# Patient Record
Sex: Female | Born: 1955 | Race: White | Hispanic: No | Marital: Single | Smoking: Current every day smoker
Health system: Southern US, Community
[De-identification: ages and names within clinical notes are randomized; demographics above are authoritative.]

## PROBLEM LIST (undated history)

## (undated) DIAGNOSIS — I10 Essential (primary) hypertension: Secondary | ICD-10-CM

## (undated) DIAGNOSIS — G8929 Other chronic pain: Secondary | ICD-10-CM

## (undated) DIAGNOSIS — M48062 Spinal stenosis, lumbar region with neurogenic claudication: Secondary | ICD-10-CM

## (undated) DIAGNOSIS — F17200 Nicotine dependence, unspecified, uncomplicated: Secondary | ICD-10-CM

## (undated) DIAGNOSIS — R0600 Dyspnea, unspecified: Secondary | ICD-10-CM

## (undated) DIAGNOSIS — J45909 Unspecified asthma, uncomplicated: Secondary | ICD-10-CM

## (undated) DIAGNOSIS — M4180 Other forms of scoliosis, site unspecified: Secondary | ICD-10-CM

## (undated) DIAGNOSIS — M51369 Other intervertebral disc degeneration, lumbar region without mention of lumbar back pain or lower extremity pain: Secondary | ICD-10-CM

## (undated) DIAGNOSIS — G8918 Other acute postprocedural pain: Secondary | ICD-10-CM

## (undated) DIAGNOSIS — M5136 Other intervertebral disc degeneration, lumbar region: Secondary | ICD-10-CM

## (undated) DIAGNOSIS — M1990 Unspecified osteoarthritis, unspecified site: Secondary | ICD-10-CM

## (undated) DIAGNOSIS — R252 Cramp and spasm: Secondary | ICD-10-CM

## (undated) DIAGNOSIS — Z9889 Other specified postprocedural states: Secondary | ICD-10-CM

## (undated) DIAGNOSIS — K219 Gastro-esophageal reflux disease without esophagitis: Secondary | ICD-10-CM

## (undated) DIAGNOSIS — J449 Chronic obstructive pulmonary disease, unspecified: Secondary | ICD-10-CM

## (undated) DIAGNOSIS — F419 Anxiety disorder, unspecified: Secondary | ICD-10-CM

## (undated) DIAGNOSIS — M5416 Radiculopathy, lumbar region: Secondary | ICD-10-CM

## (undated) DIAGNOSIS — M47816 Spondylosis without myelopathy or radiculopathy, lumbar region: Secondary | ICD-10-CM

## (undated) DIAGNOSIS — D649 Anemia, unspecified: Secondary | ICD-10-CM

## (undated) HISTORY — PX: OTHER SURGICAL HISTORY: SHX169

## (undated) HISTORY — PX: COLONOSCOPY: SHX174

## (undated) HISTORY — PX: FOOT SURGERY: SHX648

## (undated) HISTORY — PX: ABDOMINAL HYSTERECTOMY: SHX81

## (undated) HISTORY — PX: ESOPHAGOGASTRODUODENOSCOPY: SHX1529

## (undated) HISTORY — PX: HERNIA REPAIR: SHX51

## (undated) HISTORY — PX: DENTAL SURGERY: SHX609

## (undated) HISTORY — DX: Anxiety disorder, unspecified: F41.9

## (undated) HISTORY — PX: ROTATOR CUFF REPAIR: SHX139

---

## 2008-04-08 ENCOUNTER — Emergency Department: Payer: Self-pay | Admitting: Emergency Medicine

## 2009-05-11 ENCOUNTER — Emergency Department: Payer: Self-pay | Admitting: Unknown Physician Specialty

## 2009-09-01 ENCOUNTER — Inpatient Hospital Stay: Payer: Self-pay | Admitting: Internal Medicine

## 2009-09-19 ENCOUNTER — Ambulatory Visit: Payer: Self-pay | Admitting: Family Medicine

## 2011-10-20 ENCOUNTER — Emergency Department: Payer: Self-pay | Admitting: Emergency Medicine

## 2012-09-22 ENCOUNTER — Emergency Department: Payer: Self-pay | Admitting: Internal Medicine

## 2012-09-22 ENCOUNTER — Emergency Department: Payer: Self-pay | Admitting: Emergency Medicine

## 2012-09-22 LAB — CBC
HCT: 39.5 % (ref 35.0–47.0)
Platelet: 249 10*3/uL (ref 150–440)
RBC: 3.98 10*6/uL (ref 3.80–5.20)
WBC: 7.3 10*3/uL (ref 3.6–11.0)

## 2012-09-22 LAB — BASIC METABOLIC PANEL
Anion Gap: 9 (ref 7–16)
Co2: 24 mmol/L (ref 21–32)
Creatinine: 0.69 mg/dL (ref 0.60–1.30)
EGFR (Non-African Amer.): >60
Glucose: 89 mg/dL (ref 65–99)
Potassium: 3.9 mmol/L (ref 3.5–5.1)
Sodium: 146 mmol/L — ABNORMAL HIGH (ref 136–145)

## 2012-09-22 LAB — CK TOTAL AND CKMB (NOT AT ARMC): CK-MB: 1.3 ng/mL (ref 0.5–3.6)

## 2012-09-22 LAB — TROPONIN I: Troponin-I: <0.02 ng/mL

## 2012-10-12 ENCOUNTER — Inpatient Hospital Stay: Payer: Self-pay | Admitting: Internal Medicine

## 2012-10-12 LAB — CBC WITH DIFFERENTIAL/PLATELET
Basophil #: 0.1 10*3/uL (ref 0.0–0.1)
Eosinophil #: 0 10*3/uL (ref 0.0–0.7)
Eosinophil %: 0.3 %
HCT: 39.3 % (ref 35.0–47.0)
HGB: 13.2 g/dL (ref 12.0–16.0)
Lymphocyte #: 1.6 10*3/uL (ref 1.0–3.6)
Lymphocyte %: 11.1 %
MCHC: 33.7 g/dL (ref 32.0–36.0)
MCV: 100 fL (ref 80–100)
Monocyte %: 6.2 %
Neutrophil #: 11.9 10*3/uL — ABNORMAL HIGH (ref 1.4–6.5)
Neutrophil %: 81.7 %
Platelet: 257 10*3/uL (ref 150–440)
RBC: 3.95 10*6/uL (ref 3.80–5.20)
RDW: 12.7 % (ref 11.5–14.5)
WBC: 14.6 10*3/uL — ABNORMAL HIGH (ref 3.6–11.0)

## 2012-10-12 LAB — BASIC METABOLIC PANEL
Anion Gap: 7 (ref 7–16)
BUN: 12 mg/dL (ref 7–18)
Creatinine: 0.76 mg/dL (ref 0.60–1.30)
EGFR (Non-African Amer.): >60
Glucose: 93 mg/dL (ref 65–99)
Osmolality: 279 (ref 275–301)
Potassium: 3.6 mmol/L (ref 3.5–5.1)
Sodium: 140 mmol/L (ref 136–145)

## 2012-10-12 LAB — URINALYSIS, COMPLETE
Bacteria: NONE SEEN
Bilirubin,UR: NEGATIVE
Ph: 5 (ref 4.5–8.0)
WBC UR: 1 /HPF (ref 0–5)

## 2012-10-13 LAB — CBC WITH DIFFERENTIAL/PLATELET
Basophil #: 0 10*3/uL (ref 0.0–0.1)
Eosinophil #: 0 10*3/uL (ref 0.0–0.7)
Eosinophil %: 0 %
HGB: 12.2 g/dL (ref 12.0–16.0)
Lymphocyte #: 0.6 10*3/uL — ABNORMAL LOW (ref 1.0–3.6)
MCHC: 32.5 g/dL (ref 32.0–36.0)
Monocyte #: 0.7 x10 3/mm (ref 0.2–0.9)
Neutrophil #: 9.7 10*3/uL — ABNORMAL HIGH (ref 1.4–6.5)
RBC: 3.69 10*6/uL — ABNORMAL LOW (ref 3.80–5.20)
WBC: 11.1 10*3/uL — ABNORMAL HIGH (ref 3.6–11.0)

## 2012-10-13 LAB — BASIC METABOLIC PANEL
Calcium, Total: 9.4 mg/dL (ref 8.5–10.1)
Chloride: 110 mmol/L — ABNORMAL HIGH (ref 98–107)
Co2: 23 mmol/L (ref 21–32)
Creatinine: 0.66 mg/dL (ref 0.60–1.30)
EGFR (African American): >60
Potassium: 4 mmol/L (ref 3.5–5.1)
Sodium: 142 mmol/L (ref 136–145)

## 2013-02-07 ENCOUNTER — Ambulatory Visit: Payer: Self-pay

## 2013-02-15 ENCOUNTER — Ambulatory Visit: Payer: Self-pay

## 2014-03-09 ENCOUNTER — Ambulatory Visit: Payer: Self-pay | Admitting: Family Medicine

## 2014-09-12 ENCOUNTER — Emergency Department: Payer: Self-pay | Admitting: Emergency Medicine

## 2014-09-12 LAB — BASIC METABOLIC PANEL
Anion Gap: 8 (ref 7–16)
BUN: 11 mg/dL (ref 7–18)
CHLORIDE: 108 mmol/L — AB (ref 98–107)
CO2: 27 mmol/L (ref 21–32)
Calcium, Total: 8.7 mg/dL (ref 8.5–10.1)
Creatinine: 0.69 mg/dL (ref 0.60–1.30)
EGFR (African American): >60
GLUCOSE: 87 mg/dL (ref 65–99)
Osmolality: 284 (ref 275–301)
Potassium: 3.9 mmol/L (ref 3.5–5.1)
Sodium: 143 mmol/L (ref 136–145)

## 2014-09-12 LAB — CBC
HCT: 39.6 % (ref 35.0–47.0)
HGB: 12.9 g/dL (ref 12.0–16.0)
MCH: 31.7 pg (ref 26.0–34.0)
MCHC: 32.5 g/dL (ref 32.0–36.0)
MCV: 98 fL (ref 80–100)
Platelet: 237 10*3/uL (ref 150–440)
RBC: 4.05 10*6/uL (ref 3.80–5.20)
RDW: 13.5 % (ref 11.5–14.5)
WBC: 6.6 10*3/uL (ref 3.6–11.0)

## 2014-09-12 LAB — TROPONIN I
Troponin-I: <0.02 ng/mL
Troponin-I: <0.02 ng/mL

## 2015-03-19 NOTE — Discharge Summary (Signed)
PATIENT NAME:  Sandra Strong, Sandra Strong MR#:  [id number] DATE OF BIRTH:  Apr 04, 1956  DATE OF ADMISSION:  10/12/2012 DATE OF DISCHARGE:  10/15/2012  PRIMARY CARE PHYSICIAN: [facility], Dr. Greggory StallionGeorge   FINAL DIAGNOSES:  1. Chronic obstructive pulmonary disease exacerbation.  2. Pneumonia.  3. Tobacco abuse.  4. Gastroesophageal reflux disease.  5. Allergies.  6. Anxiety.   MEDICATIONS ON DISCHARGE:  1. Acetaminophen 500 mg 2 tablets every six hours as needed for pain. 2. Combivent 2 puffs 4 times a day. 3. Flonase 50 mcg/inhalation two sprays once a day. 4. Mobic 7.5 mg daily.  5. Omeprazole 40 mg at bedtime.  6. Zoloft 100 mg at bedtime.  7. Caffeine 200 mg twice a day as needed to stay awake.  8. Tramadol 50 mg 1 to 2 tablets every 6 to 8 hours as needed for pain.  9. Cranberry 1 capsule twice a day. 10. Albuterol nebulizer 3 mL every six hours as needed for shortness of breath.  11. Loratadine 10 mg daily.  12. Methocarbamol 500 mg every eight hours as needed for spasms.  13. Prednisone taper 5 mg 4 tablets day one, 3 tablets day two and three, 2 tablets day four and five, 1 tablet day six and seven. 14. Levaquin 750 mg every 24 hours for five more days.  15. Flovent 110 mcg 1 inhalation twice a day.  16. Nicotine patch 14 mg transdermal patch daily.   DIET: Low sodium diet, regular consistency.   ACTIVITY: Activity as tolerated.   FOLLOW-UP: [facility] to [facility], Dr. Greggory StallionGeorge.   REASON FOR ADMISSION: The patient was admitted 10/12/2012 and discharged 10/15/2012, came in with shortness of breath and cough.   HISTORY OF PRESENT ILLNESS: The patient is a [age] year old female who presented with shortness of breath and cough for the past few weeks. She is on prednisone taper and doxycycline. She was found to have an upper lobe pneumonia. The patient was placed on IV steroids and IV Levaquin.   LABORATORY AND RADIOLOGICAL DATA DURING THE HOSPITAL COURSE:  Blood cultures negative for 48 hours. Urine culture mixed bacterial organisms. Urinalysis trace leukocyte esterase. White blood cell count 14.6, hemoglobin and hematocrit 13.2 and 39.3, platelet count 257, glucose 93, BUN 12, creatinine 0.76, sodium 140, potassium 3.6, chloride 108, CO2 25, calcium 9.2.   Chest x-ray showed left upper lobe pneumonia.   EKG showed sinus tachycardia, left atrial enlargement.   HOSPITAL COURSE PER PROBLEM LIST:  1. For the patient's COPD exacerbation and pneumonia, the patient was feeling much better and wanted to go home. On the day of discharge I still heard some wheezing in the lungs. She does have her usual Combivent at home and needed to add Flovent to complete a course of antibiotic with Levaquin and a prednisone taper. The care manager secondary to cost reasons filled the script for the Levaquin. The [facility]. At this point I think the Spiriva would be too expensive and she was kept on her Combivent. I did put the patient on Flovent and labeled the inhaler prior to going home.  2. Tobacco abuse. I think this is the major cause of her continued issue. She must stop smoking. Nicotine patch applied. Smoking cessation counseling done three minutes by me.  3. Gastroesophageal reflux disease. She is on omeprazole.  4. Allergies. She is on loratadine and Flonase.  5. Anxiety. She is on Zoloft.  TIME SPENT ON DISCHARGE: 35 minutes.   ____________________________ Herschell Dimes. Renae Gloss, MD rjw:drc D: 10/15/2012 14:49:30 ET T: 10/17/2012 10:50:12 ET JOB#: [id number]  cc: Herschell Dimes. Renae Gloss, MD, <Dictator> Angus Palms, MD Salley Scarlet MD ELECTRONICALLY SIGNED 10/31/2012 17:21

## 2015-03-19 NOTE — H&P (Signed)
PATIENT NAME:  Sandra Strong, [place] MR#:  [id number] DATE OF BIRTH:  1956/02/12  DATE OF ADMISSION:  10/12/2012  PRIMARY CARE PHYSICIAN: Dr. Gregg[facility] StallionGeorge [facility]  CHIEF COMPLAINT: Shortness of breath and cough.   HISTORY OF PRESENT ILLNESS: This is a 59 year old female who presents to the hospital secondary to chronic obstructive pulmonary disease exacerbation and cough, shortness of breath ongoing for the past few weeks. Patient apparently has been having some exertional shortness of breath and a cough with productive sputum now ongoing for the past few weeks. She was started as an outpatient on a prednisone taper along with amoxicillin and doxycycline. Despite being on antibiotics and steroids her clinical symptoms have not improved and she continues to have worsening shortness of breath. She therefore came to the ER for further evaluation. She was noted to be significantly hypoxic and also was noted to be tachycardic even on minimal exertion. Chest x-ray revealed a left upper lobe pneumonia and she was noted to be in chronic obstructive pulmonary disease exacerbation. Hospitalist service contacted for further treatment and evaluation.   REVIEW OF SYSTEMS: CONSTITUTIONAL: No documented fever. No weight gain. No weight loss. EYES: No blurred or double vision. ENT: No tinnitus. Positive postnasal drip. No redness of the oropharynx. RESPIRATORY: Positive cough. Positive wheeze. No hemoptysis. Positive dyspnea on exertion. Positive chronic obstructive pulmonary disease. CARDIOVASCULAR: No chest pain, no orthopnea, no palpitations, no syncope. GASTROINTESTINAL: Positive nausea. No vomiting, no diarrhea, no abdominal pain, no melena, no hematochezia. GENITOURINARY: No dysuria, no hematuria. ENDOCRINE: No polyuria or nocturia. No heat or cold intolerance. HEME: No anemia, no bruising, no bleeding. INTEGUMENTARY: No rashes. No lesions. MUSCULOSKELETAL: No arthritis, no swelling, no gout. NEUROLOGIC: No  numbness, no tingling, no ataxia, no seizure-type activity. PSYCH: No anxiety, no insomnia, no ADD.   PAST MEDICAL HISTORY:  1. Gastroesophageal reflux disease.  2. Arthritis. 3. Chronic obstructive pulmonary disease with ongoing tobacco abuse.  4. Allergic rhinitis.   ALLERGIES: Codeine, erythromycin and sulfa.   SOCIAL HISTORY: Still smokes about a pack per day. Does have a 30 to 40 pack-year smoking history. No alcohol abuse. No illicit drug abuse. Lives at home by herself.   FAMILY HISTORY: Significant for malignancy. Her mother died from a malignancy that she is unheard of. Father had pneumonia and also died from colon cancer.   CURRENT MEDICATIONS:  1. Tylenol 500 mg 2 tabs q.6 hours as needed.  2. Albuterol nebulizer q.6 hours as needed  3. Combivent 2 puffs 4 times daily.  4. Caffeine 200 mg 1 tab b.i.d. as needed.  5. Cranberry oral capsule 1 tab b.i.d.  6. Flonase two sprays to each nostril daily. 7. Loratadine 10 mg daily.  8. Methocarbamol 500 mg q.8 hours as needed.  9. Mobic 7.5 mg daily.  10. Omeprazole 40 mg daily.  11. Prednisone 20 mg b.i.d. to be stopped on 10/14/2012. 12. Albuterol inhaler 2 puffs q.4-6h. as needed.  13. Zoloft 100 mg daily.  14. Tramadol 50 mg q.6-8 hours as needed.   PHYSICAL EXAMINATION:   VITAL SIGNS: Patient's vital signs are noted to be: Temperature 98.3, pulse 118, respirations 24, blood pressure 156/87, sats 95% on 2 liters nasal cannula.   GENERAL: She is a pleasant appearing female in mild respiratory distress.   HEENT: She is atraumatic, normocephalic. Her extraocular muscles are intact. Pupils equal, reactive to light. Sclerae anicteric. No conjunctival injection. No pharyngeal erythema.   NECK: Supple. There is no jugular venous distention, no  bruits, no lymphadenopathy, no thyromegaly.   HEART: Regular rate and rhythm. Tachycardic. No murmurs, no rubs, no clicks.   LUNGS: She has diffuse inspiratory and expiratory wheezes.  Positive use of accessory muscles. No dullness to percussion.   ABDOMEN: Soft, flat, nontender, nondistended. Has good bowel sounds. No hepatosplenomegaly appreciated.   EXTREMITIES: No evidence of any cyanosis, clubbing, or peripheral edema. Has +2 pedal and radial pulses bilaterally.   NEUROLOGICAL: Patient is alert, awake, oriented x3 with no focal motor or sensory deficits appreciated bilaterally.   SKIN: Moist, warm with no rash appreciated.   LYMPHATIC: There is no cervical or axillary lymphadenopathy.   LABORATORY, DIAGNOSTIC, AND RADIOLOGICAL DATA: Serum glucose 93, BUN 12, creatinine 0.7, sodium 140, potassium 3.6, chloride 108, bicarbonate 25, white cell count 14.6, hemoglobin 13.2, hematocrit 39.3, platelet count 257. Urinalysis is within normal limits.   Patient did have a chest x-ray done which showed left upper lobe infiltrate consistent with pneumonia with some mild underlying edema.   ASSESSMENT AND PLAN: This is a 59 year old female with a history of chronic obstructive pulmonary disease with ongoing tobacco abuse, gastroesophageal reflux disease, arthritis, history of allergic rhinitis presents to the hospital due to shortness of breath, cough and noted to be in chronic obstructive pulmonary disease exacerbation with underlying pneumonia having failed outpatient therapy.  1. Chronic obstructive pulmonary disease exacerbation. This is likely secondary to the left upper lobe pneumonia and has not improved with outpatient therapy with p.o. doxycycline, amoxicillin and prednisone taper. I will start her on IV steroids, put her on DuoNebs around-the-clock, add some Spiriva, continue IV Levaquin for pneumonia and follow her clinically. Will also assess her for home oxygen prior to discharge.  2. Pneumonia. This is likely community-acquired pneumonia. Has failed outpatient therapy with p.o. amoxicillin and doxycycline. Will start her on IV Levaquin, follow sputum and blood cultures.   3. Gastroesophageal reflux disease. Continue omeprazole. 4. Arthritis. Continue Mobic and tramadol.  5. Leukocytosis, likely secondary to the pneumonia, also complicated with underlying use of steroids. Will follow her white cell count with treatment with IV antibiotics.  6. Tobacco abuse. Place her on a nicotine patch.  7. CODE STATUS: Patient is a FULL CODE.   TIME SPENT WITH ADMISSION: 50 minutes.  ____________________________ Rolly Pancake. Cherlynn Kaiser, MD vjs:cms D: 10/12/2012 10:30:33 ET T: 10/12/2012 10:49:18 ET JOB#: [id number]  cc: Rolly Pancake. Cherlynn Kaiser, MD, <Dictator> Angus Palms, MD [place] Siren MD ELECTRONICALLY SIGNED 10/13/2012 8:12

## 2016-02-24 ENCOUNTER — Other Ambulatory Visit: Payer: Self-pay | Admitting: Family Medicine

## 2016-02-24 DIAGNOSIS — Z1231 Encounter for screening mammogram for malignant neoplasm of breast: Secondary | ICD-10-CM

## 2016-03-19 ENCOUNTER — Ambulatory Visit
Admission: RE | Admit: 2016-03-19 | Discharge: 2016-03-19 | Disposition: A | Discharge status: Home / Self Care | Payer: Managed Care, Other (non HMO) | Source: Ambulatory Visit | Attending: Family Medicine | Admitting: Family Medicine

## 2016-03-19 DIAGNOSIS — Z1231 Encounter for screening mammogram for malignant neoplasm of breast: Secondary | ICD-10-CM | POA: Diagnosis present

## 2016-03-26 ENCOUNTER — Other Ambulatory Visit: Payer: Self-pay | Admitting: Family Medicine

## 2016-03-26 DIAGNOSIS — R928 Other abnormal and inconclusive findings on diagnostic imaging of breast: Secondary | ICD-10-CM

## 2016-04-07 ENCOUNTER — Ambulatory Visit
Admission: RE | Admit: 2016-04-07 | Discharge: 2016-04-07 | Disposition: A | Discharge status: Home / Self Care | Payer: Managed Care, Other (non HMO) | Source: Ambulatory Visit | Attending: Family Medicine | Admitting: Family Medicine

## 2016-04-07 DIAGNOSIS — R928 Other abnormal and inconclusive findings on diagnostic imaging of breast: Secondary | ICD-10-CM | POA: Diagnosis not present

## 2017-03-20 ENCOUNTER — Emergency Department
Admission: EM | Admit: 2017-03-20 | Discharge: 2017-03-20 | Disposition: A | Discharge status: Home / Self Care | Payer: 59 | Attending: Emergency Medicine | Admitting: Emergency Medicine

## 2017-03-20 ENCOUNTER — Encounter: Payer: Self-pay | Admitting: Emergency Medicine

## 2017-03-20 ENCOUNTER — Emergency Department: Payer: 59

## 2017-03-20 DIAGNOSIS — J441 Chronic obstructive pulmonary disease with (acute) exacerbation: Secondary | ICD-10-CM

## 2017-03-20 DIAGNOSIS — J45909 Unspecified asthma, uncomplicated: Secondary | ICD-10-CM | POA: Diagnosis not present

## 2017-03-20 DIAGNOSIS — F17200 Nicotine dependence, unspecified, uncomplicated: Secondary | ICD-10-CM | POA: Diagnosis not present

## 2017-03-20 DIAGNOSIS — J181 Lobar pneumonia, unspecified organism: Secondary | ICD-10-CM | POA: Diagnosis not present

## 2017-03-20 DIAGNOSIS — I10 Essential (primary) hypertension: Secondary | ICD-10-CM | POA: Insufficient documentation

## 2017-03-20 DIAGNOSIS — R0602 Shortness of breath: Secondary | ICD-10-CM | POA: Diagnosis present

## 2017-03-20 DIAGNOSIS — J189 Pneumonia, unspecified organism: Secondary | ICD-10-CM

## 2017-03-20 HISTORY — DX: Chronic obstructive pulmonary disease, unspecified: J44.9

## 2017-03-20 HISTORY — DX: Gastro-esophageal reflux disease without esophagitis: K21.9

## 2017-03-20 HISTORY — DX: Essential (primary) hypertension: I10

## 2017-03-20 HISTORY — DX: Unspecified osteoarthritis, unspecified site: M19.90

## 2017-03-20 HISTORY — DX: Unspecified asthma, uncomplicated: J45.909

## 2017-03-20 LAB — BASIC METABOLIC PANEL
ANION GAP: 10 (ref 5–15)
BUN: 14 mg/dL (ref 6–20)
CALCIUM: 9.6 mg/dL (ref 8.9–10.3)
CO2: 25 mmol/L (ref 22–32)
Chloride: 105 mmol/L (ref 101–111)
Creatinine, Ser: 0.66 mg/dL (ref 0.44–1.00)
Glucose, Bld: 98 mg/dL (ref 65–99)
Potassium: 3.2 mmol/L — ABNORMAL LOW (ref 3.5–5.1)
SODIUM: 140 mmol/L (ref 135–145)

## 2017-03-20 LAB — CBC
HEMATOCRIT: 39.9 % (ref 35.0–47.0)
HEMOGLOBIN: 13.4 g/dL (ref 12.0–16.0)
MCH: 31.3 pg (ref 26.0–34.0)
MCHC: 33.6 g/dL (ref 32.0–36.0)
MCV: 93 fL (ref 80.0–100.0)
Platelets: 330 10*3/uL (ref 150–440)
RBC: 4.29 MIL/uL (ref 3.80–5.20)
RDW: 13.6 % (ref 11.5–14.5)
WBC: 18 10*3/uL — AB (ref 3.6–11.0)

## 2017-03-20 LAB — TROPONIN I

## 2017-03-20 MED ORDER — LEVOFLOXACIN 500 MG PO TABS: 500.0000 mg | ORAL_TABLET | Freq: Every day | ORAL | 0 refills | Status: AC

## 2017-03-20 MED ORDER — ALBUTEROL SULFATE (2.5 MG/3ML) 0.083% IN NEBU
5.0000 mg | INHALATION_SOLUTION | Freq: Once | RESPIRATORY_TRACT | Status: AC
  Administered 2017-03-20: 5 mg via RESPIRATORY_TRACT
  Filled 2017-03-20: qty 6

## 2017-03-20 MED ORDER — IPRATROPIUM-ALBUTEROL 0.5-2.5 (3) MG/3ML IN SOLN
3.0000 mL | Freq: Once | RESPIRATORY_TRACT | Status: AC
Start: 2017-03-20 — End: 2017-03-20
  Administered 2017-03-20: 3 mL via RESPIRATORY_TRACT
  Filled 2017-03-20: qty 3

## 2017-03-20 MED ORDER — LEVOFLOXACIN 500 MG PO TABS
500.0000 mg | ORAL_TABLET | Freq: Once | ORAL | Status: AC
  Administered 2017-03-20: 500 mg via ORAL
  Filled 2017-03-20: qty 1

## 2017-03-20 NOTE — ED Provider Notes (Signed)
[facility] Emergency Department Provider Note   ____________________________________________    I have reviewed the triage vital signs and the nursing notes.   HISTORY  Chief Complaint Shortness of Breath     HPI Sandra Strong is a 61 y.o. female who presents with complaints of shortness of breath. Patient reports 2 weeks of soreness of breath and cough. No recent travel. No chest pain. Recently completed course of prednisone started by her PCP. No improvement. No fevers reported no chest pain. Patient does smoke cigarettes   Past Medical History:  Diagnosis Date  . Arthritis   . Asthma   . COPD (chronic obstructive pulmonary disease) (HCC)   . GERD (gastroesophageal reflux disease)   . Hypertension     There are no active problems to display for this patient.   History reviewed. No pertinent surgical history.  Prior to Admission medications   Medication Sig Start Date End Date Taking? Authorizing Provider  levofloxacin (LEVAQUIN) 500 MG tablet Take 1 tablet (500 mg total) by mouth daily. 03/20/17 03/30/17  Jene Every, MD     Allergies Erythromycin and Sulfa antibiotics  Family History  Problem Relation Age of Onset  . Breast cancer Mother 86  . Breast cancer Paternal Aunt     Social History Social History  Substance Use Topics  . Smoking status: Current Every Day Smoker  . Smokeless tobacco: Never Used  . Alcohol use No    Review of Systems  Constitutional: No fever/chills  Cardiovascular: Denies chest pain. Respiratory: As above Gastrointestinal: No abdominal pain.   Musculoskeletal: Negative for back pain. Marland Kitchen   10-point ROS otherwise negative.  ____________________________________________   PHYSICAL EXAM:  VITAL SIGNS: ED Triage Vitals [03/20/17 0617]  Enc Vitals Group     BP (!) 145/77     Pulse Rate 99     Resp (!) 24     Temp 98.2 F (36.8 C)     Temp Source Oral     SpO2 94 %     Weight 195 lb  (88.5 kg)     Height  (1.575 m)     Head Circumference      Peak Flow      Pain Score 2     Pain Loc      Pain Edu?      Excl. in GC?     Constitutional: Alert and oriented. No acute distress. Pleasant and interactive Eyes: Conjunctivae are normal.   Nose: No congestion/rhinnorhea. Mouth/Throat: Mucous membranes are moist.    Cardiovascular: Normal rate, regular rhythm. Grossly normal heart sounds.  Good peripheral circulation. Respiratory: Normal respiratory effort.  No retractions. Mild scattered wheezes  Genitourinary: deferred Musculoskeletal: No lower extremity tenderness nor edema.  Warm and well perfused Neurologic:  Normal speech and language. No gross focal neurologic deficits are appreciated.  Skin:  Skin is warm, dry and intact. No rash noted. Psychiatric: Mood and affect are normal. Speech and behavior are normal.  ____________________________________________   LABS (all labs ordered are listed, but only abnormal results are displayed)  Labs Reviewed  BASIC METABOLIC PANEL - Abnormal; Notable for the following:       Result Value   Potassium 3.2 (*)    All other components within normal limits  CBC - Abnormal; Notable for the following:    WBC 18.0 (*)    All other components within normal limits  TROPONIN I   ____________________________________________  EKG  ED ECG REPORT I,  Jene Every, the attending physician, personally viewed and interpreted this ECG.  Date: 03/20/2017 EKG Time: 6:14 AM Rate: 91 Rhythm: normal sinus rhythm QRS Axis: normal Intervals: normal ST/T Wave abnormalities: normal Conduction Disturbances: none Narrative Interpretation: unremarkable  ____________________________________________  RADIOLOGY  Chest x-ray demonstrates likely lingular infiltrate ____________________________________________   PROCEDURES  Procedure(s) performed: No    Critical Care  performed:No ____________________________________________   INITIAL IMPRESSION / ASSESSMENT AND PLAN / ED COURSE  Pertinent labs & imaging results that were available during my care of the patient were reviewed by me and considered in my medical decision making (see chart for details).  Patient presents with shortness of breath and cough. History of COPD, recently finished prednisone but no improvement. Given DuoNeb in the emergency department with significant relief. Elevated white blood cell count likely related to prednisone course.  ----------------------------------------- 8:00 AM on 03/20/2017 -----------------------------------------  Chest x-ray demonstrates possible infiltrate in the lingula. However patient feels significantly better and is very well-appearing. She is quite ready to go home. We will treat her with Levaquin by mouth, she is to return if any worsening symptoms    ____________________________________________   FINAL CLINICAL IMPRESSION(S) / ED DIAGNOSES  Final diagnoses:  COPD exacerbation (HCC)  Community acquired pneumonia of left lower lobe of lung (HCC)      NEW MEDICATIONS STARTED DURING THIS VISIT:  New Prescriptions   LEVOFLOXACIN (LEVAQUIN) 500 MG TABLET    Take 1 tablet (500 mg total) by mouth daily.     Note:  This document was prepared using Dragon voice recognition software and may include unintentional dictation errors.    Jene Every, MD 03/20/17 [phone]

## 2017-03-20 NOTE — ED Triage Notes (Signed)
Pt to triage via WC, reports SOB x 2 weeks, hx copd/asthma, has been using inhaler and nebulizer at home w/o relief.  Pt reports she just finished course of prednisone w/o improvement.

## 2017-04-14 ENCOUNTER — Ambulatory Visit: Payer: 59

## 2017-05-08 IMAGING — US US BREAST*R* LIMITED INC AXILLA
1 series · 2 of 2 positions shown · non-contrast
Comparison: Previous exam(s).

CLINICAL DATA: Patient recalled from screening for left breast
asymmetry and right axillary lymph node.

EXAM:
DIGITAL DIAGNOSTIC BILATERAL MAMMOGRAM WITH CAD
ULTRASOUND RIGHT BREAST

[Series 1: us breast*right* limited inc axilla · 0.07mm/px · 2 of 2 slices shown]
[im 1/2]
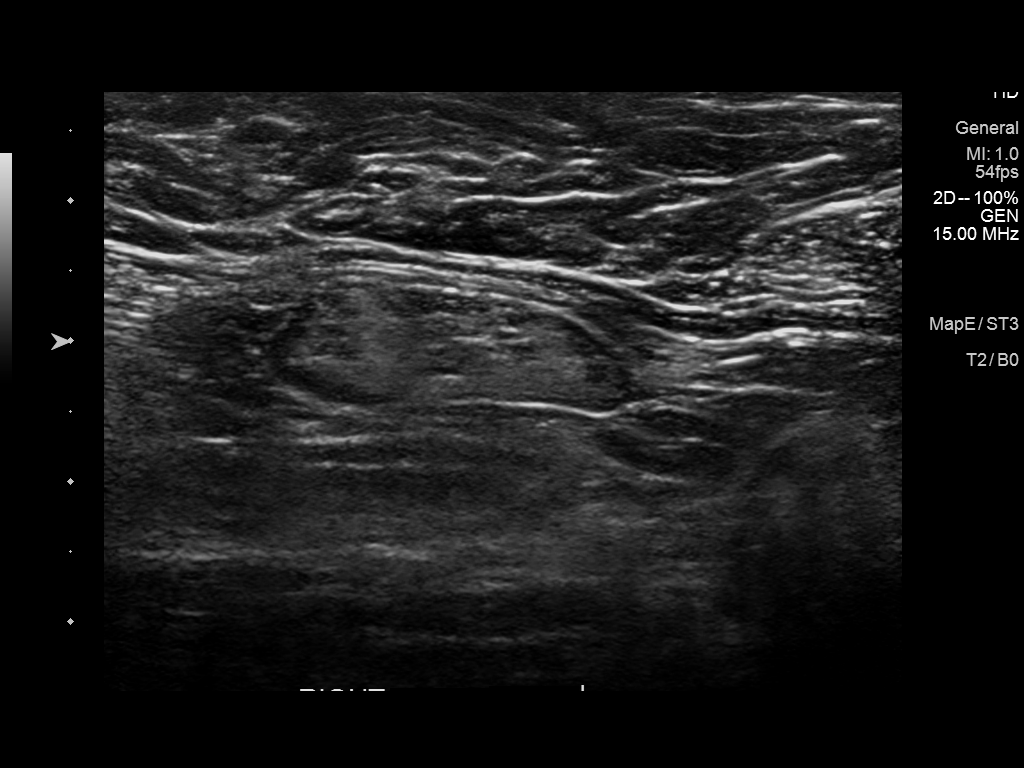
[im 2/2]
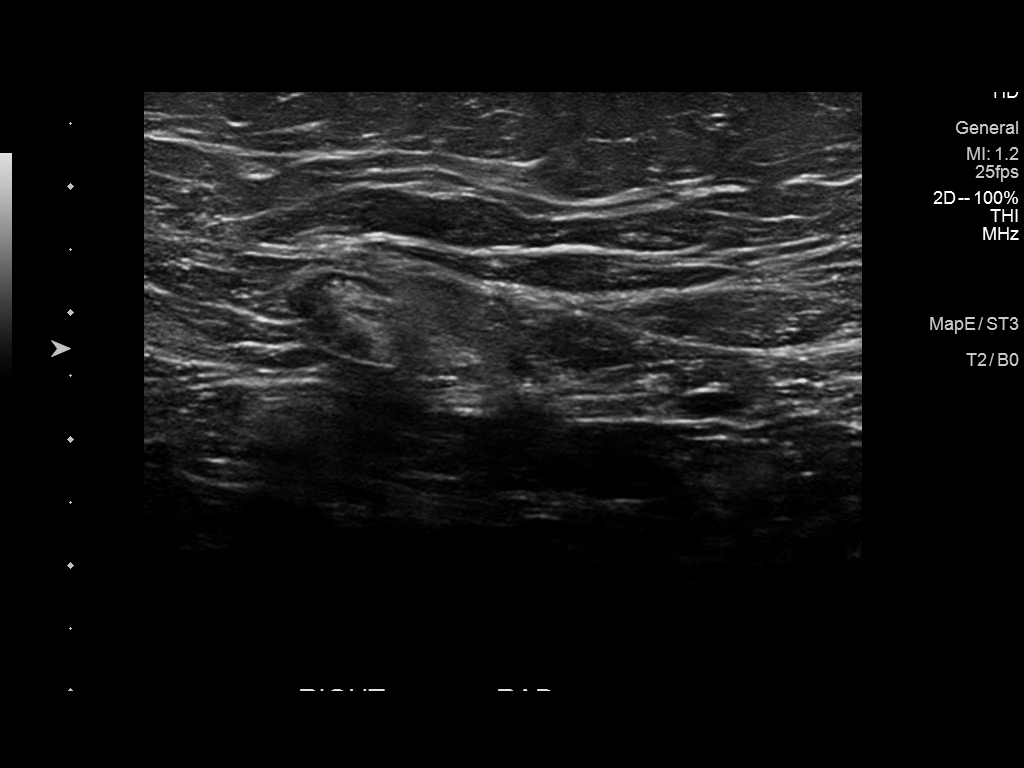

[2 of 2 positions shown; findings below may reference images not displayed]

ACR Breast Density Category b: There are scattered areas of
fibroglandular density.
FINDINGS: CC and MLO tomosynthesis images of the right and left breast were
obtained. The questioned asymmetry within the lateral left breast
resolved compatible with overlapping fibroglandular breast tissue.
Normal appearing right axillary lymph nodes with fatty hila were
demonstrated. No suspicious masses identified in either breast.

Mammographic images were processed with CAD.

On physical exam, I palpate no discrete mass within the right
axilla.

Targeted ultrasound is performed, showing normal right axillary
lymph nodes. No abnormal cortical thickening.
IMPRESSION: No mammographic evidence for malignancy within the right or left
breast. No right axillary adenopathy.

RECOMMENDATION:
Screening mammogram in one year.(Code:JW-[id number])

I have discussed the findings and recommendations with the patient.
Results were also provided in writing at the conclusion of the
visit. If applicable, a reminder letter will be sent to the patient
regarding the next appointment.

BI-RADS CATEGORY  1: Negative.

## 2017-05-12 ENCOUNTER — Encounter: Payer: Self-pay | Admitting: *Deleted

## 2017-05-12 ENCOUNTER — Ambulatory Visit
Admission: RE | Admit: 2017-05-12 | Discharge: 2017-05-12 | Disposition: A | Discharge status: Home / Self Care | Payer: 59 | Source: Ambulatory Visit | Attending: Oncology | Admitting: Oncology

## 2017-05-12 ENCOUNTER — Ambulatory Visit: Payer: 59 | Attending: Oncology | Admitting: *Deleted

## 2017-05-12 VITALS — BP 129/84 | HR 76 | Temp 97.1°F | Ht 62.0 in | Wt 197.0 lb

## 2017-05-12 DIAGNOSIS — Z0000 Encounter for general adult medical examination without abnormal findings: Secondary | ICD-10-CM

## 2017-05-12 NOTE — Patient Instructions (Signed)
Gave patient hand-out, Women Staying Healthy, Active and Well from [facility], with education on breast health, pap smears, heart and colon health. 

## 2017-05-12 NOTE — Progress Notes (Signed)
Subjective:     Patient ID: Sandra Strong, female   DOB: 05/22/1956, 61 y.o.   MRN: [id number]  HPI   Review of Systems     Objective:   Physical Exam  Pulmonary/Chest: Right breast exhibits no inverted nipple, no mass, no nipple discharge, no skin change and no tenderness. Left breast exhibits no inverted nipple, no mass, no nipple discharge, no skin change and no tenderness. Breasts are symmetrical.       Assessment:     61 year old White [facility].  Clinical breast exam unremarkable.  Taught self breast awareness.  Patient has a history of hysterectomy for post delivery problems.  States she has one ovary.  Patient refused pelvic exam today.  States she will follow-up with a gyn if she has "any bleeding, pain or other things".  Patient has difficulty ambulating.  States she has arthritis. Patient states she does have insurance, but it does not pay for a mammogram.  She meets financial eligibility for [facility].  Will include her insurance for initial payment and [facility] as secondary.    Plan:     Screening mammogram ordered.  Will follow-up per [facility] protocol.

## 2017-05-13 ENCOUNTER — Encounter: Payer: Self-pay | Admitting: *Deleted

## 2017-05-13 NOTE — Progress Notes (Signed)
Letter mailed from the [facility] to inform patient of her normal mammogram results.  Patient is to follow-up with annual screening in one year.  HSIS to Christy. 

## 2017-10-28 ENCOUNTER — Telehealth: Payer: Self-pay | Admitting: *Deleted

## 2017-10-28 DIAGNOSIS — Z87891 Personal history of nicotine dependence: Secondary | ICD-10-CM

## 2017-10-28 DIAGNOSIS — Z122 Encounter for screening for malignant neoplasm of respiratory organs: Secondary | ICD-10-CM

## 2017-10-28 NOTE — Telephone Encounter (Signed)
Received referral for low dose lung cancer screening CT scan. Message left at phone number listed in EMR for patient to call me back to facilitate scheduling scan.  

## 2017-10-28 NOTE — Telephone Encounter (Signed)
Received referral for initial lung cancer screening scan. Contacted patient and obtained smoking history,(current, 129 pack year) as well as answering questions related to screening process. Patient denies signs of lung cancer such as weight loss or hemoptysis. Patient denies comorbidity that would prevent curative treatment if lung cancer were found. Patient is scheduled for shared decision making visit and CT scan on 11/10/17.

## 2017-11-10 ENCOUNTER — Ambulatory Visit: Payer: 59

## 2017-11-10 ENCOUNTER — Encounter: Payer: Self-pay | Admitting: Oncology

## 2017-11-10 ENCOUNTER — Inpatient Hospital Stay: Payer: 59 | Admitting: Oncology

## 2017-12-03 ENCOUNTER — Telehealth: Payer: Self-pay

## 2017-12-03 ENCOUNTER — Telehealth: Payer: Self-pay | Admitting: *Deleted

## 2017-12-03 DIAGNOSIS — Z87891 Personal history of nicotine dependence: Secondary | ICD-10-CM

## 2017-12-03 DIAGNOSIS — Z122 Encounter for screening for malignant neoplasm of respiratory organs: Secondary | ICD-10-CM

## 2017-12-03 NOTE — Telephone Encounter (Signed)
Contacted patient regarding rescheduling lung screening patient is agreeable to reschedule on 12/28/17. See prior notes for eligibility information.

## 2017-12-03 NOTE — Telephone Encounter (Signed)
Copied from CRM [id number]. Topic: Inquiry >> Dec 03, 2017 11:06 AM Everardo Pacific[place], Kelly, NT wrote: Reason for CRM: Patient calling because [facility] that she needed to be seen in this office, but she has never been seen here before and patient doesn't want to move her care to our office. If someone could give her a call back at [phone] before 12. Has an appt for a CT Scan 12-28-2017

## 2017-12-24 ENCOUNTER — Other Ambulatory Visit: Payer: Self-pay | Admitting: Family Medicine

## 2017-12-24 DIAGNOSIS — Z780 Asymptomatic menopausal state: Secondary | ICD-10-CM

## 2017-12-28 ENCOUNTER — Inpatient Hospital Stay: Payer: 59 | Attending: Nurse Practitioner | Admitting: Nurse Practitioner

## 2017-12-28 ENCOUNTER — Ambulatory Visit
Admission: RE | Admit: 2017-12-28 | Discharge: 2017-12-28 | Disposition: A | Discharge status: Home / Self Care | Payer: 59 | Source: Ambulatory Visit | Attending: Nurse Practitioner | Admitting: Nurse Practitioner

## 2017-12-28 DIAGNOSIS — Z122 Encounter for screening for malignant neoplasm of respiratory organs: Secondary | ICD-10-CM | POA: Diagnosis not present

## 2017-12-28 DIAGNOSIS — J439 Emphysema, unspecified: Secondary | ICD-10-CM | POA: Diagnosis not present

## 2017-12-28 DIAGNOSIS — Z87891 Personal history of nicotine dependence: Secondary | ICD-10-CM

## 2017-12-28 NOTE — Progress Notes (Signed)
In accordance with CMS guidelines, patient has met eligibility criteria including age, absence of signs or symptoms of lung cancer.  Social History   Tobacco Use  . Smoking status: Current Every Day Smoker    Packs/day: 3.00    Years: 43.00    Pack years: 129.00    Start date: 05/12/1969  . Smokeless tobacco: Never Used  . Tobacco comment: Information on quiting smoking given to pt, 1.5ppd currrently  Substance Use Topics  . Alcohol use: No  . Drug use: Not on file     A shared decision-making session was conducted prior to the performance of CT scan. This includes one or more decision aids, includes benefits and harms of screening, follow-up diagnostic testing, over-diagnosis, false positive rate, and total radiation exposure.  Counseling on the importance of adherence to annual lung cancer LDCT screening, impact of co-morbidities, and ability or willingness to undergo diagnosis and treatment is imperative for compliance of the program.  Counseling on the importance of continued smoking cessation for former smokers; the importance of smoking cessation for current smokers, and information about tobacco cessation interventions have been given to patient including [place] and 1800 quit [place] programs.  Written order for lung cancer screening with LDCT has been given to the patient and any and all questions have been answered to the best of my abilities.   Yearly follow up will be coordinated by Burgess Estelle, Thoracic Navigator.  Beckey Rutter, DNP, AGNP-C [place] at [facility] [phone] (office) 12/28/17 3:01 PM

## 2017-12-31 ENCOUNTER — Telehealth: Payer: Self-pay | Admitting: *Deleted

## 2017-12-31 NOTE — Telephone Encounter (Signed)
Notified patient of LDCT lung cancer screening program results with recommendation for 12 month follow up imaging. Also notified of incidental findings noted below and is encouraged to discuss further with PCP who will receive a copy of this note and/or the CT report. Patient verbalizes understanding.   IMPRESSION: Lung-RADS 2, benign appearance or behavior. Continue annual screening with low-dose chest CT without contrast in 12 months 

## 2018-01-06 ENCOUNTER — Other Ambulatory Visit: Payer: Self-pay

## 2018-01-06 ENCOUNTER — Ambulatory Visit
Admission: RE | Admit: 2018-01-06 | Discharge: 2018-01-06 | Disposition: A | Discharge status: Home / Self Care | Payer: 59 | Source: Ambulatory Visit | Attending: Nurse Practitioner | Admitting: Nurse Practitioner

## 2018-01-06 ENCOUNTER — Encounter: Payer: Self-pay | Admitting: Nurse Practitioner

## 2018-01-06 ENCOUNTER — Ambulatory Visit: Payer: 59 | Admitting: Nurse Practitioner

## 2018-01-06 ENCOUNTER — Ambulatory Visit: Payer: 59 | Attending: Nurse Practitioner | Admitting: Nurse Practitioner

## 2018-01-06 DIAGNOSIS — J449 Chronic obstructive pulmonary disease, unspecified: Secondary | ICD-10-CM | POA: Insufficient documentation

## 2018-01-06 DIAGNOSIS — K219 Gastro-esophageal reflux disease without esophagitis: Secondary | ICD-10-CM | POA: Insufficient documentation

## 2018-01-06 DIAGNOSIS — M545 Low back pain, unspecified: Secondary | ICD-10-CM

## 2018-01-06 DIAGNOSIS — M1990 Unspecified osteoarthritis, unspecified site: Secondary | ICD-10-CM | POA: Insufficient documentation

## 2018-01-06 DIAGNOSIS — F1190 Opioid use, unspecified, uncomplicated: Secondary | ICD-10-CM | POA: Diagnosis not present

## 2018-01-06 DIAGNOSIS — Z79899 Other long term (current) drug therapy: Secondary | ICD-10-CM | POA: Insufficient documentation

## 2018-01-06 DIAGNOSIS — M419 Scoliosis, unspecified: Secondary | ICD-10-CM | POA: Diagnosis not present

## 2018-01-06 DIAGNOSIS — M25571 Pain in right ankle and joints of right foot: Secondary | ICD-10-CM | POA: Insufficient documentation

## 2018-01-06 DIAGNOSIS — G8929 Other chronic pain: Secondary | ICD-10-CM

## 2018-01-06 DIAGNOSIS — M5137 Other intervertebral disc degeneration, lumbosacral region: Secondary | ICD-10-CM | POA: Insufficient documentation

## 2018-01-06 DIAGNOSIS — M25572 Pain in left ankle and joints of left foot: Secondary | ICD-10-CM | POA: Insufficient documentation

## 2018-01-06 DIAGNOSIS — M19071 Primary osteoarthritis, right ankle and foot: Secondary | ICD-10-CM | POA: Diagnosis not present

## 2018-01-06 DIAGNOSIS — M7732 Calcaneal spur, left foot: Secondary | ICD-10-CM | POA: Diagnosis not present

## 2018-01-06 DIAGNOSIS — M25561 Pain in right knee: Secondary | ICD-10-CM

## 2018-01-06 DIAGNOSIS — M5136 Other intervertebral disc degeneration, lumbar region: Secondary | ICD-10-CM | POA: Diagnosis not present

## 2018-01-06 DIAGNOSIS — G894 Chronic pain syndrome: Secondary | ICD-10-CM | POA: Diagnosis not present

## 2018-01-06 DIAGNOSIS — Z789 Other specified health status: Secondary | ICD-10-CM | POA: Diagnosis not present

## 2018-01-06 DIAGNOSIS — I10 Essential (primary) hypertension: Secondary | ICD-10-CM | POA: Diagnosis not present

## 2018-01-06 DIAGNOSIS — M25562 Pain in left knee: Secondary | ICD-10-CM | POA: Insufficient documentation

## 2018-01-06 DIAGNOSIS — M899 Disorder of bone, unspecified: Secondary | ICD-10-CM | POA: Diagnosis not present

## 2018-01-06 NOTE — Progress Notes (Signed)
Patient's Name: Sandra Strong  MRN: [id number]  Referring Provider: Renata Caprice  DOB: Feb 16, 1956  PCP: [facility] [place]  DOS: 01/06/2018  Note by: Dionisio David NP  Service setting: Ambulatory outpatient  Specialty: Interventional Pain Management  Location: [facility] (AMB) Pain Management Facility    Patient type: New Patient    Primary Reason(s) for Visit: Initial Patient Evaluation CC: Back Pain (lower)  HPI  Sandra Strong is a 62 y.o. year old, female patient, who comes today for an initial evaluation. She has Chronic bilateral low back pain without sciatica (Primary Area of Pain)(B) (R>L); Chronic ankle pain, bilateral (Secondary Area of Pain) (R>L); Chronic pain of both knees (Tertiary Area of Pain) (L>R); Chronic pain syndrome; Opiate use; Pharmacologic therapy; Disorder of skeletal system; and Problems influencing health status on their problem list.. Her primarily concern today is the Back Pain (lower)  Pain Assessment: Location: Right, Left, Lower Back Radiating: groin, coccyx Onset: More than a month ago Duration: Chronic pain Quality: Aching(popping) Severity: 2 /10 (self-reported pain score)  Note: Reported level is compatible with observation.                          Effect on ADL:  Does well at home and working is the worst with the pain. Timing: Intermittent Modifying factors: sitting or lying down  Onset and Duration: Gradual Cause of pain: scoliosis Severity: Getting worse, NAS-11 at its worse: 10/10, NAS-11 at its best: 4/10, NAS-11 now: 10/10 and NAS-11 on the average: 5/10 Timing: During activity or exercise Aggravating Factors: Lifiting, Prolonged standing, Walking and Working Alleviating Factors: Lying down and Resting Associated Problems: Fatigue, Sweating, Swelling, Weakness and Pain that does not allow patient to sleep Quality of Pain: Aching, Pressure-like, Sharp and Throbbing Previous Examinations or Tests: X-rays and Orthopedic  evaluation Previous Treatments: The patient denies treatments  The patient comes into the clinics today for the first time for a chronic pain management evaluation. According to the patient her primary area of pain is in her lower back. She admits that the right side  greater than the left. She denies any previous injury.  She has been having pain for greater than 6 years. She denies any surgery. She has had bilateral SI joint injections Dr. [facility] January 2019. She admits that this was not effective. She has started physical therapy in the past , this was not effective. She has had recent images of SI joints.   She admits that her second area of pain is in her ankles. She denies any injury. She admits that she has OA in several joints.  She does feel like it is getting worse. She has swelling and some weakness. She denies any surgeries, interventional therapy , physical therapy or recent images.  Her third area of pain I in her knees She admits that the left knee is worse than the right.  She denies any previous surgery, interventional therapy or physical therapy. She admits that she has had recent images at [facility].  Today I took the time to provide the patient with information regarding this pain practice. The patient was informed that the practice is divided into two sections: an interventional pain management section, as well as a completely separate and distinct medication management section. I explained that there are procedure days for interventional therapies, and evaluation days for follow-ups and medication management. Because of the amount of documentation required during both, they  are kept separated. This means that there is the possibility that she may be scheduled for a procedure on one day, and medication management the next. I have also informed her that because of staffing and facility limitations, this practice will no longer take patients for medication  management only. To illustrate the reasons for this, I gave the patient the example of surgeons, and how inappropriate it would be to refer a patient to his/her care, just to write for the post-surgical antibiotics on a surgery done by a different surgeon.   Because interventional pain management is part of the board-certified specialty for the doctors, the patient was informed that joining this practice means that they are open to any and all interventional therapies. I made it clear that this does not mean that they will be forced to have any procedures done. What this means is that I believe interventional therapies to be essential part of the diagnosis and proper management of chronic pain conditions. Therefore, patients not interested in these interventional alternatives will be better served under the care of a different practitioner.  The patient was also made aware of my Comprehensive Pain Management Safety Guidelines where by joining this practice, they limit all of their nerve blocks and joint injections to those done by our practice, for as long as we are retained to manage their care. Historic Controlled Substance Pharmacotherapy Review  PMP and historical list of controlled substances: Zolpidem 10 mg, zolpidem 5 mg, tramadol 50 mg, acetaminophen codeine No. 3, lorazepam 1 mg, Tussionex suspension, hydrocodone/acetaminophen 5/325 mg, oxycodone/acetaminophen 5/325 mg, triazolam 0.25 mg Highest opioid analgesic regimen found: Oxycodone/acetaminophen 5/325 mg one tablet every 4 hours (fill date 10/21/2015) oxycodone 30 mg per day Most recent opioid analgesic: Acetaminophen codeine No. 3 one tablet every 4 hours (fill date 02/03/2017) Current opioid analgesics: Acetaminophen codeine No. 3 one tablet every 12 hours (fill date 02/03/2017) Highest recorded MME/day: 45 mg/day MME/day: 10 mg/day Medications: The patient did not bring the medication(s) to the appointment, as requested in our "New  Patient Package" Pharmacodynamics: Desired effects: Analgesia: The patient reports >50% benefit. Reported improvement in function: The patient reports medication allows her to accomplish basic ADLs. Clinically meaningful improvement in function (CMIF): Sustained CMIF goals met Perceived effectiveness: Described as relatively effective, allowing for increase in activities of daily living (ADL) Undesirable effects: Side-effects or Adverse reactions: None reported Historical Monitoring: The patient  has no drug history on file. List of all UDS Test(s): No results found for: MDMA, COCAINSCRNUR, PCPSCRNUR, PCPQUANT, CANNABQUANT, THCU, [place] List of all Serum Drug Screening Test(s):  No results found for: AMPHSCRSER, BARBSCRSER, BENZOSCRSER, COCAINSCRSER, PCPSCRSER, PCPQUANT, THCSCRSER, CANNABQUANT, OPIATESCRSER, OXYSCRSER, PROPOXSCRSER Historical Background Evaluation: [place] PDMP: Six (6) year initial data search conducted.             [place] Department of public safety, offender search: Editor, commissioning Information) Non-contributory Risk Assessment Profile: Aberrant behavior: None observed or detected today Risk factors for fatal opioid overdose: None identified today Fatal overdose hazard ratio (HR): Calculation deferred Non-fatal overdose hazard ratio (HR): Calculation deferred Risk of opioid abuse or dependence: 0.7-3.0% with doses ? 36 MME/day and 6.1-26% with doses ? 120 MME/day. Substance use disorder (SUD) risk level: Pending results of Medical Psychology Evaluation for SUD Opioid risk tool (ORT) (Total Score): 3  ORT Scoring interpretation table:  Score <3 = Low Risk for SUD  Score between 4-7 = Moderate Risk for SUD  Score >8 = High Risk for Opioid Abuse   PHQ-2 Depression  Scale:  Total score: 0  PHQ-2 Scoring interpretation table: (Score and probability of major depressive disorder)  Score 0 = No depression  Score 1 = 15.4% Probability  Score 2 = 21.1% Probability  Score 3 = 38.4%  Probability  Score 4 = 45.5% Probability  Score 5 = 56.4% Probability  Score 6 = 78.6% Probability   PHQ-9 Depression Scale:  Total score: 0  PHQ-9 Scoring interpretation table:  Score 0-4 = No depression  Score 5-9 = Mild depression  Score 10-14 = Moderate depression  Score 15-19 = Moderately severe depression  Score 20-27 = Severe depression (2.4 times higher risk of SUD and 2.89 times higher risk of overuse)   Pharmacologic Plan: Pending ordered tests and/or consults  Meds  The patient has a current medication list which includes the following prescription(s): acetaminophen, albuterol, albuterol, cetirizine, vitamin d3, etodolac, fluticasone, fluticasone, montelukast, omeprazole, probiotic product, sertraline, simvastatin, tiotropium, vitamin c, and zolpidem.  Current Outpatient Medications on File Prior to Visit  Medication Sig  . acetaminophen (TYLENOL) 500 MG tablet Take 500 mg by mouth every 6 (six) hours as needed. 2 tabs  . albuterol (PROVENTIL HFA;VENTOLIN HFA) 108 (90 Base) MCG/ACT inhaler Inhale 2 puffs into the lungs every 4 (four) hours as needed for wheezing or shortness of breath.  Marland Kitchen albuterol (PROVENTIL) (2.5 MG/3ML) 0.083% nebulizer solution Take 2.5 mg by nebulization every 6 (six) hours as needed for wheezing or shortness of breath.  . cetirizine (ZYRTEC) 10 MG tablet Take 10 mg by mouth daily.  . Cholecalciferol (VITAMIN D3) 2000 units TABS Take 2,000 Units by mouth daily.  Marland Kitchen etodolac (LODINE) 400 MG tablet Take 400 mg by mouth 2 (two) times daily.  . fluticasone (FLONASE) 50 MCG/ACT nasal spray Place 2 sprays into both nostrils daily.  . fluticasone (FLOVENT HFA) 110 MCG/ACT inhaler Inhale 2 puffs into the lungs 2 (two) times daily.  . montelukast (SINGULAIR) 10 MG tablet Take 10 mg by mouth at bedtime.  Marland Kitchen omeprazole (PRILOSEC) 40 MG capsule Take 40 mg by mouth daily.  . Probiotic Product (PROBIOTIC-10 PO) Take by mouth daily.  . sertraline (ZOLOFT) 100 MG tablet  Take 100 mg by mouth daily.  . simvastatin (ZOCOR) 20 MG tablet Take 20 mg by mouth daily.  Marland Kitchen tiotropium (SPIRIVA) 18 MCG inhalation capsule Place 18 mcg into inhaler and inhale daily.  . vitamin C (ASCORBIC ACID) 500 MG tablet Take 500 mg by mouth daily.  Marland Kitchen zolpidem (AMBIEN) 10 MG tablet Take 10 mg by mouth at bedtime as needed for sleep.   No current facility-administered medications on file prior to visit.    Imaging Review  Note: No new results found.        ROS  Cardiovascular History: Needs antibiotics prior to dental procedures Pulmonary or Respiratory History: Lung problems, Smoking and Coughing up mucus (Bronchitis) Neurological History: Curved spine Review of Past Neurological Studies: No results found for this or any previous visit. Psychological-Psychiatric History: Anxiousness and Difficulty sleeping and or falling asleep Gastrointestinal History: Alternating episodes iof diarrhea and constipation (IBS-Irritable bowe syndrome) Genitourinary History: No reported renal or genitourinary signs or symptoms such as difficulty voiding or producing urine, peeing blood, non-functioning kidney, kidney stones, difficulty emptying the bladder, difficulty controlling the flow of urine, or chronic kidney disease Hematological History: No reported hematological signs or symptoms such as prolonged bleeding, low or poor functioning platelets, bruising or bleeding easily, hereditary bleeding problems, low energy levels due to low hemoglobin or being anemic Endocrine  History: No reported endocrine signs or symptoms such as high or low blood sugar, rapid heart rate due to high thyroid levels, obesity or weight gain due to slow thyroid or thyroid disease Rheumatologic History: No reported rheumatological signs and symptoms such as fatigue, joint pain, tenderness, swelling, redness, heat, stiffness, decreased range of motion, with or without associated rash Musculoskeletal History: Negative for  myasthenia gravis, muscular dystrophy, multiple sclerosis or malignant hyperthermia Work History: Working full time  Allergies  Ms. Guadron is allergic to erythromycin and sulfa antibiotics.  Laboratory Chemistry  Inflammation Markers No results found for: CRP, ESRSEDRATE (CRP: Acute Phase) (ESR: Chronic Phase) Renal Function Markers Lab Results  Component Value Date   BUN 14 03/20/2017   CREATININE 0.66 03/20/2017   GFRAA >60 03/20/2017   GFRNONAA >60 03/20/2017   Hepatic Function Markers No results found for: AST, ALT, ALBUMIN, ALKPHOS, HCVAB Electrolytes Lab Results  Component Value Date   NA 140 03/20/2017   K 3.2 (L) 03/20/2017   CL 105 03/20/2017   CALCIUM 9.6 03/20/2017   Neuropathy Markers No results found for: [id number] Bone Pathology Markers Lab Results  Component Value Date   CALCIUM 9.6 03/20/2017   Coagulation Parameters Lab Results  Component Value Date   PLT 330 03/20/2017   Cardiovascular Markers Lab Results  Component Value Date   HGB 13.4 03/20/2017   HCT 39.9 03/20/2017   Note: Lab results reviewed.  PFSH  Drug: Ms. Mabey  has no drug history on file. Alcohol:  reports that she does not drink alcohol. Tobacco:  reports that she has been smoking.  She started smoking about 48 years ago. She has a 129.00 pack-year smoking history. she has never used smokeless tobacco. Medical:  has a past medical history of Arthritis, Asthma, COPD (chronic obstructive pulmonary disease) ([place]), GERD (gastroesophageal reflux disease), and Hypertension. Family: family history includes Breast cancer in her paternal aunt; Breast cancer (age of onset: 66) in her mother.  Past Surgical History:  Procedure Laterality Date  . ABDOMINAL HYSTERECTOMY    . carpel tunnel Bilateral   . FOOT SURGERY Left   . HERNIA REPAIR    . ROTATOR CUFF REPAIR Right    Active Ambulatory Problems    Diagnosis Date Noted  . Chronic bilateral low back pain without sciatica (Primary Area  of Pain)(B) (R>L) 01/06/2018  . Chronic ankle pain, bilateral (Secondary Area of Pain) (R>L) 01/06/2018  . Chronic pain of both knees [facility] Area of Pain) (L>R) 01/06/2018  . Chronic pain syndrome 01/06/2018  . Opiate use 01/06/2018  . Pharmacologic therapy 01/06/2018  . Disorder of skeletal system 01/06/2018  . Problems influencing health status 01/06/2018   Resolved Ambulatory Problems    Diagnosis Date Noted  . No Resolved Ambulatory Problems   Past Medical History:  Diagnosis Date  . Arthritis   . Asthma   . COPD (chronic obstructive pulmonary disease) ([place])   . GERD (gastroesophageal reflux disease)   . Hypertension    Constitutional Exam  General appearance: Well nourished, well developed, and well hydrated. In no apparent acute distress There were no vitals filed for this visit. BMI Assessment: Estimated body mass index is 37.13 kg/m as calculated from the following:   Height as of 12/28/17: _0  (1.575 m).   Weight as of 12/28/17: 203 lb (92.1 kg).  BMI interpretation table: BMI level Category Range association with higher incidence of chronic pain  <18 kg/m2 Underweight   18.5-24.9 kg/m2 Ideal body weight   25-29.9  kg/m2 Overweight Increased incidence by 20%  30-34.9 kg/m2 Obese (Class I) Increased incidence by 68%  35-39.9 kg/m2 Severe obesity (Class II) Increased incidence by 136%  >40 kg/m2 Extreme obesity (Class III) Increased incidence by 254%   BMI Readings from Last 4 Encounters:  12/28/17 37.13 kg/m  05/12/17 36.03 kg/m  03/20/17 35.67 kg/m   Wt Readings from Last 4 Encounters:  12/28/17 203 lb (92.1 kg)  05/12/17 197 lb (89.4 kg)  03/20/17 195 lb (88.5 kg)  Psych/Mental status: Alert, oriented x 3 (person, place, & time)       Eyes: PERLA Respiratory: No evidence of acute respiratory distress  Cervical Spine Exam  Inspection: No masses, redness, or swelling Alignment: Symmetrical Functional ROM: Unrestricted ROM      Stability: No  instability detected Muscle strength & Tone: Functionally intact Sensory: Unimpaired Palpation: No palpable anomalies              Upper Extremity (UE) Exam    Side: Right upper extremity  Side: Left upper extremity  Inspection: No masses, redness, swelling, or asymmetry. No contractures  Inspection: No masses, redness, swelling, or asymmetry. No contractures  Functional ROM: Unrestricted ROM          Functional ROM: Unrestricted ROM          Muscle strength & Tone: Functionally intact  Muscle strength & Tone: Functionally intact  Sensory: Unimpaired  Sensory: Unimpaired  Palpation: No palpable anomalies              Palpation: No palpable anomalies              Specialized Test(s): Deferred         Specialized Test(s): Deferred          Thoracic Spine Exam  Inspection: No masses, redness, or swelling Alignment: Symmetrical Functional ROM: Unrestricted ROM Stability: No instability detected Sensory: Unimpaired Muscle strength & Tone: No palpable anomalies  Lumbar Spine Exam  Inspection: No masses, redness, or swelling Alignment: Symmetrical Functional ROM: Adequate ROM      Stability: No instability detected Muscle strength & Tone: Functionally intact Sensory: Unimpaired Palpation: Complains of area being tender to palpation       Provocative Tests: Lumbar Hyperextension and rotation test: Negative       Patrick's Maneuver: Negative                    Gait & Posture Assessment  Ambulation: Patient ambulates using a walker Gait: Relatively normal for age and body habitus Posture: WNL   Lower Extremity Exam    Side: Right lower extremity  Side: Left lower extremity  Inspection: No masses, redness, swelling, or asymmetry. No contractures  Inspection: Edema  Functional ROM: Unrestricted ROM          Functional ROM: Guarding          Muscle strength & Tone: Functionally intact  Muscle strength & Tone: Functionally intact  Sensory: Unimpaired  Sensory: Unimpaired  Palpation: No  palpable anomalies  Palpation: No palpable anomalies   Assessment  Primary Diagnosis & Pertinent Problem List: The primary encounter diagnosis was Chronic bilateral low back pain without sciatica. Diagnoses of Chronic ankle pain, bilateral, Chronic pain of both knees, Chronic pain syndrome, Opiate use, Pharmacologic therapy, Disorder of skeletal system, and Problems influencing health status were also pertinent to this visit.  Visit Diagnosis: 1. Chronic bilateral low back pain without sciatica   2. Chronic ankle pain, bilateral   3. Chronic pain  of both knees   4. Chronic pain syndrome   5. Opiate use   6. Pharmacologic therapy   7. Disorder of skeletal system   8. Problems influencing health status    Plan of Care  Initial treatment plan:  Please be advised that as per protocol, today's visit has been an evaluation only. We have not taken over the patient's controlled substance management.  Problem-specific plan: No problem-specific Assessment & Plan notes found for this encounter.  Ordered Lab-work, Procedure(s), Referral(s), & Consult(s): Orders Placed This Encounter  Procedures  . DG Lumbar Spine Complete W/Bend  . DG Ankle Complete Right  . DG Ankle Complete Left  . Compliance Drug Analysis, Ur  . Comp. Metabolic Panel (12)  . Magnesium  . Vitamin B12  . Sedimentation rate  . 25-Hydroxyvitamin D Lcms D2+D3  . C-reactive protein  . Ambulatory referral to Psychology   Pharmacotherapy: Medications ordered:  No orders of the defined types were placed in this encounter.  Medications administered during this visit: Minahil L. Mainor had no medications administered during this visit.   Pharmacotherapy under consideration:  Opioid Analgesics: The patient was informed that there is no guarantee that she would be a candidate for opioid analgesics. The decision will be made following CDC guidelines. This decision will be based on the results of diagnostic studies, as well as  Ms. Bierly's risk profile.  Membrane stabilizer: To be determined at a later time Muscle relaxant: To be determined at a later time NSAID: To be determined at a later time Other analgesic(s): To be determined at a later time   Interventional therapies under consideration: Ms. Delawder was informed that there is no guarantee that she would be a candidate for interventional therapies. The decision will be based on the results of diagnostic studies, as well as Ms. Wray's risk profile.  Possible procedure(s): Diagnostic bilateral lumbar facet nerve block possible bilateral lumbar facet RFA Diagnostic bilateral intra-articular knee injection Diagnostic bilateral Hyalgan series Diagnostic bilateral genicular Nerve block Possible bilateral genicular RFA Diagnostic bilateral ankle injections   Provider-requested follow-up: Return for 2nd Visit, w/ Dr. Dossie Arbour, after [facility] eval.  Future Appointments  Date Time Provider [place]  01/25/2018 11:00 AM [facility] DEXA 1 [facility]    Primary Care Physician: [facility] [place] Location: [facility] Outpatient Pain Management Facility Note by:  Date: 01/06/2018; Time: 12:15 PM  Pain Score Disclaimer: We use the NRS-11 scale. This is a self-reported, subjective measurement of pain severity with only modest accuracy. It is used primarily to identify changes within a particular patient. It must be understood that outpatient pain scales are significantly less accurate that those used for research, where they can be applied under ideal controlled circumstances with minimal exposure to variables. In reality, the score is likely to be a combination of pain intensity and pain affect, where pain affect describes the degree of emotional arousal or changes in action readiness caused by the sensory experience of pain. Factors such as social and work situation, setting, emotional state, anxiety levels, expectation, and prior pain experience may  influence pain perception and show large inter-individual differences that may also be affected by time variables.  Patient instructions provided during this appointment: Patient Instructions    ____________________________________________________________________________________________  Appointment Policy Summary  It is our goal and responsibility to provide the medical community with assistance in the evaluation and management of patients with chronic pain. Unfortunately our resources are limited. Because we do not have an unlimited amount of  time, or available appointments, we are required to closely monitor and manage their use. The following rules exist to maximize their use:  Patient's responsibilities: 1. Punctuality:  At what time should I arrive? You should be physically present in our office 30 minutes before your scheduled appointment. Your scheduled appointment is with your assigned healthcare provider. However, it takes 5-10 minutes to be "checked-in", and another 15 minutes for the nurses to do the admission. If you arrive to our office at the time you were given for your appointment, you will end up being at least 20-25 minutes late to your appointment with the provider. 2. Tardiness:  What happens if I arrive only a few minutes after my scheduled appointment time? You will need to reschedule your appointment. The cutoff is your appointment time. This is why it is so important that you arrive at least 30 minutes before that appointment. If you have an appointment scheduled for 10:00 AM and you arrive at 10:01, you will be required to reschedule your appointment.  3. Plan ahead:  Always assume that you will encounter traffic on your way in. Plan for it. If you are dependent on a driver, make sure they understand these rules and the need to arrive early. 4. Other appointments and responsibilities:  Avoid scheduling any other appointments before or after your pain clinic appointments.   5. Be prepared:  Write down everything that you need to discuss with your healthcare provider and give this information to the admitting nurse. Write down the medications that you will need refilled. Bring your pills and bottles (even the empty ones), to all of your appointments, except for those where a procedure is scheduled. 6. No children or pets:  Find someone to take care of them. It is not appropriate to bring them in. 7. Scheduling changes:  We request "advanced notification" of any changes or cancellations. 8. Advanced notification:  Defined as a time period of more than 24 hours prior to the originally scheduled appointment. This allows for the appointment to be offered to other patients. 9. Rescheduling:  When a visit is rescheduled, it will require the cancellation of the original appointment. For this reason they both fall within the category of "Cancellations".  10. Cancellations:  They require advanced notification. Any cancellation less than 24 hours before the  appointment will be recorded as a "No Show". 11. No Show:  Defined as an unkept appointment where the patient failed to notify or declare to the practice their intention or inability to keep the appointment.  Corrective process for repeat offenders:  1. Tardiness: Three (3) episodes of rescheduling due to late arrivals will be recorded as one (1) "No Show". 2. Cancellation or reschedule: Three (3) cancellations or rescheduling will be recorded as one (1) "No Show". 3. "No Shows": Three (3) "No Shows" within a 12 month period will result in discharge from the practice.  ____________________________________________________________________________________________   ____________________________________________________________________________________________  Pain Scale  Introduction: The pain score used by this practice is the Verbal Numerical Rating Scale (VNRS-11). This is an 11-point scale. It is for adults and  children 10 years or older. There are significant differences in how the pain score is reported, used, and applied. Forget everything you learned in the past and learn this scoring system.  General Information: The scale should reflect your current level of pain. Unless you are specifically asked for the level of your worst pain, or your average pain. If you are asked for one of these two,  then it should be understood that it is over the past 24 hours.  Basic Activities of Daily Living (ADL): Personal hygiene, dressing, eating, transferring, and using restroom.  Instructions: Most patients tend to report their level of pain as a combination of two factors, their physical pain and their psychosocial pain. This last one is also known as "suffering" and it is reflection of how physical pain affects you socially and psychologically. From now on, report them separately. From this point on, when asked to report your pain level, report only your physical pain. Use the following table for reference.  Pain Clinic Pain Levels (0-5/10)  Pain Level Score  Description  No Pain 0   Mild pain 1 Nagging, annoying, but does not interfere with basic activities of daily living (ADL). Patients are able to eat, bathe, get dressed, toileting (being able to get on and off the toilet and perform personal hygiene functions), transfer (move in and out of bed or a chair without assistance), and maintain continence (able to control bladder and bowel functions). Blood pressure and heart rate are unaffected. A normal heart rate for a healthy adult ranges from 60 to 100 bpm (beats per minute).   Mild to moderate pain 2 Noticeable and distracting. Impossible to hide from other people. More frequent flare-ups. Still possible to adapt and function close to normal. It can be very annoying and may have occasional stronger flare-ups. With discipline, patients may get used to it and adapt.   Moderate pain 3 Interferes significantly with  activities of daily living (ADL). It becomes difficult to feed, bathe, get dressed, get on and off the toilet or to perform personal hygiene functions. Difficult to get in and out of bed or a chair without assistance. Very distracting. With effort, it can be ignored when deeply involved in activities.   Moderately severe pain 4 Impossible to ignore for more than a few minutes. With effort, patients may still be able to manage work or participate in some social activities. Very difficult to concentrate. Signs of autonomic nervous system discharge are evident: dilated pupils (mydriasis); mild sweating (diaphoresis); sleep interference. Heart rate becomes elevated (>115 bpm). Diastolic blood pressure (lower number) rises above 100 mmHg. Patients find relief in laying down and not moving.   Severe pain 5 Intense and extremely unpleasant. Associated with frowning face and frequent crying. Pain overwhelms the senses.  Ability to do any activity or maintain social relationships becomes significantly limited. Conversation becomes difficult. Pacing back and forth is common, as getting into a comfortable position is nearly impossible. Pain wakes you up from deep sleep. Physical signs will be obvious: pupillary dilation; increased sweating; goosebumps; brisk reflexes; cold, clammy hands and feet; nausea, vomiting or dry heaves; loss of appetite; significant sleep disturbance with inability to fall asleep or to remain asleep. When persistent, significant weight loss is observed due to the complete loss of appetite and sleep deprivation.  Blood pressure and heart rate becomes significantly elevated. Caution: If elevated blood pressure triggers a pounding headache associated with blurred vision, then the patient should immediately seek attention at an urgent or emergency care unit, as these may be signs of an impending stroke.    Emergency Department Pain Levels (6-10/10)  Emergency Room Pain 6 Severely limiting.  Requires emergency care and should not be seen or managed at an outpatient pain management facility. Communication becomes difficult and requires great effort. Assistance to reach the emergency department may be required. Facial flushing and profuse sweating along with  potentially dangerous increases in heart rate and blood pressure will be evident.   Distressing pain 7 Self-care is very difficult. Assistance is required to transport, or use restroom. Assistance to reach the emergency department will be required. Tasks requiring coordination, such as bathing and getting dressed become very difficult.   Disabling pain 8 Self-care is no longer possible. At this level, pain is disabling. The individual is unable to do even the most "basic" activities such as walking, eating, bathing, dressing, transferring to a bed, or toileting. Fine motor skills are lost. It is difficult to think clearly.   Incapacitating pain 9 Pain becomes incapacitating. Thought processing is no longer possible. Difficult to remember your own name. Control of movement and coordination are lost.   The worst pain imaginable 10 At this level, most patients pass out from pain. When this level is reached, collapse of the autonomic nervous system occurs, leading to a sudden drop in blood pressure and heart rate. This in turn results in a temporary and dramatic drop in blood flow to the brain, leading to a loss of consciousness. Fainting is one of the body's self defense mechanisms. Passing out puts the brain in a calmed state and causes it to shut down for a while, in order to begin the healing process.    Summary: 1. Refer to this scale when providing [place] with your pain level. 2. Be accurate and careful when reporting your pain level. This will help with your care. 3. Over-reporting your pain level will lead to loss of credibility. 4. Even a level of 1/10 means that there is pain and will be treated at our facility. 5. High, inaccurate  reporting will be documented as "Symptom Exaggeration", leading to loss of credibility and suspicions of possible secondary gains such as obtaining more narcotics, or wanting to appear disabled, for fraudulent reasons. 6. Only pain levels of 5 or below will be seen at our facility. 7. Pain levels of 6 and above will be sent to the Emergency Department and the appointment cancelled. ____________________________________________________________________________________________   BMI Assessment: Estimated body mass index is 37.13 kg/m as calculated from the following:   Height as of 12/28/17: _0  (1.575 m).   Weight as of 12/28/17: 203 lb (92.1 kg).  BMI interpretation table: BMI level Category Range association with higher incidence of chronic pain  <18 kg/m2 Underweight   18.5-24.9 kg/m2 Ideal body weight   25-29.9 kg/m2 Overweight Increased incidence by 20%  30-34.9 kg/m2 Obese (Class I) Increased incidence by 68%  35-39.9 kg/m2 Severe obesity (Class II) Increased incidence by 136%  >40 kg/m2 Extreme obesity (Class III) Increased incidence by 254%   BMI Readings from Last 4 Encounters:  12/28/17 37.13 kg/m  05/12/17 36.03 kg/m  03/20/17 35.67 kg/m   Wt Readings from Last 4 Encounters:  12/28/17 203 lb (92.1 kg)  05/12/17 197 lb (89.4 kg)  03/20/17 195 lb (88.5 kg)

## 2018-01-06 NOTE — Patient Instructions (Signed)
____________________________________________________________________________________________  Appointment Policy Summary  It is our goal and responsibility to provide the medical community with assistance in the evaluation and management of patients with chronic pain. Unfortunately our resources are limited. Because we do not have an unlimited amount of time, or available appointments, we are required to closely monitor and manage their use. The following rules exist to maximize their use:  Patient's responsibilities: 1. Punctuality:  At what time should I arrive? You should be physically present in our office 30 minutes before your scheduled appointment. Your scheduled appointment is with your assigned healthcare provider. However, it takes 5-10 minutes to be "checked-in", and another 15 minutes for the nurses to do the admission. If you arrive to our office at the time you were given for your appointment, you will end up being at least 20-25 minutes late to your appointment with the provider. 2. Tardiness:  What happens if I arrive only a few minutes after my scheduled appointment time? You will need to reschedule your appointment. The cutoff is your appointment time. This is why it is so important that you arrive at least 30 minutes before that appointment. If you have an appointment scheduled for 10:00 AM and you arrive at 10:01, you will be required to reschedule your appointment.  3. Plan ahead:  Always assume that you will encounter traffic on your way in. Plan for it. If you are dependent on a driver, make sure they understand these rules and the need to arrive early. 4. Other appointments and responsibilities:  Avoid scheduling any other appointments before or after your pain clinic appointments.  5. Be prepared:  Write down everything that you need to discuss with your healthcare provider and give this information to the admitting nurse. Write down the medications that you will need  refilled. Bring your pills and bottles (even the empty ones), to all of your appointments, except for those where a procedure is scheduled. 6. No children or pets:  Find someone to take care of them. It is not appropriate to bring them in. 7. Scheduling changes:  We request "advanced notification" of any changes or cancellations. 8. Advanced notification:  Defined as a time period of more than 24 hours prior to the originally scheduled appointment. This allows for the appointment to be offered to other patients. 9. Rescheduling:  When a visit is rescheduled, it will require the cancellation of the original appointment. For this reason they both fall within the category of "Cancellations".  10. Cancellations:  They require advanced notification. Any cancellation less than 24 hours before the  appointment will be recorded as a "No Show". 11. No Show:  Defined as an unkept appointment where the patient failed to notify or declare to the practice their intention or inability to keep the appointment.  Corrective process for repeat offenders:  1. Tardiness: Three (3) episodes of rescheduling due to late arrivals will be recorded as one (1) "No Show". 2. Cancellation or reschedule: Three (3) cancellations or rescheduling will be recorded as one (1) "No Show". 3. "No Shows": Three (3) "No Shows" within a 12 month period will result in discharge from the practice.  ____________________________________________________________________________________________   ____________________________________________________________________________________________  Pain Scale  Introduction: The pain score used by this practice is the Verbal Numerical Rating Scale (VNRS-11). This is an 11-point scale. It is for adults and children 10 years or older. There are significant differences in how the pain score is reported, used, and applied. Forget everything you learned in the past  and learn this scoring  system.  General Information: The scale should reflect your current level of pain. Unless you are specifically asked for the level of your worst pain, or your average pain. If you are asked for one of these two, then it should be understood that it is over the past 24 hours.  Basic Activities of Daily Living (ADL): Personal hygiene, dressing, eating, transferring, and using restroom.  Instructions: Most patients tend to report their level of pain as a combination of two factors, their physical pain and their psychosocial pain. This last one is also known as "suffering" and it is reflection of how physical pain affects you socially and psychologically. From now on, report them separately. From this point on, when asked to report your pain level, report only your physical pain. Use the following table for reference.  Pain Clinic Pain Levels (0-5/10)  Pain Level Score  Description  No Pain 0   Mild pain 1 Nagging, annoying, but does not interfere with basic activities of daily living (ADL). Patients are able to eat, bathe, get dressed, toileting (being able to get on and off the toilet and perform personal hygiene functions), transfer (move in and out of bed or a chair without assistance), and maintain continence (able to control bladder and bowel functions). Blood pressure and heart rate are unaffected. A normal heart rate for a healthy adult ranges from 60 to 100 bpm (beats per minute).   Mild to moderate pain 2 Noticeable and distracting. Impossible to hide from other people. More frequent flare-ups. Still possible to adapt and function close to normal. It can be very annoying and may have occasional stronger flare-ups. With discipline, patients may get used to it and adapt.   Moderate pain 3 Interferes significantly with activities of daily living (ADL). It becomes difficult to feed, bathe, get dressed, get on and off the toilet or to perform personal hygiene functions. Difficult to get in and out of  bed or a chair without assistance. Very distracting. With effort, it can be ignored when deeply involved in activities.   Moderately severe pain 4 Impossible to ignore for more than a few minutes. With effort, patients may still be able to manage work or participate in some social activities. Very difficult to concentrate. Signs of autonomic nervous system discharge are evident: dilated pupils (mydriasis); mild sweating (diaphoresis); sleep interference. Heart rate becomes elevated (>115 bpm). Diastolic blood pressure (lower number) rises above 100 mmHg. Patients find relief in laying down and not moving.   Severe pain 5 Intense and extremely unpleasant. Associated with frowning face and frequent crying. Pain overwhelms the senses.  Ability to do any activity or maintain social relationships becomes significantly limited. Conversation becomes difficult. Pacing back and forth is common, as getting into a comfortable position is nearly impossible. Pain wakes you up from deep sleep. Physical signs will be obvious: pupillary dilation; increased sweating; goosebumps; brisk reflexes; cold, clammy hands and feet; nausea, vomiting or dry heaves; loss of appetite; significant sleep disturbance with inability to fall asleep or to remain asleep. When persistent, significant weight loss is observed due to the complete loss of appetite and sleep deprivation.  Blood pressure and heart rate becomes significantly elevated. Caution: If elevated blood pressure triggers a pounding headache associated with blurred vision, then the patient should immediately seek attention at an urgent or emergency care unit, as these may be signs of an impending stroke.    Emergency Department Pain Levels (6-10/10)  Emergency Room Pain  6 Severely limiting. Requires emergency care and should not be seen or managed at an outpatient pain management facility. Communication becomes difficult and requires great effort. Assistance to reach the  emergency department may be required. Facial flushing and profuse sweating along with potentially dangerous increases in heart rate and blood pressure will be evident.   Distressing pain 7 Self-care is very difficult. Assistance is required to transport, or use restroom. Assistance to reach the emergency department will be required. Tasks requiring coordination, such as bathing and getting dressed become very difficult.   Disabling pain 8 Self-care is no longer possible. At this level, pain is disabling. The individual is unable to do even the most "basic" activities such as walking, eating, bathing, dressing, transferring to a bed, or toileting. Fine motor skills are lost. It is difficult to think clearly.   Incapacitating pain 9 Pain becomes incapacitating. Thought processing is no longer possible. Difficult to remember your own name. Control of movement and coordination are lost.   The worst pain imaginable 10 At this level, most patients pass out from pain. When this level is reached, collapse of the autonomic nervous system occurs, leading to a sudden drop in blood pressure and heart rate. This in turn results in a temporary and dramatic drop in blood flow to the brain, leading to a loss of consciousness. Fainting is one of the body's self defense mechanisms. Passing out puts the brain in a calmed state and causes it to shut down for a while, in order to begin the healing process.    Summary: 1. Refer to this scale when providing us with your pain level. 2. Be accurate and careful when reporting your pain level. This will help with your care. 3. Over-reporting your pain level will lead to loss of credibility. 4. Even a level of 1/10 means that there is pain and will be treated at our facility. 5. High, inaccurate reporting will be documented as "Symptom Exaggeration", leading to loss of credibility and suspicions of possible secondary gains such as obtaining more narcotics, or wanting to appear  disabled, for fraudulent reasons. 6. Only pain levels of 5 or below will be seen at our facility. 7. Pain levels of 6 and above will be sent to the Emergency Department and the appointment cancelled. ____________________________________________________________________________________________   BMI Assessment: Estimated body mass index is 37.13 kg/m as calculated from the following:   Height as of 12/28/17: 5\' 2"  (1.575 m).   Weight as of 12/28/17: 203 lb (92.1 kg).  BMI interpretation table: BMI level Category Range association with higher incidence of chronic pain  <18 kg/m2 Underweight   18.5-24.9 kg/m2 Ideal body weight   25-29.9 kg/m2 Overweight Increased incidence by 20%  30-34.9 kg/m2 Obese (Class I) Increased incidence by 68%  35-39.9 kg/m2 Severe obesity (Class II) Increased incidence by 136%  >40 kg/m2 Extreme obesity (Class III) Increased incidence by 254%   BMI Readings from Last 4 Encounters:  12/28/17 37.13 kg/m  05/12/17 36.03 kg/m  03/20/17 35.67 kg/m   Wt Readings from Last 4 Encounters:  12/28/17 203 lb (92.1 kg)  05/12/17 197 lb (89.4 kg)  03/20/17 195 lb (88.5 kg)

## 2018-01-06 NOTE — Progress Notes (Signed)
Safety precautions to be maintained throughout the outpatient stay will include: orient to surroundings, keep bed in low position, maintain call bell within reach at all times, provide assistance with transfer out of bed and ambulation.  

## 2018-01-10 ENCOUNTER — Encounter: Payer: Self-pay | Admitting: Nurse Practitioner

## 2018-01-10 DIAGNOSIS — R7982 Elevated C-reactive protein (CRP): Secondary | ICD-10-CM | POA: Insufficient documentation

## 2018-01-10 LAB — COMP. METABOLIC PANEL (12)
A/G RATIO: 2 (ref 1.2–2.2)
ALBUMIN: 4.2 g/dL (ref 3.5–5.5)
ALK PHOS: 132 IU/L — AB (ref 39–117)
AST: 17 IU/L (ref 0–40)
BUN/Creatinine Ratio: 19 (ref 9–23)
BUN: 13 mg/dL (ref 6–24)
Bilirubin Total: <0.2 mg/dL (ref 0.0–1.2)
CHLORIDE: 107 mmol/L — AB (ref 96–106)
Calcium: 9.5 mg/dL (ref 8.7–10.2)
Creatinine, Ser: 0.67 mg/dL (ref 0.57–1.00)
GFR calc Af Amer: 111 mL/min/{1.73_m2} (ref >59)
GFR, EST NON AFRICAN AMERICAN: 97 mL/min/{1.73_m2} (ref >59)
GLOBULIN, TOTAL: 2.1 g/dL (ref 1.5–4.5)
Glucose: 91 mg/dL (ref 65–99)
POTASSIUM: 4.2 mmol/L (ref 3.5–5.2)
SODIUM: 146 mmol/L — AB (ref 134–144)
Total Protein: 6.3 g/dL (ref 6.0–8.5)

## 2018-01-10 LAB — SEDIMENTATION RATE: SED RATE: 8 mm/h (ref 0–40)

## 2018-01-10 LAB — 25-HYDROXYVITAMIN D LCMS D2+D3

## 2018-01-10 LAB — VITAMIN B12: VITAMIN B 12: 583 pg/mL (ref 232–1245)

## 2018-01-10 LAB — C-REACTIVE PROTEIN: CRP: 7.7 mg/L — AB (ref 0.0–4.9)

## 2018-01-10 LAB — MAGNESIUM: Magnesium: 1.9 mg/dL (ref 1.6–2.3)

## 2018-01-10 LAB — 25-HYDROXY VITAMIN D LCMS D2+D3
25-Hydroxy, Vitamin D-3: 51 ng/mL
25-Hydroxy, Vitamin D: 51 ng/mL

## 2018-01-10 NOTE — Progress Notes (Signed)
Results were reviewed and found to be: mildly abnormal  No acute injury or pathology identified  Review would suggest interventional pain management techniques may be of benefit 

## 2018-01-12 LAB — COMPLIANCE DRUG ANALYSIS, UR

## 2018-01-25 ENCOUNTER — Ambulatory Visit
Admission: RE | Admit: 2018-01-25 | Discharge: 2018-01-25 | Disposition: A | Discharge status: Home / Self Care | Payer: 59 | Source: Ambulatory Visit | Attending: Family Medicine | Admitting: Family Medicine

## 2018-01-25 DIAGNOSIS — Z780 Asymptomatic menopausal state: Secondary | ICD-10-CM | POA: Diagnosis present

## 2018-01-25 DIAGNOSIS — M810 Age-related osteoporosis without current pathological fracture: Secondary | ICD-10-CM | POA: Diagnosis not present

## 2018-02-16 ENCOUNTER — Other Ambulatory Visit: Payer: Self-pay

## 2018-02-16 ENCOUNTER — Ambulatory Visit: Payer: 59 | Attending: Pain Medicine | Admitting: Pain Medicine

## 2018-02-16 ENCOUNTER — Encounter: Payer: Self-pay | Admitting: Pain Medicine

## 2018-02-16 VITALS — BP 148/96 | HR 79 | Temp 98.1°F | Ht 62.0 in | Wt 201.0 lb

## 2018-02-16 DIAGNOSIS — Z882 Allergy status to sulfonamides status: Secondary | ICD-10-CM | POA: Diagnosis not present

## 2018-02-16 DIAGNOSIS — K5903 Drug induced constipation: Secondary | ICD-10-CM

## 2018-02-16 DIAGNOSIS — M899 Disorder of bone, unspecified: Secondary | ICD-10-CM | POA: Diagnosis not present

## 2018-02-16 DIAGNOSIS — R7982 Elevated C-reactive protein (CRP): Secondary | ICD-10-CM

## 2018-02-16 DIAGNOSIS — Z79899 Other long term (current) drug therapy: Secondary | ICD-10-CM | POA: Diagnosis not present

## 2018-02-16 DIAGNOSIS — Z9889 Other specified postprocedural states: Secondary | ICD-10-CM | POA: Diagnosis not present

## 2018-02-16 DIAGNOSIS — K219 Gastro-esophageal reflux disease without esophagitis: Secondary | ICD-10-CM | POA: Insufficient documentation

## 2018-02-16 DIAGNOSIS — M25561 Pain in right knee: Secondary | ICD-10-CM | POA: Diagnosis not present

## 2018-02-16 DIAGNOSIS — M47818 Spondylosis without myelopathy or radiculopathy, sacral and sacrococcygeal region: Secondary | ICD-10-CM

## 2018-02-16 DIAGNOSIS — M25572 Pain in left ankle and joints of left foot: Secondary | ICD-10-CM

## 2018-02-16 DIAGNOSIS — M533 Sacrococcygeal disorders, not elsewhere classified: Secondary | ICD-10-CM

## 2018-02-16 DIAGNOSIS — Z881 Allergy status to other antibiotic agents status: Secondary | ICD-10-CM | POA: Insufficient documentation

## 2018-02-16 DIAGNOSIS — M47817 Spondylosis without myelopathy or radiculopathy, lumbosacral region: Secondary | ICD-10-CM

## 2018-02-16 DIAGNOSIS — M545 Low back pain, unspecified: Secondary | ICD-10-CM

## 2018-02-16 DIAGNOSIS — M419 Scoliosis, unspecified: Secondary | ICD-10-CM | POA: Diagnosis not present

## 2018-02-16 DIAGNOSIS — M19071 Primary osteoarthritis, right ankle and foot: Secondary | ICD-10-CM | POA: Insufficient documentation

## 2018-02-16 DIAGNOSIS — Z90710 Acquired absence of both cervix and uterus: Secondary | ICD-10-CM | POA: Insufficient documentation

## 2018-02-16 DIAGNOSIS — M47819 Spondylosis without myelopathy or radiculopathy, site unspecified: Secondary | ICD-10-CM | POA: Insufficient documentation

## 2018-02-16 DIAGNOSIS — F17210 Nicotine dependence, cigarettes, uncomplicated: Secondary | ICD-10-CM | POA: Insufficient documentation

## 2018-02-16 DIAGNOSIS — M19072 Primary osteoarthritis, left ankle and foot: Secondary | ICD-10-CM

## 2018-02-16 DIAGNOSIS — T402X5A Adverse effect of other opioids, initial encounter: Secondary | ICD-10-CM

## 2018-02-16 DIAGNOSIS — M47816 Spondylosis without myelopathy or radiculopathy, lumbar region: Secondary | ICD-10-CM

## 2018-02-16 DIAGNOSIS — G8929 Other chronic pain: Secondary | ICD-10-CM

## 2018-02-16 DIAGNOSIS — Z789 Other specified health status: Secondary | ICD-10-CM

## 2018-02-16 DIAGNOSIS — M5136 Other intervertebral disc degeneration, lumbar region: Secondary | ICD-10-CM | POA: Insufficient documentation

## 2018-02-16 DIAGNOSIS — M25571 Pain in right ankle and joints of right foot: Secondary | ICD-10-CM

## 2018-02-16 DIAGNOSIS — G894 Chronic pain syndrome: Secondary | ICD-10-CM

## 2018-02-16 DIAGNOSIS — M51369 Other intervertebral disc degeneration, lumbar region without mention of lumbar back pain or lower extremity pain: Secondary | ICD-10-CM | POA: Insufficient documentation

## 2018-02-16 DIAGNOSIS — F1190 Opioid use, unspecified, uncomplicated: Secondary | ICD-10-CM

## 2018-02-16 DIAGNOSIS — R252 Cramp and spasm: Secondary | ICD-10-CM

## 2018-02-16 DIAGNOSIS — M4180 Other forms of scoliosis, site unspecified: Secondary | ICD-10-CM | POA: Diagnosis not present

## 2018-02-16 DIAGNOSIS — M5388 Other specified dorsopathies, sacral and sacrococcygeal region: Secondary | ICD-10-CM

## 2018-02-16 DIAGNOSIS — M25562 Pain in left knee: Secondary | ICD-10-CM

## 2018-02-16 MED ORDER — HYDROCODONE-ACETAMINOPHEN 5-325 MG PO TABS: 1.0000 | ORAL_TABLET | Freq: Every day | ORAL | 0 refills | Status: DC | PRN

## 2018-02-16 MED ORDER — MAGNESIUM OXIDE -MG SUPPLEMENT 500 MG PO CAPS: 1.0000 | ORAL_CAPSULE | Freq: Two times a day (BID) | ORAL | 2 refills | Status: DC

## 2018-02-16 NOTE — Progress Notes (Signed)
Patient's Name: Sandra Strong  MRN: [id number]  Referring Provider: [facility], [facility]  DOB: [birth year]  PCP: [facility] [place]  DOS: 02/16/2018  Note by: Gaspar Cola, MD  Service setting: Ambulatory outpatient  Specialty: Interventional Pain Management  Location: [facility] (AMB) Pain Management Facility    Patient type: Established   Primary Reason(s) for Visit: Encounter for evaluation before starting new chronic pain management plan of care (Level of risk: moderate) CC: Tailbone Pain (lower)  HPI  Ms. Sandra Strong is a 62 y.o. year old, female patient, who comes today for a follow-up evaluation to review the test results and decide on a treatment plan. She has Chronic low back pain (Primary Area of Pain) (Bilateral) (R>L); Chronic ankle pain (Secondary Area of Pain) (Bilateral) (R>L); Chronic knee pain (Tertiary Area of Pain) (Bilateral) (L>R); Chronic pain syndrome; Opiate use; Pharmacologic therapy; Disorder of skeletal system; Problems influencing health status; Elevated C-reactive protein (CRP); Chronic sacroiliac joint pain (Bilateral) (R>L); Spondylosis without myelopathy or radiculopathy, lumbosacral region; Lumbar facet syndrome (Bilateral) (R>L); Other specified dorsopathies, sacral and sacrococcygeal region; Levoscoliosis; Lumbar facet arthropathy; 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 Her primarily concern today is the Tailbone Pain (lower)  Pain Assessment: Location: Lower Other (Comment)(coccyx) Radiating: Denies Onset: More than a month ago Duration: Chronic pain Quality: Squeezing, Heaviness Severity:  8 /10 (self-reported pain score)  Note: Reported level is inconsistent with clinical observations. Clinically the patient looks like a 2/10 A 2/10 is viewed as "Mild to Moderate" and described as noticeable and distracting. Impossible to hide from other people. More frequent flare-ups. Still possible to adapt and function close to normal. It can be very annoying and may have occasional stronger flare-ups. With discipline, patients may get used to it and adapt. Information on the proper use of the pain scale provided to the patient today. When using our objective Pain Scale, levels between 6 and 10/10 are said to belong in an emergency room, as it progressively worsens from a 6/10, described as severely limiting, requiring emergency care not usually available at an outpatient pain management facility. At a 6/10 level, communication becomes difficult and requires great effort. Assistance to reach the emergency department may be required. Facial flushing and profuse sweating along with potentially dangerous increases in heart rate and blood pressure will be evident. Timing: Intermittent  Ms. Sandra Strong comes in today for a follow-up visit after her initial evaluation on 01/06/2018. Today we went over the results of her tests. These were explained in "Layman's terms". During today's appointment we went over my diagnostic impression, as well as the proposed treatment plan.  According to the patient her primary area of pain is in her lower back. She admits that the right side greater than the left (R>L). She denies any previous injury.  She has been having pain for greater than 6 years. She denies any surgery. She has had bilateral SI joint injections Dr[facility] January 2019. Left helped and right tends to come back sooner. Her worse pain is in the tailbone. No bowel or bladder problems. She admits that this was not effective. She has started physical therapy in the past , this was not effective. She has  had recent images of SI joints.  Having constipation with meds.  She admits that her second area of pain is in her ankles (L>R). She denies any injury. She admits that she has OA in several joints.  She does feel like it is getting worse. She has swelling and some weakness. She denies any surgeries, interventional therapy , physical therapy or recent images.  Her third area of pain I in her knees. She admits that the left knee is worse than the right (L>R).  She denies any previous surgery, interventional therapy or physical therapy. She admits that she has had recent images at [facility]. Had injections in knees in [place], several years ago.  In considering the treatment plan options, Ms. Sandra Strong was reminded that I no longer take patients for medication management only. I asked her to let me know if she had no intention of taking advantage of the interventional therapies, so that we could make arrangements to provide this space to someone interested. I also made it clear that undergoing interventional therapies for the purpose of getting pain medications is very inappropriate on the part of a patient, and it will not be tolerated in this practice. This type of behavior would suggest true addiction and therefore it requires referral to an addiction specialist.   Further details on both, my assessment(s), as well as the proposed treatment plan, please see below.  Controlled Substance Pharmacotherapy Assessment REMS (Risk Evaluation and Mitigation Strategy)  Analgesic: (Tylenol No. 3) acetaminophen codeine No. 3 one tablet every 12 hours (fill date 02/03/2017) (left form dental surgery) Highest recorded MME/day: 45 mg/day MME/day: 10 mg/day Pill Count: None expected due to no prior prescriptions written by our practice. No notes on file Pharmacokinetics: Liberation and absorption (onset of action): WNL Distribution (time to peak effect): WNL Metabolism and excretion (duration of action): WNL          Pharmacodynamics: Desired effects: Analgesia: Ms. Sandra Strong reports >50% benefit. Functional ability: Patient reports that medication allows her to accomplish basic ADLs Clinically meaningful improvement in function (CMIF): Sustained CMIF goals met Perceived effectiveness: Described as relatively effective, allowing for increase in activities of daily living (ADL) Undesirable effects: Side-effects or Adverse reactions: None reported Monitoring: [place] PMP: Online review of the past 73-monthperiod previously conducted. Not applicable at this point since we have not taken over the patient's medication management yet. List of all UDS test(s) done:  Lab Results  Component Value Date   SUMMARY FINAL 01/06/2018   Last UDS on record: Summary  Date Value Ref Range Status  01/06/2018 FINAL  Final    Comment:    ==================================================================== TOXASSURE COMP DRUG ANALYSIS,UR ==================================================================== Test                             Result       Flag       Units Drug Present   Codeine                        88                      ng/mg creat   Norcodeine                     90                      ng/mg creat    Sources of codeine  include scheduled prescription medications.    Norcodeine is an expected metabolite of codeine.   Zolpidem Acid                  PRESENT    Zolpidem acid is an expected metabolite of zolpidem.   Sertraline                     PRESENT   Desmethylsertraline            PRESENT    Desmethylsertraline is an expected metabolite of sertraline.   Acetaminophen                  PRESENT   Diphenhydramine                PRESENT ==================================================================== Test                      Result    Flag   Units      Ref Range   Creatinine              68               mg/dL       >=20 ==================================================================== Declared Medications:  Medication list was not provided. ==================================================================== For clinical consultation, please call [phone]. ====================================================================    UDS interpretation: No unexpected findings.          Medication Assessment Form: Patient introduced to form today Treatment compliance: Treatment may start today if patient agrees with proposed plan. Evaluation of compliance is not applicable at this point Risk Assessment Profile: Aberrant behavior: See initial evaluations. None observed or detected today Comorbid factors increasing risk of overdose: See initial evaluation. No additional risks detected today Medical Psychology Evaluation: Please see scanned results in medical record. Opioid Risk Tool - 01/06/18 0929      Family History of Substance Abuse   Alcohol  Positive Female    Illegal Drugs  Positive Female    Rx Drugs  Negative      Personal History of Substance Abuse   Alcohol  Negative    Illegal Drugs  Negative    Rx Drugs  Negative      Age   Age between 4-45 years   No      History of Preadolescent Sexual Abuse   History of Preadolescent Sexual Abuse  Negative or Female      Psychological Disease   Psychological Disease  Negative    Depression  Negative      Total Score   Opioid Risk Tool Scoring  3    Opioid Risk Interpretation  Low Risk      ORT Scoring interpretation table:  Score <3 = Low Risk for SUD  Score between 4-7 = Moderate Risk for SUD  Score >8 = High Risk for Opioid Abuse   Risk Mitigation Strategies:  Patient opioid safety counseling: Completed today. Counseling provided to patient as per "Patient Counseling Document". Document signed by patient, attesting to counseling and understanding Patient-Prescriber Agreement (PPA): Obtained today.  Controlled substance  notification to other providers: Written and sent today.  Pharmacologic Plan: Today we may be taking over the patient's pharmacological regimen. See below.             Laboratory Chemistry  Inflammation Markers (CRP: Acute Phase) (ESR: Chronic Phase) Lab Results  Component Value Date   CRP 7.7 (H) 01/06/2018   ESRSEDRATE 8 01/06/2018  Interpretation: Elevated CRP  is usually seen with acute phase inflammatory disease.  Renal Function Markers Lab Results  Component Value Date   BUN 13 01/06/2018   CREATININE 0.67 01/06/2018   GFRAA 111 01/06/2018   GFRNONAA 97 01/06/2018                 Hepatic Function Markers Lab Results  Component Value Date   AST 17 01/06/2018   ALBUMIN 4.2 01/06/2018   ALKPHOS 132 (H) 01/06/2018                 Electrolytes Lab Results  Component Value Date   NA 146 (H) 01/06/2018   K 4.2 01/06/2018   CL 107 (H) 01/06/2018   CALCIUM 9.5 01/06/2018   MG 1.9 01/06/2018                        Neuropathy Markers Lab Results  Component Value Date   VITAMINB12 583 01/06/2018                 Bone Pathology Markers Lab Results  Component Value Date   25OHVITD1 51 01/06/2018   25OHVITD2 <1.0 01/06/2018   25OHVITD3 51 01/06/2018                         Coagulation Parameters Lab Results  Component Value Date   PLT 330 03/20/2017                 Cardiovascular Markers Lab Results  Component Value Date   CKTOTAL 105 09/22/2012   CKMB 1.3 09/22/2012   TROPONINI <0.03 03/20/2017   HGB 13.4 03/20/2017   HCT 39.9 03/20/2017                 Note: Lab results reviewed.  Recent Diagnostic Imaging Review  Lumbosacral Imaging: Lumbar DG Bending views:  Results for orders placed during the hospital encounter of 01/06/18  DG Lumbar Spine Complete W/Bend   Narrative CLINICAL DATA:  Chronic low back pain for several years, limited range of motion  EXAM: LUMBAR SPINE - COMPLETE WITH BENDING VIEWS  COMPARISON:  None.  FINDINGS: There is  mild curvature of the lumbar spine convex to the left by approximately 9 degrees. In the lateral view the lumbar vertebrae are in normal alignment. There is very mild degenerative disc disease at L2-3 and L3-4 levels with slight sclerosis and mild spurring. The remainder of intervertebral disc spaces appear normal. No compression deformity is seen. On oblique views only mild degenerative change of the facet joints is seen at L5-S1.  Through flexion and extension there is relatively normal range of motion with no malalignment  IMPRESSION: 1. Mild degenerative disc disease at L2-3 and L3-4. 2. Slight curvature of the lumbar spine convex to the left by 9 degrees. 3. Only mild degenerative change of the facet joints of L5-S1.   Electronically Signed   By: Ivar Drape M.D.   On: 01/06/2018 16:39    Ankle Imaging: Ankle-R DG Complete:  Results for orders placed during the hospital encounter of 01/06/18  DG Ankle Complete Right   Narrative CLINICAL DATA:  Ankle pain, no injury  EXAM: RIGHT ANKLE - COMPLETE 3+ VIEW  COMPARISON:  None.  FINDINGS: The right ankle joint appears normal. Alignment is normal. Only mild degenerative spurring is noted from the medial malleolus. No fracture is seen. There is a plantar calcaneal degenerative spur present. Some spurring is noted from the tarsal navicula  on the dorsal aspect as well.  IMPRESSION: No acute abnormality.  Mild degenerative change as noted above.   Electronically Signed   By: Ivar Drape M.D.   On: 01/06/2018 16:40    Ankle-L DG Complete:  Results for orders placed during the hospital encounter of 01/06/18  DG Ankle Complete Left   Narrative CLINICAL DATA:  Ankle pain, no acute injury  EXAM: LEFT ANKLE COMPLETE - 3+ VIEW  COMPARISON:  None.  FINDINGS: The left ankle joint appears normal. Alignment is normal. No fracture is seen. A plantar calcaneal degenerative spur is present.  IMPRESSION: No acute  abnormality.  Small plantar calcaneal degenerative spur.   Electronically Signed   By: Ivar Drape M.D.   On: 01/06/2018 16:40    Complexity Note: Imaging results reviewed. Results shared with Ms. Probert, using Layman's terms.                         Meds   Current Outpatient Medications:  .  acetaminophen (TYLENOL) 500 MG tablet, Take 500 mg by mouth every 6 (six) hours as needed. 2 tabs, Disp: , Rfl:  .  albuterol (PROVENTIL HFA;VENTOLIN HFA) 108 (90 Base) MCG/ACT inhaler, Inhale 2 puffs into the lungs every 4 (four) hours as needed for wheezing or shortness of breath., Disp: , Rfl:  .  albuterol (PROVENTIL) (2.5 MG/3ML) 0.083% nebulizer solution, Take 2.5 mg by nebulization every 6 (six) hours as needed for wheezing or shortness of breath., Disp: , Rfl:  .  cetirizine (ZYRTEC) 10 MG tablet, Take 10 mg by mouth daily., Disp: , Rfl:  .  Cholecalciferol (VITAMIN D3) 2000 units TABS, Take 2,000 Units by mouth daily., Disp: , Rfl:  .  diphenhydrAMINE (BENADRYL) 25 mg capsule, Take 50 mg by mouth at bedtime as needed., Disp: , Rfl:  .  etodolac (LODINE) 400 MG tablet, Take 400 mg by mouth 2 (two) times daily., Disp: , Rfl:  .  fluticasone (FLONASE) 50 MCG/ACT nasal spray, Place 2 sprays into both nostrils daily., Disp: , Rfl:  .  fluticasone (FLOVENT HFA) 110 MCG/ACT inhaler, Inhale 2 puffs into the lungs 2 (two) times daily., Disp: , Rfl:  .  montelukast (SINGULAIR) 10 MG tablet, Take 10 mg by mouth at bedtime., Disp: , Rfl:  .  Multiple Vitamins-Minerals (MULTIVITAMIN WITH MINERALS) tablet, Take 1 tablet by mouth daily., Disp: , Rfl:  .  omeprazole (PRILOSEC) 40 MG capsule, Take 40 mg by mouth daily., Disp: , Rfl:  .  Probiotic Product (PROBIOTIC-10 PO), Take by mouth daily., Disp: , Rfl:  .  sertraline (ZOLOFT) 100 MG tablet, Take 100 mg by mouth daily., Disp: , Rfl:  .  HYDROcodone-acetaminophen (NORCO/VICODIN) 5-325 MG tablet, Take 1 tablet by mouth daily as needed for severe pain., Disp:  30 tablet, Rfl: 0 .  Magnesium Oxide 500 MG CAPS, Take 1 capsule (500 mg total) by mouth 2 (two) times daily at 8 am and 10 pm., Disp: 60 capsule, Rfl: 2  ROS  Constitutional: Denies any fever or chills Gastrointestinal: No reported hemesis, hematochezia, vomiting, or acute GI distress Musculoskeletal: Denies any acute onset joint swelling, redness, loss of ROM, or weakness Neurological: No reported episodes of acute onset apraxia, aphasia, dysarthria, agnosia, amnesia, paralysis, loss of coordination, or loss of consciousness  Allergies  Ms. Marczak is allergic to erythromycin and sulfa antibiotics.  PFSH  Drug: Ms. West  has no drug history on file. Alcohol:  reports that she does  not drink alcohol. Tobacco:  reports that she has been smoking.  She started smoking about 48 years ago. She has a 129.00 pack-year smoking history. she has never used smokeless tobacco. Medical:  has a past medical history of Anxiety, Arthritis, Asthma, COPD (chronic obstructive pulmonary disease) ([place]), GERD (gastroesophageal reflux disease), and Hypertension. Surgical: Ms. Dalby  has a past surgical history that includes Rotator cuff repair (Right); Abdominal hysterectomy; Hernia repair; Foot surgery (Left); and carpel tunnel (Bilateral). Family: family history includes Breast cancer in her paternal aunt; Breast cancer (age of onset: 80) in her mother.  Constitutional Exam  General appearance: Well nourished, well developed, and well hydrated. In no apparent acute distress Vitals:   02/16/18 0935  BP: (!) 148/96  Pulse: 79  Temp: 98.1 F (36.7 C)  SpO2: 98%  Weight: 201 lb (91.2 kg)  Height: '5\' 2"'  (1.575 m)   BMI Assessment: Estimated body mass index is 36.76 kg/m as calculated from the following:   Height as of this encounter: '5\' 2"'  (1.575 m).   Weight as of this encounter: 201 lb (91.2 kg).  BMI interpretation table: BMI level Category Range association with higher incidence of chronic pain  <18  kg/m2 Underweight   18.5-24.9 kg/m2 Ideal body weight   25-29.9 kg/m2 Overweight Increased incidence by 20%  30-34.9 kg/m2 Obese (Class I) Increased incidence by 68%  35-39.9 kg/m2 Severe obesity (Class II) Increased incidence by 136%  >40 kg/m2 Extreme obesity (Class III) Increased incidence by 254%   BMI Readings from Last 4 Encounters:  02/16/18 36.76 kg/m  12/28/17 37.13 kg/m  05/12/17 36.03 kg/m  03/20/17 35.67 kg/m   Wt Readings from Last 4 Encounters:  02/16/18 201 lb (91.2 kg)  12/28/17 203 lb (92.1 kg)  05/12/17 197 lb (89.4 kg)  03/20/17 195 lb (88.5 kg)  Psych/Mental status: Alert, oriented x 3 (person, place, & time)       Eyes: PERLA Respiratory: No evidence of acute respiratory distress  Cervical Spine Area Exam  Skin & Axial Inspection: No masses, redness, edema, swelling, or associated skin lesions Alignment: Symmetrical Functional ROM: Unrestricted ROM      Stability: No instability detected Muscle Tone/Strength: Functionally intact. No obvious neuro-muscular anomalies detected. Sensory (Neurological): Unimpaired Palpation: No palpable anomalies              Upper Extremity (UE) Exam    Side: Right upper extremity  Side: Left upper extremity  Skin & Extremity Inspection: Skin color, temperature, and hair growth are WNL. No peripheral edema or cyanosis. No masses, redness, swelling, asymmetry, or associated skin lesions. No contractures.  Skin & Extremity Inspection: Skin color, temperature, and hair growth are WNL. No peripheral edema or cyanosis. No masses, redness, swelling, asymmetry, or associated skin lesions. No contractures.  Functional ROM: Unrestricted ROM          Functional ROM: Unrestricted ROM          Muscle Tone/Strength: Functionally intact. No obvious neuro-muscular anomalies detected.  Muscle Tone/Strength: Functionally intact. No obvious neuro-muscular anomalies detected.  Sensory (Neurological): Unimpaired          Sensory (Neurological):  Unimpaired          Palpation: No palpable anomalies              Palpation: No palpable anomalies              Specialized Test(s): Deferred         Specialized Test(s): Deferred  Thoracic Spine Area Exam  Skin & Axial Inspection: No masses, redness, or swelling Alignment: Symmetrical Functional ROM: Unrestricted ROM Stability: No instability detected Muscle Tone/Strength: Functionally intact. No obvious neuro-muscular anomalies detected. Sensory (Neurological): Unimpaired Muscle strength & Tone: No palpable anomalies  Lumbar Spine Area Exam  Skin & Axial Inspection: No masses, redness, or swelling.  The patient experienced several muscle spasms in the lower back and legs as I was doing my physical exam. Alignment: Symmetrical Functional ROM: Decreased ROM      Stability: No instability detected Muscle Tone/Strength: Functionally intact. No obvious neuro-muscular anomalies detected. Sensory (Neurological): Movement-associated pain Palpation: Complains of area being tender to palpation       Provocative Tests: Lumbar Hyperextension and rotation test: Positive bilaterally for facet joint pain. Lumbar Lateral bending test: evaluation deferred today       Patrick's Maneuver: Positive for bilateral S-I arthralgia              Gait & Posture Assessment  Ambulation: Patient ambulates using a walker Gait: Limited. Using assistive device to ambulate Posture: Antalgic   Lower Extremity Exam    Side: Right lower extremity  Side: Left lower extremity  Skin & Extremity Inspection: Skin color, temperature, and hair growth are WNL. No peripheral edema or cyanosis. No masses, redness, swelling, asymmetry, or associated skin lesions. No contractures.  Skin & Extremity Inspection: Skin color, temperature, and hair growth are WNL. No peripheral edema or cyanosis. No masses, redness, swelling, asymmetry, or associated skin lesions. No contractures.  Functional ROM: Decreased ROM for hip joint   Functional ROM: Unrestricted ROM for all joints of the lower extremity  Muscle Tone/Strength: Able to Toe-walk & Heel-walk without problems  Muscle Tone/Strength: Able to Toe-walk & Heel-walk without problems  Sensory (Neurological): Unimpaired  Sensory (Neurological): Unimpaired  Palpation: No palpable anomalies  Palpation: No palpable anomalies   Assessment & Plan  Primary Diagnosis & Pertinent Problem List: The primary encounter diagnosis was Chronic pain syndrome. 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), Lumbar facet syndrome (Bilateral) (R>L), Chronic sacroiliac joint pain (Bilateral) (R>L), Other specified dorsopathies, sacral and sacrococcygeal region, Chronic knee pain (Tertiary Area of Pain) (Bilateral) (L>R), Chronic ankle pain (Secondary Area of Pain) (Bilateral) (R>L), Osteoarthritis of ankle (Left), Disorder of skeletal system, Problems influencing health status, Pharmacologic therapy, Opiate use, Elevated C-reactive protein (CRP), Gastroesophageal reflux disease without esophagitis, Muscle cramps, Opioid-induced constipation (OIC), Coccygodynia, Sacrococcygeal pain, and Spondylosis without myelopathy or radiculopathy, sacral and sacrococcygeal region were also pertinent to this visit.  Visit Diagnosis: 1. Chronic pain syndrome   2. Chronic low back pain (Primary Area of Pain) (Bilateral) (R>L)   3. Levoscoliosis   4. Osteoarthritis of lumbar spine   5. DDD (degenerative disc disease), lumbar   6. Spondylosis without myelopathy or radiculopathy, lumbosacral region   7. Lumbar facet arthropathy   8. 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 Lumbar facet syndrome (Bilateral)  (R>L)   10. Chronic sacroiliac joint pain (Bilateral) (R>L)   11. Other specified dorsopathies, sacral and sacrococcygeal region   12. Chronic knee pain (Tertiary Area of Pain) (Bilateral) (L>R)   13. Chronic ankle pain (Secondary Area of Pain) (Bilateral) (R>L)   14. Osteoarthritis of ankle (Left)   15. Disorder of skeletal system  16. Problems influencing health status   17. Pharmacologic therapy   18. Opiate use   19. Elevated C-reactive protein (CRP)   20. Gastroesophageal reflux disease without esophagitis   21. Muscle cramps   22. Opioid-induced constipation (OIC)   23. Coccygodynia   24. Sacrococcygeal pain   25. 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) (Bilateral)  Osteoarthritis of lumbar spine  Osteoarthritis of ankle (Left)  Ddd (Degenerative Disc Disease), Lumbar  Muscle Cramps  Coccygodynia  Sacrococcygeal Pain  Spondylosis Without Myelopathy Or Radiculopathy, Sacral and Sacrococcygeal Region  Chronic low back pain (Primary Area of Pain) (Bilateral) (R>L)  Chronic ankle pain (Secondary Area of Pain) (Bilateral) (R>L)  Chronic knee pain (Tertiary Area of Pain) (Bilateral) (L>R)  Chronic Pain Syndrome  Opioid-induced constipation (OIC)  Elevated C-Reactive Protein (Crp)  Opiate Use  Pharmacologic Therapy  Disorder of Skeletal System  Problems Influencing Health Status  Gerd (Gastroesophageal Reflux Disease)    Plan of Care  Pharmacotherapy (Medications Ordered): Meds ordered this encounter  Medications  . Magnesium Oxide 500 MG CAPS    Sig:  Take 1 capsule (500 mg total) by mouth 2 (two) times daily at 8 am and 10 pm.    Dispense:  60 capsule    Refill:  2    Do not place medication on "Automatic Refill". Fill one day early if pharmacy is closed on scheduled refill date.  Marland Kitchen HYDROcodone-acetaminophen (NORCO/VICODIN) 5-325 MG tablet    Sig: Take 1 tablet by mouth daily as needed for severe pain.    Dispense:  30 tablet    Refill:  0    Do not place this medication, or any other prescription from our practice, on "Automatic Refill". Patient may have prescription filled one day early if pharmacy is closed on scheduled refill date. Do not fill until: 02/16/18 To last until: 03/18/18    Procedure Orders     Caudal Epidural Injection Lab Orders  No laboratory test(s) ordered today   Imaging Orders  No imaging studies ordered today   Referral Orders  No referral(s) requested today    Pharmacological management options:  Opioid Analgesics: We'll take over management today. See above orders Membrane stabilizer: We have discussed the possibility of optimizing this mode of therapy, if tolerated Muscle relaxant: We have discussed the possibility of a trial NSAID: We have discussed the possibility of a trial Other analgesic(s): To be determined at a later time   Interventional management options: Planned, scheduled, and/or pending:    1. Diagnostic (Midline) sacrococcygeal nerve block #1 (caudal epidural) under fluoroscopic guidance and IV sedation 2. Diagnostic bilateral lumbar facet block #1 under fluoroscopic guidance and IV sedation, at a later time.   Considering:   Diagnostic bilateral lumbar facet nerve block Possible bilateral lumbar facet RFA  Diagnostic bilateral sacroiliac joint block  Possible bilateral sacroiliac joint RFA  Diagnostic bilateral intra-articular knee injection  Diagnostic bilateral Hyalgan series  Diagnostic bilateral genicular Nerve block  Possible bilateral genicular RFA  Diagnostic bilateral  ankle injections    PRN Procedures:   None at this time   Provider-requested follow-up: Return for Procedure (w/ sedation): (ML) Sacroccoxygeal  NB.Marland Kitchen  No future appointments.  Primary Care Physician: [facility] [place] Location: [facility] Note by: Gaspar Cola, MD Date: 02/16/2018; Time: 11:06 AM

## 2018-02-16 NOTE — Patient Instructions (Addendum)
____________________________________________________________________________________________  Pain Scale  Introduction: The pain score used by this practice is the Verbal Numerical Rating Scale (VNRS-11). This is an 11-point scale. It is for adults and children 10 years or older. There are significant differences in how the pain score is reported, used, and applied. Forget everything you learned in the past and learn this scoring system.  General Information: The scale should reflect your current level of pain. Unless you are specifically asked for the level of your worst pain, or your average pain. If you are asked for one of these two, then it should be understood that it is over the past 24 hours.  Basic Activities of Daily Living (ADL): Personal hygiene, dressing, eating, transferring, and using restroom.  Instructions: Most patients tend to report their level of pain as a combination of two factors, their physical pain and their psychosocial pain. This last one is also known as "suffering" and it is reflection of how physical pain affects you socially and psychologically. From now on, report them separately. From this point on, when asked to report your pain level, report only your physical pain. Use the following table for reference.  Pain Clinic Pain Levels (0-5/10)  Pain Level Score  Description  No Pain 0   Mild pain 1 Nagging, annoying, but does not interfere with basic activities of daily living (ADL). Patients are able to eat, bathe, get dressed, toileting (being able to get on and off the toilet and perform personal hygiene functions), transfer (move in and out of bed or a chair without assistance), and maintain continence (able to control bladder and bowel functions). Blood pressure and heart rate are unaffected. A normal heart rate for a healthy adult ranges from 60 to 100 bpm (beats per minute).   Mild to moderate pain 2 Noticeable and distracting. Impossible to hide from other  people. More frequent flare-ups. Still possible to adapt and function close to normal. It can be very annoying and may have occasional stronger flare-ups. With discipline, patients may get used to it and adapt.   Moderate pain 3 Interferes significantly with activities of daily living (ADL). It becomes difficult to feed, bathe, get dressed, get on and off the toilet or to perform personal hygiene functions. Difficult to get in and out of bed or a chair without assistance. Very distracting. With effort, it can be ignored when deeply involved in activities.   Moderately severe pain 4 Impossible to ignore for more than a few minutes. With effort, patients may still be able to manage work or participate in some social activities. Very difficult to concentrate. Signs of autonomic nervous system discharge are evident: dilated pupils (mydriasis); mild sweating (diaphoresis); sleep interference. Heart rate becomes elevated (>115 bpm). Diastolic blood pressure (lower number) rises above 100 mmHg. Patients find relief in laying down and not moving.   Severe pain 5 Intense and extremely unpleasant. Associated with frowning face and frequent crying. Pain overwhelms the senses.  Ability to do any activity or maintain social relationships becomes significantly limited. Conversation becomes difficult. Pacing back and forth is common, as getting into a comfortable position is nearly impossible. Pain wakes you up from deep sleep. Physical signs will be obvious: pupillary dilation; increased sweating; goosebumps; brisk reflexes; cold, clammy hands and feet; nausea, vomiting or dry heaves; loss of appetite; significant sleep disturbance with inability to fall asleep or to remain asleep. When persistent, significant weight loss is observed due to the complete loss of appetite and sleep deprivation.  Blood  pressure and heart rate becomes significantly elevated. Caution: If elevated blood pressure triggers a pounding headache  associated with blurred vision, then the patient should immediately seek attention at an urgent or emergency care unit, as these may be signs of an impending stroke.    Emergency Department Pain Levels (6-10/10)  Emergency Room Pain 6 Severely limiting. Requires emergency care and should not be seen or managed at an outpatient pain management facility. Communication becomes difficult and requires great effort. Assistance to reach the emergency department may be required. Facial flushing and profuse sweating along with potentially dangerous increases in heart rate and blood pressure will be evident.   Distressing pain 7 Self-care is very difficult. Assistance is required to transport, or use restroom. Assistance to reach the emergency department will be required. Tasks requiring coordination, such as bathing and getting dressed become very difficult.   Disabling pain 8 Self-care is no longer possible. At this level, pain is disabling. The individual is unable to do even the most "basic" activities such as walking, eating, bathing, dressing, transferring to a bed, or toileting. Fine motor skills are lost. It is difficult to think clearly.   Incapacitating pain 9 Pain becomes incapacitating. Thought processing is no longer possible. Difficult to remember your own name. Control of movement and coordination are lost.   The worst pain imaginable 10 At this level, most patients pass out from pain. When this level is reached, collapse of the autonomic nervous system occurs, leading to a sudden drop in blood pressure and heart rate. This in turn results in a temporary and dramatic drop in blood flow to the brain, leading to a loss of consciousness. Fainting is one of the body's self defense mechanisms. Passing out puts the brain in a calmed state and causes it to shut down for a while, in order to begin the healing process.    Summary: 1. Refer to this scale when providing [place] with your pain level. 2. Be  accurate and careful when reporting your pain level. This will help with your care. 3. Over-reporting your pain level will lead to loss of credibility. 4. Even a level of 1/10 means that there is pain and will be treated at our facility. 5. High, inaccurate reporting will be documented as "Symptom Exaggeration", leading to loss of credibility and suspicions of possible secondary gains such as obtaining more narcotics, or wanting to appear disabled, for fraudulent reasons. 6. Only pain levels of 5 or below will be seen at our facility. 7. Pain levels of 6 and above will be sent to the Emergency Department and the appointment cancelled. ____________________________________________________________________________________________    ____________________________________________________________________________________________  Preparing for Procedure with Sedation  Instructions: . Oral Intake: Do not eat or drink anything for at least 8 hours prior to your procedure. . Transportation: Public transportation is not allowed. Bring an adult driver. The driver must be physically present in our waiting room before any procedure can be started. Marland Kitchen Physical Assistance: Bring an adult physically capable of assisting you, in the event you need help. This adult should keep you company at home for at least 6 hours after the procedure. . Blood Pressure Medicine: Take your blood pressure medicine with a sip of water the morning of the procedure. . Blood thinners:  . Diabetics on insulin: Notify the staff so that you can be scheduled 1st case in the morning. If your diabetes requires high dose insulin, take only  of your normal insulin dose the morning of the procedure  and notify the staff that you have done so. . Preventing infections: Shower with an antibacterial soap the morning of your procedure. . Build-up your immune system: Take 1000 mg of Vitamin C with every meal (3 times a day) the day prior to your  procedure. Marland Kitchen Antibiotics: Inform the staff if you have a condition or reason that requires you to take antibiotics before dental procedures. . Pregnancy: If you are pregnant, call and cancel the procedure. . Sickness: If you have a cold, fever, or any active infections, call and cancel the procedure. . Arrival: You must be in the facility at least 30 minutes prior to your scheduled procedure. . Children: Do not bring children with you. . Dress appropriately: Bring dark clothing that you would not mind if they get stained. . Valuables: Do not bring any jewelry or valuables.  Procedure appointments are reserved for interventional treatments only. Marland Kitchen No Prescription Refills. . No medication changes will be discussed during procedure appointments. . No disability issues will be discussed.  Remember:  Regular Business hours are:  Monday to Thursday 8:00 AM to 4:00 PM  Provider's Schedule: Delano Metz, MD:  Procedure days: Tuesday and Thursday 7:30 AM to 4:00 PM  Edward Jolly, MD:  Procedure days: Monday and Wednesday 7:30 AM to 4:00 PM ____________________________________________________________________________________________    For Cramps: 1. Gatorade 1 (8oz) glass w/ meals 2. Magnesium 500 mg twice a day (breakfast and before bedtime) 3. Take one multivitamin every day with breakfast 4. Tonic water (w/ quinine) 1 (8oz) glass at bedtime  ____________________________________________________________________________________________  Medication Rules  Applies to: All patients receiving prescriptions (written or electronic).  Pharmacy of record: Pharmacy where electronic prescriptions will be sent. If written prescriptions are taken to a different pharmacy, please inform the nursing staff. The pharmacy listed in the electronic medical record should be the one where you would like electronic prescriptions to be sent.  Prescription refills: Only during scheduled appointments.  Applies to both, written and electronic prescriptions.  NOTE: The following applies primarily to controlled substances (Opioid* Pain Medications).   Patient's responsibilities: 1. Pain Pills: Bring all pain pills to every appointment (except for procedure appointments). 2. Pill Bottles: Bring pills in original pharmacy bottle. Always bring newest bottle. Bring bottle, even if empty. 3. Medication refills: You are responsible for knowing and keeping track of what medications you need refilled. The day before your appointment, write a list of all prescriptions that need to be refilled. Bring that list to your appointment and give it to the admitting nurse. Prescriptions will be written only during appointments. If you forget a medication, it will not be "Called in", "Faxed", or "electronically sent". You will need to get another appointment to get these prescribed. 4. Prescription Accuracy: You are responsible for carefully inspecting your prescriptions before leaving our office. Have the discharge nurse carefully go over each prescription with you, before taking them home. Make sure that your name is accurately spelled, that your address is correct. Check the name and dose of your medication to make sure it is accurate. Check the number of pills, and the written instructions to make sure they are clear and accurate. Make sure that you are given enough medication to last until your next medication refill appointment. 5. Taking Medication: Take medication as prescribed. Never take more pills than instructed. Never take medication more frequently than prescribed. Taking less pills or less frequently is permitted and encouraged, when it comes to controlled substances (written prescriptions).  6. Inform other  Doctors: Always inform, all of your healthcare providers, of all the medications you take. 7. Pain Medication from other Providers: You are not allowed to accept any additional pain medication from any  other Doctor or Healthcare provider. There are two exceptions to this rule. (see below) In the event that you require additional pain medication, you are responsible for notifying us, as stated below. 8. Medication Agreement: You are responsible for carefully reading and following our Medication Agreement. This must be signed before receiving any prescriptions from our practice. Safely store a copy of your signed Agreement. Violations to the Agreement will result in no further prescriptions. (Additional copies of our Medication Agreement are available upon request.) 9. Laws, Rules, & Regulations: All patients are expected to follow all [address] and [employer], Rules, [place] [employer]. Ignorance of the Laws does not constitute a valid excuse. The use of any illegal substances is prohibited. 10. Adopted CDC guidelines & recommendations: Target dosing levels will be at or below 60 MME/day. Use of benzodiazepines** is not recommended.  Exceptions: There are only two exceptions to the rule of not receiving pain medications from other Healthcare Providers. 1. Exception #1 (Emergencies): In the event of an emergency (i.e.: accident requiring emergency care), you are allowed to receive additional pain medication. However, you are responsible for: As soon as you are able, call our office [phone], at any time of the day or night, and leave a message stating your name, the date and nature of the emergency, and the name and dose of the medication prescribed. In the event that your call is answered by a member of our staff, make sure to document and save the date, time, and the name of the person that took your information.  2. Exception #2 (Planned Surgery): In the event that you are scheduled by another doctor or dentist to have any type of surgery or procedure, you are allowed (for a period no longer than 30 days), to receive additional pain medication, for the acute post-op pain. However, in this case,  you are responsible for picking up a copy of our "Post-op Pain Management for Surgeons" handout, and giving it to your surgeon or dentist. This document is available at our office, and does not require an appointment to obtain it. Simply go to our office during business hours (Monday-Thursday from 8:00 AM to 4:00 PM) (Friday 8:00 AM to 12:00 Noon) or if you have a scheduled appointment with [place], prior to your surgery, and ask for it by name. In addition, you will need to provide [place] with your name, name of your surgeon, type of surgery, and date of procedure or surgery.  *Opioid medications include: morphine, codeine, oxycodone, oxymorphone, hydrocodone, hydromorphone, meperidine, tramadol, tapentadol, buprenorphine, fentanyl, methadone. **Benzodiazepine medications include: diazepam (Valium), alprazolam (Xanax), clonazepam (Klonopine), lorazepam (Ativan), clorazepate (Tranxene), chlordiazepoxide (Librium), estazolam (Prosom), oxazepam (Serax), temazepam (Restoril), triazolam (Halcion) (Last updated: 01/27/2018) ____________________________________________________________________________________________  ____________________________________________________________________________________________  Medication Recommendations and Reminders  Applies to: All patients receiving prescriptions (written and/or electronic).  Medication Rules & Regulations: These rules and regulations exist for your safety and that of others. They are not flexible and neither are we. Dismissing or ignoring them will be considered "non-compliance" with medication therapy, resulting in complete and irreversible termination of such therapy. (See document titled "Medication Rules" for more details.) In all conscience, because of safety reasons, we cannot continue providing a therapy where the patient does not follow instructions.  Pharmacy of record:   Definition: This is the pharmacy  where your electronic prescriptions will be  sent.   We do not endorse any particular pharmacy.  You are not restricted in your choice of pharmacy.  The pharmacy listed in the electronic medical record should be the one where you want electronic prescriptions to be sent.  If you choose to change pharmacy, simply notify our nursing staff of your choice of new pharmacy.  Recommendations:  Keep all of your pain medications in a safe place, under lock and key, even if you live alone.   After you fill your prescription, take 1 week's worth of pills and put them away in a safe place. You should keep a separate, properly labeled bottle for this purpose. The remainder should be kept in the original bottle. Use this as your primary supply, until it runs out. Once it's gone, then you know that you have 1 week's worth of medicine, and it is time to come in for a prescription refill. If you do this correctly, it is unlikely that you will ever run out of medicine.  To make sure that the above recommendation works, it is very important that you make sure your medication refill appointments are scheduled at least 1 week before you run out of medicine. To do this in an effective manner, make sure that you do not leave the office without scheduling your next medication management appointment. Always ask the nursing staff to show you in your prescription , when your medication will be running out. Then arrange for the receptionist to get you a return appointment, at least 7 days before you run out of medicine. Do not wait until you have 1 or 2 pills left, to come in. This is very poor planning and does not take into consideration that we may need to cancel appointments due to bad weather, sickness, or emergencies affecting our staff.  Prescription refills and/or changes in medication(s):   Prescription refills, and/or changes in dose or medication, will be conducted only during scheduled medication management appointments. (Applies to both, written and  electronic prescriptions.)  No refills on procedure days. No medication will be changed or started on procedure days. No changes, adjustments, and/or refills will be conducted on a procedure day. Doing so will interfere with the diagnostic portion of the procedure.  No phone refills. No medications will be "called into the pharmacy".  No Fax refills.  No weekend refills.  No [place]iday refills.  No after hours refills.  Remember:  Business hours are:  Monday to Thursday 8:00 AM to 4:00 PM Provider's Schedule: Thad Ranger, NP - Appointments are:  Medication management: Monday to Thursday 8:00 AM to 4:00 PM Delano Metz, MD - Appointments are:  Medication management: Monday and Wednesday 8:00 AM to 4:00 PM Procedure day: Tuesday and Thursday 7:30 AM to 4:00 PM Edward Jolly, MD - Appointments are:  Medication management: Tuesday and Thursday 8:00 AM to 4:00 PM Procedure day: Monday and Wednesday 7:30 AM to 4:00 PM (Last update: 01/27/2018) ____________________________________________________________________________________________  Vitamin D3 2000 IU with breakfast.

## 2018-02-17 ENCOUNTER — Telehealth: Payer: Self-pay | Admitting: *Deleted

## 2018-02-17 ENCOUNTER — Telehealth: Payer: Self-pay

## 2018-02-17 NOTE — Telephone Encounter (Signed)
LVM for pharmacy to return call.

## 2018-02-17 NOTE — Telephone Encounter (Signed)
[facility] He has ? About RX

## 2018-02-17 NOTE — Telephone Encounter (Signed)
Patient phoned in for scheduling and then needed procedureal instructions.  Patient concerned that she would need breasthing treatment prior to procedure since she had this with rotator cuff surgery.  Explained that this is not general anesthesia and that if her COPD is controlled that we would not need to do this for this type procedure.  Other instructions reviewed, driver, NPO and 24 hours post sedation restrictions.  Patient verbalizes instruction of all.  NO blood thinners noted.

## 2018-02-24 ENCOUNTER — Ambulatory Visit (HOSPITAL_BASED_OUTPATIENT_CLINIC_OR_DEPARTMENT_OTHER): Payer: 59 | Admitting: Pain Medicine

## 2018-02-24 ENCOUNTER — Ambulatory Visit
Admission: RE | Admit: 2018-02-24 | Discharge: 2018-02-24 | Disposition: A | Discharge status: Home / Self Care | Payer: 59 | Source: Ambulatory Visit | Attending: Pain Medicine | Admitting: Pain Medicine

## 2018-02-24 ENCOUNTER — Encounter: Payer: Self-pay | Admitting: Pain Medicine

## 2018-02-24 ENCOUNTER — Other Ambulatory Visit: Payer: Self-pay

## 2018-02-24 VITALS — BP 143/90 | HR 92 | Temp 98.0°F | Resp 16 | Ht 62.0 in | Wt 202.0 lb

## 2018-02-24 DIAGNOSIS — M533 Sacrococcygeal disorders, not elsewhere classified: Secondary | ICD-10-CM

## 2018-02-24 DIAGNOSIS — M545 Low back pain, unspecified: Secondary | ICD-10-CM

## 2018-02-24 DIAGNOSIS — G8929 Other chronic pain: Secondary | ICD-10-CM | POA: Diagnosis not present

## 2018-02-24 DIAGNOSIS — M5136 Other intervertebral disc degeneration, lumbar region: Secondary | ICD-10-CM

## 2018-02-24 DIAGNOSIS — Z882 Allergy status to sulfonamides status: Secondary | ICD-10-CM | POA: Diagnosis not present

## 2018-02-24 DIAGNOSIS — Z881 Allergy status to other antibiotic agents status: Secondary | ICD-10-CM | POA: Insufficient documentation

## 2018-02-24 DIAGNOSIS — M5388 Other specified dorsopathies, sacral and sacrococcygeal region: Secondary | ICD-10-CM

## 2018-02-24 DIAGNOSIS — M51369 Other intervertebral disc degeneration, lumbar region without mention of lumbar back pain or lower extremity pain: Secondary | ICD-10-CM

## 2018-02-24 MED ORDER — IOPAMIDOL (ISOVUE-M 200) INJECTION 41%
10.0000 mL | Freq: Once | INTRAMUSCULAR | Status: AC
  Administered 2018-02-24: 10 mL via EPIDURAL
  Filled 2018-02-24: qty 10

## 2018-02-24 MED ORDER — FENTANYL CITRATE (PF) 100 MCG/2ML IJ SOLN
25.0000 ug | INTRAMUSCULAR | Status: DC | PRN
  Administered 2018-02-24: 50 ug via INTRAVENOUS
  Filled 2018-02-24: qty 2

## 2018-02-24 MED ORDER — MIDAZOLAM HCL 5 MG/5ML IJ SOLN
1.0000 mg | INTRAMUSCULAR | Status: DC | PRN
  Administered 2018-02-24: 2 mg via INTRAVENOUS
  Filled 2018-02-24: qty 5

## 2018-02-24 MED ORDER — LACTATED RINGERS IV SOLN
1000.0000 mL | Freq: Once | INTRAVENOUS | Status: AC
  Administered 2018-02-24: 1000 mL via INTRAVENOUS

## 2018-02-24 MED ORDER — SODIUM CHLORIDE 0.9% FLUSH
2.0000 mL | Freq: Once | INTRAVENOUS | Status: AC
  Administered 2018-02-24: 2 mL

## 2018-02-24 MED ORDER — LIDOCAINE HCL 2 % IJ SOLN
20.0000 mL | Freq: Once | INTRAMUSCULAR | Status: AC
  Administered 2018-02-24: 400 mg
  Filled 2018-02-24: qty 40

## 2018-02-24 MED ORDER — ROPIVACAINE HCL 2 MG/ML IJ SOLN
2.0000 mL | Freq: Once | INTRAMUSCULAR | Status: AC
  Administered 2018-02-24: 10 mL via EPIDURAL
  Filled 2018-02-24: qty 10

## 2018-02-24 MED ORDER — TRIAMCINOLONE ACETONIDE 40 MG/ML IJ SUSP
40.0000 mg | Freq: Once | INTRAMUSCULAR | Status: AC
  Administered 2018-02-24: 40 mg
  Filled 2018-02-24: qty 1

## 2018-02-24 MED ORDER — SODIUM CHLORIDE 0.9 % IJ SOLN
INTRAMUSCULAR | Status: AC
  Filled 2018-02-24: qty 10

## 2018-02-24 NOTE — Patient Instructions (Signed)

## 2018-02-24 NOTE — Progress Notes (Signed)
Patient's Name: Sandra Strong  MRN: [id number]  Referring Provider: Delano Metz, MD  DOB: [birth year]  [facility]P: [facility]  DOS: 02/24/2018  Note by: Oswaldo Done, MD  Service setting: Ambulatory outpatient  Specialty: Interventional Pain Management  Patient type: Established  Location: [facility] (AMB) Pain Management Facility  Visit type: Interventional Procedure   Primary Reason for Visit: Interventional Pain Management Treatment. CC: Back Pain (low)  Procedure:  Anesthesia, Analgesia, Anxiolysis:  Type: Diagnostic Epidural Steroid Injection + Diagnostic Epidurogram Region: Caudal Level: Sacrococcygeal   Laterality: Midline aiming at the right  Type: Moderate (Conscious) Sedation combined with Local Anesthesia Indication(s): Analgesia and Anxiety Route: Intravenous (IV) IV Access: Secured Sedation: Meaningful verbal contact was maintained at all times during the procedure  Local Anesthetic: Lidocaine 1-2%   Indications: 1. DDD (degenerative disc disease), lumbar   2. Coccygodynia   3. Sacrococcygeal pain   4. Other specified dorsopathies, sacral and sacrococcygeal region   5. Chronic low back pain (Primary Area of Pain) (Bilateral) (R>L)    Pain Score: Pre-procedure: 2 /10 Post-procedure: 0-No pain/10  Pre-op Assessment:  Sandra Strong is a 62 y.o. (year old), female patient, seen today for interventional treatment. She  has a past surgical history that includes Rotator cuff repair (Right); Abdominal hysterectomy; Hernia repair; Foot surgery (Left); and carpel tunnel (Bilateral). Sandra Strong has a current medication list which includes the following prescription(s): acetaminophen, albuterol, albuterol, cetirizine, vitamin d3, diphenhydramine, etodolac, fluticasone, fluticasone, hydrocodone-acetaminophen, magnesium oxide, montelukast, multivitamin with minerals, omeprazole, probiotic product, and sertraline, and the following Facility-Administered Medications:  fentanyl and midazolam. Her primarily concern today is the Back Pain (low)  Initial Vital Signs:  Pulse Rate: 78 Temp: 98.1 F (36.7 C) Resp: 16 BP: 137/84 SpO2: 98 %  BMI: Estimated body mass index is 36.95 kg/m as calculated from the following:   Height as of this encounter: 5\' 2"  (1.575 m).   Weight as of this encounter: 202 lb (91.6 kg).  Risk Assessment: Allergies: Reviewed. She is allergic to erythromycin and sulfa antibiotics.  Allergy Precautions: None required Coagulopathies: Reviewed. None identified.  Blood-thinner therapy: None at this time Active Infection(s): Reviewed. None identified. Sandra Strong is afebrile  Site Confirmation: Sandra Strong was asked to confirm the procedure and laterality before marking the site Procedure checklist: Completed Consent: Before the procedure and under the influence of no sedative(s), amnesic(s), or anxiolytics, the patient was informed of the treatment options, risks and possible complications. To fulfill our ethical and legal obligations, as recommended by the American Medical Association's Code of Ethics, I have informed the patient of my clinical impression; the nature and purpose of the treatment or procedure; the risks, benefits, and possible complications of the intervention; the alternatives, including doing nothing; the risk(s) and benefit(s) of the alternative treatment(s) or procedure(s); and the risk(s) and benefit(s) of doing nothing. The patient was provided information about the general risks and possible complications associated with the procedure. These may include, but are not limited to: failure to achieve desired goals, infection, bleeding, organ or nerve damage, allergic reactions, paralysis, and death. In addition, the patient was informed of those risks and complications associated to Spine-related procedures, such as failure to decrease pain; infection (i.e.: Meningitis, epidural or intraspinal abscess); bleeding (i.e.: epidural  hematoma, subarachnoid hemorrhage, or any other type of intraspinal or peri-dural bleeding); organ or nerve damage (i.e.: Any type of peripheral nerve, nerve root, or spinal cord injury) with subsequent damage to sensory, motor, and/or autonomic systems,  resulting in permanent pain, numbness, and/or weakness of one or several areas of the body; allergic reactions; (i.e.: anaphylactic reaction); and/or death. Furthermore, the patient was informed of those risks and complications associated with the medications. These include, but are not limited to: allergic reactions (i.e.: anaphylactic or anaphylactoid reaction(s)); adrenal axis suppression; blood sugar elevation that in diabetics may result in ketoacidosis or comma; water retention that in patients with history of congestive heart failure may result in shortness of breath, pulmonary edema, and decompensation with resultant heart failure; weight gain; swelling or edema; medication-induced neural toxicity; particulate matter embolism and blood vessel occlusion with resultant organ, and/or nervous system infarction; and/or aseptic necrosis of one or more joints. Finally, the patient was informed that Medicine is not an exact science; therefore, there is also the possibility of unforeseen or unpredictable risks and/or possible complications that may result in a catastrophic outcome. The patient indicated having understood very clearly. We have given the patient no guarantees and we have made no promises. Enough time was given to the patient to ask questions, all of which were answered to the patient's satisfaction. Sandra Strong has indicated that she wanted to continue with the procedure. Attestation: I, the ordering provider, attest that I have discussed with the patient the benefits, risks, side-effects, alternatives, likelihood of achieving goals, and potential problems during recovery for the procedure that I have provided informed consent. Date  Time: 02/24/2018   9:42 AM  Pre-Procedure Preparation:  Monitoring: As per clinic protocol. Respiration, ETCO2, SpO2, BP, heart rate and rhythm monitor placed and checked for adequate function Safety Precautions: Patient was assessed for positional comfort and pressure points before starting the procedure. Time-out: I initiated and conducted the "Time-out" before starting the procedure, as per protocol. The patient was asked to participate by confirming the accuracy of the "Time Out" information. Verification of the correct person, site, and procedure were performed and confirmed by me, the nursing staff, and the patient. "Time-out" conducted as per Joint Commission's Universal Protocol (UP.01.01.01). Time: 1108  Description of Procedure Process:   Position: Prone Target Area: Caudal Epidural Canal. Approach: Midline approach. Area Prepped: Entire Posterior Sacrococcygeal Region Prepping solution: ChloraPrep (2% chlorhexidine gluconate and 70% isopropyl alcohol) Safety Precautions: Aspiration looking for blood return was conducted prior to all injections. At no point did we inject any substances, as a needle was being advanced. No attempts were made at seeking any paresthesias. Safe injection practices and needle disposal techniques used. Medications properly checked for expiration dates. SDV (single dose vial) medications used. Description of the Procedure: Protocol guidelines were followed. The patient was placed in position over the fluoroscopy table. The target area was identified and the area prepped in the usual manner. Skin desensitized using vapocoolant spray. Skin & deeper tissues infiltrated with local anesthetic. Appropriate amount of time allowed to pass for local anesthetics to take effect. The procedure needles were then advanced to the target area. Proper needle placement secured. Negative aspiration confirmed. Solution injected in intermittent fashion, asking for systemic symptoms every 0.5cc of  injectate. The needles were then removed and the area cleansed, making sure to leave some of the prepping solution back to take advantage of its long term bactericidal properties. Vitals:   02/24/18 1115 02/24/18 1124 02/24/18 1134 02/24/18 1143  BP: (!) 140/92 (!) 145/92 128/81 (!) 143/90  Pulse: 92     Resp: 17 18 15 16   Temp:  98 F (36.7 C)    TempSrc:  Temporal    SpO2:  95% 96% 99% 99%  Weight:      Height:        Start Time: 1108 hrs. End Time: 1115 hrs. Materials:  Needle(s) Type: Epidural needle Gauge: 17G Length: 3.5-in Medication(s): Please see orders for medications and dosing details.  Imaging Guidance (Spinal):  Type of Imaging Technique: Fluoroscopy Guidance (Spinal) Indication(s): Assistance in needle guidance and placement for procedures requiring needle placement in or near specific anatomical locations not easily accessible without such assistance. Exposure Time: Please see nurses notes. Contrast: Before injecting any contrast, we confirmed that the patient did not have an allergy to iodine, shellfish, or radiological contrast. Once satisfactory needle placement was completed at the desired level, radiological contrast was injected. Contrast injected under live fluoroscopy. No contrast complications. See chart for type and volume of contrast used. Fluoroscopic Guidance: I was personally present during the use of fluoroscopy. "Tunnel Vision Technique" used to obtain the best possible view of the target area. Parallax error corrected before commencing the procedure. "Direction-depth-direction" technique used to introduce the needle under continuous pulsed fluoroscopy. Once target was reached, antero-posterior, oblique, and lateral fluoroscopic projection used confirm needle placement in all planes. Images permanently stored in EMR. Interpretation: I personally interpreted the imaging intraoperatively. Adequate needle placement confirmed in multiple planes. Appropriate  spread of contrast into desired area was observed. No evidence of afferent or efferent intravascular uptake. No intrathecal or subarachnoid spread observed. Permanent images saved into the patient's record.  Diagnostic Epidurogram:  Contrast: Before injecting any contrast, we confirmed that the patient did not have an allergy to iodine, shellfish, or radiological contrast. For accuracy purposes, contrast was injected under live fluoroscopy. Study personally interpreted intraoparatively. Type: Non-ionic, water soluble, hypoallergenic, myelogram-compatible, radiological contrast used. Please see orders and nurses note for specific choice of contrast. Volume: Please see nurses note for injected volume.  Observations:  Spinal Alignment: Adequate       Vertebral body: Intact Lamina: Intact Disc: Disc hight preserved Facet: Within Normal Limits.        Hardware: None  Spread: Appropriate epidural spread of contrast Anterior: Adequate Posterior: Adequate Superior (cephalad): Adequate Inferior (caudad): Adequate Right lateral: Adequate Left lateral: Adequate Plica medialis dorsalis: Medially aligned Nerve root(s): Adequate  Epidural extravasation: None observed Intrathecal: No intrathecal spread identified Subarachnoid: No subarachnoid spread pattern observed Vascular: No evidence of afferent or efferent intravascular uptake  Impression: Technically successful epidurogram. See above for details Note: Hard copies saved to EMR.  Antibiotic Prophylaxis:   Anti-infectives (From admission, onward)   None     Indication(s): None identified  Post-operative Assessment:  Post-procedure Vital Signs:  Pulse Rate: 92 Temp: 98 F (36.7 C) Resp: 16 BP: (!) 143/90 SpO2: 99 %  EBL: None  Complications: No immediate post-treatment complications observed by team, or reported by patient.  Note: The patient tolerated the entire procedure well. A repeat set of vitals were taken after the  procedure and the patient was kept under observation following institutional policy, for this type of procedure. Post-procedural neurological assessment was performed, showing return to baseline, prior to discharge. The patient was provided with post-procedure discharge instructions, including a section on how to identify potential problems. Should any problems arise concerning this procedure, the patient was given instructions to immediately contact us, at any time, without hesitation. In any case, we plan to contact the patient by telephone for a follow-up status report regarding this interventional procedure.  Comments:  No additional relevant information.  Plan of Care    Imaging  Orders     DG C-Arm 1-60 Min-No Report  Procedure Orders     Caudal Epidural Injection  Medications ordered for procedure: Meds ordered this encounter  Medications  . iopamidol (ISOVUE-M) 41 % intrathecal injection 10 mL    Must be Myelogram-compatible. If not available, you may substitute with a water-soluble, non-ionic, hypoallergenic, myelogram-compatible radiological contrast medium.  Marland Kitchen. lidocaine (XYLOCAINE) 2 % (with pres) injection 400 mg  . midazolam (VERSED) 5 MG/5ML injection 1-2 mg    Make sure Flumazenil is available in the pyxis when using this medication. If oversedation occurs, administer 0.2 mg IV over 15 sec. If after 45 sec no response, administer 0.2 mg again over 1 min; may repeat at 1 min intervals; not to exceed 4 doses (1 mg)  . fentaNYL (SUBLIMAZE) injection 25-50 mcg    Make sure Narcan is available in the pyxis when using this medication. In the event of respiratory depression (RR< 8/min): Titrate NARCAN (naloxone) in increments of 0.1 to 0.2 mg IV at 2-3 minute intervals, until desired degree of reversal.  . lactated ringers infusion 1,000 mL  . sodium chloride flush (NS) 0.9 % injection 2 mL  . ropivacaine (PF) 2 mg/mL (0.2%) (NAROPIN) injection 2 mL  . triamcinolone acetonide  (KENALOG-40) injection 40 mg   Medications administered: We administered iopamidol, lidocaine, midazolam, fentaNYL, lactated ringers, sodium chloride flush, ropivacaine (PF) 2 mg/mL (0.2%), and triamcinolone acetonide.  See the medical record for exact dosing, route, and time of administration.  New Prescriptions   No medications on file   Disposition: Discharge home  Discharge Date & Time: 02/24/2018; 1151 hrs.   Physician-requested Follow-up: Return for post-procedure eval (2 wks), w/ Dr. Laban EmperorNaveira.  Future Appointments  Date Time Provider Department Center  03/21/2018  8:15 AM Delano MetzNaveira, Amely Voorheis, MD [facility] None   Primary [facility] Physician: [facility] Note by: Oswaldo DoneFrancisco A Woodroe Vogan, MD Date: 02/24/2018; Time: 12:35 PM  Disclaimer:  Medicine is not an exact science. The only guarantee in medicine is that nothing is guaranteed. It is important to note that the decision to proceed with this intervention was based on the information collected from the patient. The Data and conclusions were drawn from the patient's questionnaire, the interview, and the physical examination. Because the information was provided in large part by the patient, it cannot be guaranteed that it has not been purposely or unconsciously manipulated. Every effort has been made to obtain as much relevant data as possible for this evaluation. It is important to note that the conclusions that lead to this procedure are derived in large part from the available data. Always take into account that the treatment will also be dependent on availability of resources and existing treatment guidelines, considered by other Pain Management Practitioners as being common knowledge and practice, at the time of the intervention. For Medico-Legal purposes, it is also important to point out that variation in procedural techniques and pharmacological choices are the  acceptable norm. The indications, contraindications, technique, and results of the above procedure should only be interpreted and judged by a Board-Certified Interventional Pain Specialist with extensive familiarity and expertise in the same exact procedure and technique.

## 2018-02-24 NOTE — Progress Notes (Signed)
Safety precautions to be maintained throughout the outpatient stay will include: orient to surroundings, keep bed in low position, maintain call bell within reach at all times, provide assistance with transfer out of bed and ambulation.  

## 2018-02-25 ENCOUNTER — Telehealth: Payer: Self-pay

## 2018-02-25 NOTE — Telephone Encounter (Signed)
Post procedure phone call.  Patient states she is doing well.  

## 2018-03-21 ENCOUNTER — Other Ambulatory Visit: Payer: Self-pay

## 2018-03-21 ENCOUNTER — Ambulatory Visit: Payer: 59 | Attending: Pain Medicine | Admitting: Pain Medicine

## 2018-03-21 ENCOUNTER — Ambulatory Visit: Payer: 59 | Admitting: Pain Medicine

## 2018-03-21 ENCOUNTER — Encounter: Payer: Self-pay | Admitting: Pain Medicine

## 2018-03-21 VITALS — BP 137/89 | HR 72 | Temp 98.4°F | Resp 18 | Ht 62.0 in | Wt 200.0 lb

## 2018-03-21 DIAGNOSIS — I10 Essential (primary) hypertension: Secondary | ICD-10-CM | POA: Diagnosis not present

## 2018-03-21 DIAGNOSIS — M25571 Pain in right ankle and joints of right foot: Secondary | ICD-10-CM | POA: Diagnosis not present

## 2018-03-21 DIAGNOSIS — Z5181 Encounter for therapeutic drug level monitoring: Secondary | ICD-10-CM | POA: Insufficient documentation

## 2018-03-21 DIAGNOSIS — J449 Chronic obstructive pulmonary disease, unspecified: Secondary | ICD-10-CM | POA: Diagnosis not present

## 2018-03-21 DIAGNOSIS — M47896 Other spondylosis, lumbar region: Secondary | ICD-10-CM | POA: Diagnosis not present

## 2018-03-21 DIAGNOSIS — M545 Low back pain: Secondary | ICD-10-CM | POA: Insufficient documentation

## 2018-03-21 DIAGNOSIS — M533 Sacrococcygeal disorders, not elsewhere classified: Secondary | ICD-10-CM | POA: Diagnosis not present

## 2018-03-21 DIAGNOSIS — G8929 Other chronic pain: Secondary | ICD-10-CM

## 2018-03-21 DIAGNOSIS — K219 Gastro-esophageal reflux disease without esophagitis: Secondary | ICD-10-CM | POA: Insufficient documentation

## 2018-03-21 DIAGNOSIS — M25572 Pain in left ankle and joints of left foot: Secondary | ICD-10-CM

## 2018-03-21 DIAGNOSIS — G894 Chronic pain syndrome: Secondary | ICD-10-CM | POA: Insufficient documentation

## 2018-03-21 DIAGNOSIS — M19072 Primary osteoarthritis, left ankle and foot: Secondary | ICD-10-CM | POA: Diagnosis not present

## 2018-03-21 DIAGNOSIS — M47818 Spondylosis without myelopathy or radiculopathy, sacral and sacrococcygeal region: Secondary | ICD-10-CM | POA: Diagnosis not present

## 2018-03-21 DIAGNOSIS — M47816 Spondylosis without myelopathy or radiculopathy, lumbar region: Secondary | ICD-10-CM

## 2018-03-21 DIAGNOSIS — Z882 Allergy status to sulfonamides status: Secondary | ICD-10-CM | POA: Diagnosis not present

## 2018-03-21 DIAGNOSIS — M47817 Spondylosis without myelopathy or radiculopathy, lumbosacral region: Secondary | ICD-10-CM | POA: Diagnosis not present

## 2018-03-21 DIAGNOSIS — Z881 Allergy status to other antibiotic agents status: Secondary | ICD-10-CM | POA: Insufficient documentation

## 2018-03-21 DIAGNOSIS — F17210 Nicotine dependence, cigarettes, uncomplicated: Secondary | ICD-10-CM | POA: Insufficient documentation

## 2018-03-21 DIAGNOSIS — F419 Anxiety disorder, unspecified: Secondary | ICD-10-CM | POA: Diagnosis not present

## 2018-03-21 DIAGNOSIS — M25561 Pain in right knee: Secondary | ICD-10-CM | POA: Diagnosis not present

## 2018-03-21 DIAGNOSIS — Z79899 Other long term (current) drug therapy: Secondary | ICD-10-CM | POA: Diagnosis not present

## 2018-03-21 DIAGNOSIS — M47897 Other spondylosis, lumbosacral region: Secondary | ICD-10-CM | POA: Insufficient documentation

## 2018-03-21 DIAGNOSIS — Z79891 Long term (current) use of opiate analgesic: Secondary | ICD-10-CM | POA: Diagnosis not present

## 2018-03-21 DIAGNOSIS — M5136 Other intervertebral disc degeneration, lumbar region: Secondary | ICD-10-CM

## 2018-03-21 DIAGNOSIS — M25562 Pain in left knee: Secondary | ICD-10-CM | POA: Insufficient documentation

## 2018-03-21 NOTE — Progress Notes (Signed)
Safety precautions to be maintained throughout the outpatient stay will include: orient to surroundings, keep bed in low position, maintain call bell within reach at all times, provide assistance with transfer out of bed and ambulation.  

## 2018-03-21 NOTE — Progress Notes (Signed)
Patient's Name: Sandra Strong  MRN: [id number]  Referring Provider: [facility]  DOB: 03-01-1956  PCP: [facility], [place]  DOS: 03/21/2018  Note by: Gaspar Cola, MD  Service setting: Ambulatory outpatient  Specialty: Interventional Pain Management  Location: [facility] (AMB) Pain Management Facility    Patient type: Established   Primary Reason(s) for Visit: Encounter for post-procedure evaluation of chronic illness with mild to moderate exacerbation CC: Pain (tailbone)  HPI  Sandra Strong is a 62 y.o. year old, female patient, who comes today for a post-procedure evaluation. She has Chronic low back pain (Primary Area of Pain) (Bilateral) (R>L); Chronic ankle pain (Secondary Area of Pain) (Bilateral) (R>L); Chronic knee pain (Tertiary Area of Pain) (Bilateral) (L>R); Chronic pain syndrome; Opiate use; Pharmacologic therapy; Disorder of skeletal system; Problems influencing health status; Elevated C-reactive protein (CRP); Chronic sacroiliac joint pain (Bilateral) (R>L); Spondylosis without myelopathy or radiculopathy, lumbosacral region; Lumbar facet syndrome (Bilateral) (R>L); Other specified dorsopathies, sacral and sacrococcygeal region; Levoscoliosis; Lumbar facet arthropathy; 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 Her primarily concern today is the Pain (tailbone)  Pain Assessment: Location: Lower Back Radiating: tailbone Onset: More than a month ago Duration: Chronic pain Quality: Pressure Severity: 1 /10 (self-reported pain score)  Note: Reported level is compatible with observation.             A 1/10  is viewed as "Mild" and described as nagging, annoying, but not interfering with basic activities of daily living (ADL). Sandra Strong is able to eat, bathe, get dressed, do toileting (being able to get on and off the toilet and perform personal hygiene functions), transfer (move in and out of bed or a chair without assistance), and maintain continence (able to control bladder and bowel functions). Physiologic parameters such as blood pressure and heart rate apear wnl.       When using our objective Pain Scale, levels between 6 and 10/10 are said to belong in an emergency room, as it progressively worsens from a 6/10, described as severely limiting, requiring emergency care not usually available at an outpatient pain management facility. At a 6/10 level, communication becomes difficult and requires great effort. Assistance to reach the emergency department may be required. Facial flushing and profuse sweating along with potentially dangerous increases in heart rate and blood pressure will be evident. Timing: Constant Modifying factors: rest  Sandra Strong comes in today for post-procedure evaluation after the treatment done on 02/24/2018.  Further details on both, my assessment(s), as well as the proposed treatment plan, please see below.  Post-Procedure Assessment  02/24/2018 Procedure: Diagnostic right-sided caudal epidural steroid injection + epidurogram #1 under fluoroscopic guidance and IV sedation Pre-procedure pain score:  2/10 Post-procedure pain score: 0/10 (100% relief) Influential Factors: BMI: 36.58 kg/m Intra-procedural challenges: None observed.         Assessment challenges: None detected.              Reported side-effects: None.        Post-procedural adverse reactions or complications: None reported         Sedation: Sedation provided. When no sedatives are used, the analgesic levels obtained are directly associated to the effectiveness of the local anesthetics. However, when sedation is  provided, the level of analgesia obtained during the initial 1 hour following the intervention, is believed to be the result of a combination of factors. These factors may include, but are not limited to: 1. The effectiveness of the local anesthetics used. 2. The effects of the analgesic(s) and/or anxiolytic(s) used. 3. The degree of discomfort experienced by the patient at the time of the procedure. 4. The patients ability and reliability in recalling and recording the events. 5. The presence and influence of possible secondary gains and/or psychosocial factors. Reported result: Relief experienced during the 1st hour after the procedure: 100 % (Ultra-Short Term Relief)            Interpretative annotation: Clinically appropriate result. Analgesia during this period is likely to be Local Anesthetic and/or IV Sedative (Analgesic/Anxiolytic) related.          Effects of local anesthetic: The analgesic effects attained during this period are directly associated to the localized infiltration of local anesthetics and therefore cary significant diagnostic value as to the etiological location, or anatomical origin, of the pain. Expected duration of relief is directly dependent on the pharmacodynamics of the local anesthetic used. Long-acting (4-6 hours) anesthetics used.  Reported result: Relief during the next 4 to 6 hour after the procedure: 100 % (Short-Term Relief)            Interpretative annotation: Clinically appropriate result. Analgesia during this period is likely to be Local Anesthetic-related.          Long-term benefit: Defined as the period of time past the expected duration of local anesthetics (1 hour for short-acting and 4-6 hours for long-acting). With the possible exception of prolonged sympathetic blockade from the local anesthetics, benefits during this period are typically attributed to, or associated with, other factors such as analgesic sensory neuropraxia, antiinflammatory effects, or  beneficial biochemical changes provided by agents other than the local anesthetics.  Reported result: Extended relief following procedure: 50 %(4th and 5th day were worse) (Long-Term Relief)            Interpretative annotation: Clinically appropriate result. Good relief. No permanent benefit expected. Inflammation plays a part in the etiology to the pain.          Current benefits: Defined as reported results that persistent at this point in time.   Analgesia: <25 %            Function: Back to baseline ROM: Back to baseline Interpretative annotation: Partial relief. Therapeutic benefit observed. Effective diagnostic intervention.          Interpretation: Results would suggest a successful diagnostic intervention.                  Plan:  Proceed with Radiofrequency Ablation for the purpose of attaining long-term benefits.                Laboratory Chemistry  Inflammation Markers (CRP: Acute Phase) (ESR: Chronic Phase) Lab Results  Component Value Date   CRP 7.7 (H) 01/06/2018   ESRSEDRATE 8 01/06/2018                         Renal Function Markers Lab Results  Component Value Date   BUN 13 01/06/2018   CREATININE 0.67 01/06/2018   GFRAA 111 01/06/2018   GFRNONAA 97 01/06/2018  Hepatic Function Markers Lab Results  Component Value Date   AST 17 01/06/2018   ALBUMIN 4.2 01/06/2018   ALKPHOS 132 (H) 01/06/2018                        Electrolytes Lab Results  Component Value Date   NA 146 (H) 01/06/2018   K 4.2 01/06/2018   CL 107 (H) 01/06/2018   CALCIUM 9.5 01/06/2018   MG 1.9 01/06/2018                        Neuropathy Markers Lab Results  Component Value Date   VITAMINB12 583 01/06/2018                        Bone Pathology Markers Lab Results  Component Value Date   25OHVITD1 51 01/06/2018   25OHVITD2 <1.0 01/06/2018   25OHVITD3 51 01/06/2018                         Coagulation Parameters Lab Results  Component Value  Date   PLT 330 03/20/2017                        Cardiovascular Markers Lab Results  Component Value Date   CKTOTAL 105 09/22/2012   CKMB 1.3 09/22/2012   TROPONINI <0.03 03/20/2017   HGB 13.4 03/20/2017   HCT 39.9 03/20/2017                         Note: Lab results reviewed.  Recent Diagnostic Imaging Results  DG C-Arm 1-60 Min-No Report Fluoroscopy was utilized by the requesting physician.  No radiographic  interpretation.   Complexity Note: I personally reviewed the fluoroscopic imaging of the procedure.                        Meds   Current Outpatient Medications:  .  acetaminophen (TYLENOL) 500 MG tablet, Take 500 mg by mouth every 6 (six) hours as needed. 2 tabs, Disp: , Rfl:  .  albuterol (PROVENTIL HFA;VENTOLIN HFA) 108 (90 Base) MCG/ACT inhaler, Inhale 2 puffs into the lungs every 4 (four) hours as needed for wheezing or shortness of breath., Disp: , Rfl:  .  albuterol (PROVENTIL) (2.5 MG/3ML) 0.083% nebulizer solution, Take 2.5 mg by nebulization every 6 (six) hours as needed for wheezing or shortness of breath., Disp: , Rfl:  .  Calcium Carbonate-Simethicone 750-80 MG CHEW, Chew 2 each by mouth 1 day or 1 dose., Disp: , Rfl:  .  cetirizine (ZYRTEC) 10 MG tablet, Take 10 mg by mouth daily., Disp: , Rfl:  .  Cholecalciferol (VITAMIN D3) 2000 units TABS, Take 2,000 Units by mouth daily., Disp: , Rfl:  .  diphenhydrAMINE (BENADRYL) 25 mg capsule, Take 50 mg by mouth at bedtime as needed., Disp: , Rfl:  .  etodolac (LODINE) 400 MG tablet, Take 400 mg by mouth 2 (two) times daily., Disp: , Rfl:  .  fluticasone (FLONASE) 50 MCG/ACT nasal spray, Place 2 sprays into both nostrils daily., Disp: , Rfl:  .  fluticasone (FLOVENT HFA) 110 MCG/ACT inhaler, Inhale 2 puffs into the lungs 2 (two) times daily., Disp: , Rfl:  .  HYDROcodone-acetaminophen (NORCO/VICODIN) 5-325 MG tablet, Take 1 tablet by mouth daily as needed for severe pain., Disp: 30 tablet, Rfl: 0 .  Magnesium Oxide  500 MG CAPS, Take 1 capsule (500 mg total) by mouth 2 (two) times daily at 8 am and 10 pm., Disp: 60 capsule, Rfl: 2 .  montelukast (SINGULAIR) 10 MG tablet, Take 10 mg by mouth at bedtime., Disp: , Rfl:  .  Multiple Vitamins-Minerals (MULTIVITAMIN WITH MINERALS) tablet, Take 1 tablet by mouth daily., Disp: , Rfl:  .  omeprazole (PRILOSEC) 40 MG capsule, Take 40 mg by mouth daily., Disp: , Rfl:  .  Probiotic Product (PROBIOTIC-10 PO), Take by mouth daily., Disp: , Rfl:  .  sertraline (ZOLOFT) 100 MG tablet, Take 100 mg by mouth daily., Disp: , Rfl:   ROS  Constitutional: Denies any fever or chills Gastrointestinal: No reported hemesis, hematochezia, vomiting, or acute GI distress Musculoskeletal: Denies any acute onset joint swelling, redness, loss of ROM, or weakness Neurological: No reported episodes of acute onset apraxia, aphasia, dysarthria, agnosia, amnesia, paralysis, loss of coordination, or loss of consciousness  Allergies  Ms. Art is allergic to erythromycin and sulfa antibiotics.  PFSH  Drug: Ms. Cottone  has no drug history on file. Alcohol:  reports that she does not drink alcohol. Tobacco:  reports that she has been smoking.  She started smoking about 48 years ago. She has a 129.00 pack-year smoking history. She has never used smokeless tobacco. Medical:  has a past medical history of Anxiety, Arthritis, Asthma, COPD (chronic obstructive pulmonary disease) ([place]), GERD (gastroesophageal reflux disease), and Hypertension. Surgical: Ms. Brosious  has a past surgical history that includes Rotator cuff repair (Right); Abdominal hysterectomy; Hernia repair; Foot surgery (Left); and carpel tunnel (Bilateral). Family: family history includes Breast cancer in her paternal aunt; Breast cancer (age of onset: 15) in her mother.  Constitutional Exam  General appearance: Well nourished, well developed, and well hydrated. In no apparent acute distress Vitals:   03/21/18 1336  BP: 137/89  Pulse:  72  Resp: 18  Temp: 98.4 F (36.9 C)  TempSrc: Oral  SpO2: 98%  Weight: 200 lb (90.7 kg)  Height: '5\' 2"'  (1.575 m)   BMI Assessment: Estimated body mass index is 36.58 kg/m as calculated from the following:   Height as of this encounter: '5\' 2"'  (1.575 m).   Weight as of this encounter: 200 lb (90.7 kg).  BMI interpretation table: BMI level Category Range association with higher incidence of chronic pain  <18 kg/m2 Underweight   18.5-24.9 kg/m2 Ideal body weight   25-29.9 kg/m2 Overweight Increased incidence by 20%  30-34.9 kg/m2 Obese (Class I) Increased incidence by 68%  35-39.9 kg/m2 Severe obesity (Class II) Increased incidence by 136%  >40 kg/m2 Extreme obesity (Class III) Increased incidence by 254%   BMI Readings from Last 4 Encounters:  03/21/18 36.58 kg/m  02/24/18 36.95 kg/m  02/16/18 36.76 kg/m  12/28/17 37.13 kg/m   Wt Readings from Last 4 Encounters:  03/21/18 200 lb (90.7 kg)  02/24/18 202 lb (91.6 kg)  02/16/18 201 lb (91.2 kg)  12/28/17 203 lb (92.1 kg)  Psych/Mental status: Alert, oriented x 3 (person, place, & time)       Eyes: PERLA Respiratory: No evidence of acute respiratory distress  Cervical Spine Area Exam  Skin & Axial Inspection: No masses, redness, edema, swelling, or associated skin lesions Alignment: Symmetrical Functional ROM: Unrestricted ROM      Stability: No instability detected Muscle Tone/Strength: Functionally intact. No obvious neuro-muscular anomalies detected. Sensory (Neurological): Unimpaired Palpation: No palpable anomalies  Upper Extremity (UE) Exam    Side: Right upper extremity  Side: Left upper extremity  Skin & Extremity Inspection: Skin color, temperature, and hair growth are WNL. No peripheral edema or cyanosis. No masses, redness, swelling, asymmetry, or associated skin lesions. No contractures.  Skin & Extremity Inspection: Skin color, temperature, and hair growth are WNL. No peripheral edema or  cyanosis. No masses, redness, swelling, asymmetry, or associated skin lesions. No contractures.  Functional ROM: Unrestricted ROM          Functional ROM: Unrestricted ROM          Muscle Tone/Strength: Functionally intact. No obvious neuro-muscular anomalies detected.  Muscle Tone/Strength: Functionally intact. No obvious neuro-muscular anomalies detected.  Sensory (Neurological): Unimpaired          Sensory (Neurological): Unimpaired          Palpation: No palpable anomalies              Palpation: No palpable anomalies              Specialized Test(s): Deferred         Specialized Test(s): Deferred          Thoracic Spine Area Exam  Skin & Axial Inspection: No masses, redness, or swelling Alignment: Symmetrical Functional ROM: Unrestricted ROM Stability: No instability detected Muscle Tone/Strength: Functionally intact. No obvious neuro-muscular anomalies detected. Sensory (Neurological): Unimpaired Muscle strength & Tone: No palpable anomalies  Lumbar Spine Area Exam  Skin & Axial Inspection: No masses, redness, or swelling Alignment: Symmetrical Functional ROM: Unrestricted ROM       Stability: No instability detected Muscle Tone/Strength: Functionally intact. No obvious neuro-muscular anomalies detected. Sensory (Neurological): Unimpaired Palpation: No palpable anomalies       Provocative Tests: Lumbar Hyperextension and rotation test: evaluation deferred today       Lumbar Lateral bending test: evaluation deferred today       Patrick's Maneuver: evaluation deferred today                    Gait & Posture Assessment  Ambulation: Patient ambulates using a walker Gait: Antalgic Posture: Difficulty standing up straight, due to pain   Lower Extremity Exam    Side: Right lower extremity  Side: Left lower extremity  Skin & Extremity Inspection: Skin color, temperature, and hair growth are WNL. No peripheral edema or cyanosis. No masses, redness, swelling, asymmetry, or associated  skin lesions. No contractures.  Skin & Extremity Inspection: Skin color, temperature, and hair growth are WNL. No peripheral edema or cyanosis. No masses, redness, swelling, asymmetry, or associated skin lesions. No contractures.  Functional ROM: Unrestricted ROM          Functional ROM: Unrestricted ROM          Muscle Tone/Strength: Functionally intact. No obvious neuro-muscular anomalies detected.  Muscle Tone/Strength: Functionally intact. No obvious neuro-muscular anomalies detected.  Sensory (Neurological): Unimpaired  Sensory (Neurological): Unimpaired  Palpation: No palpable anomalies  Palpation: No palpable anomalies   Assessment  Primary Diagnosis & Pertinent Problem List: The primary encounter diagnosis was Chronic low back pain (Primary Area of Pain) (Bilateral) (R>L). Diagnoses of Coccygodynia, DDD (degenerative disc disease), lumbar, Chronic ankle pain (Secondary Area of Pain) (Bilateral) (R>L), Chronic knee pain (Tertiary Area of Pain) (Bilateral) (L>R), Spondylosis without myelopathy or radiculopathy, lumbosacral region, Lumbar facet syndrome (Bilateral) (R>L), Lumbar facet arthropathy, and Sacrococcygeal pain were also pertinent to this visit.  Status Diagnosis  Controlled Recurring Controlled 1. Chronic low back pain (  Primary Area of Pain) (Bilateral) (R>L)   2. Coccygodynia   3. DDD (degenerative disc disease), lumbar   4. Chronic ankle pain (Secondary Area of Pain) (Bilateral) (R>L)   5. Chronic knee pain (Tertiary Area of Pain) (Bilateral) (L>R)   6. Spondylosis without myelopathy or radiculopathy, lumbosacral region   7. Lumbar facet syndrome (Bilateral) (R>L)   8. Lumbar facet arthropathy   9. Sacrococcygeal pain     Problems updated and reviewed during this visit: No problems updated. Plan of Care  Pharmacotherapy (Medications Ordered): No orders of the defined types were placed in this encounter.  Medications administered today: Aubriee L. Baccam had no  medications administered during this visit.   Procedure Orders     RADIOFREQUENCY, SACROCOCCYGEAL Lab Orders  No laboratory test(s) ordered today   Imaging Orders  No imaging studies ordered today   Referral Orders  No referral(s) requested today    Interventional management options: Planned, scheduled, and/or pending:   1. Therapeutic (Midline) sacrococcygeal nerve RFA #1 under fluoroscopic guidance and IV sedation 2. Diagnostic bilateral lumbar facet block #1 under fluoroscopic guidance and IV sedation, at a later time.   Considering:   Diagnostic bilateral lumbar facet nerve block Possible bilateral lumbar facet RFA  Diagnostic bilateral sacroiliac joint block  Possible bilateral sacroiliac joint RFA  Diagnosticbilateral intra-articular knee injection  Diagnostic bilateral Hyalgan series  Diagnostic bilateral genicular Nerve block  Possible bilateral genicular RFA  Diagnostic bilateral ankle injections    Palliative PRN treatment(s):   None at this time   Provider-requested follow-up: Return for RFA (fluoro + sedation): (ML) Sacroccoxygeal RFA.  Future Appointments  Date Time Provider [facility]  05/05/2018  2:15 PM Milinda Pointer, MD [facility] None   Primary Care Physician: [facility] [place] Location: [facility] Outpatient Pain Management Facility Note by: Gaspar Cola, MD Date: 03/21/2018; Time: 3:25 PM

## 2018-03-21 NOTE — Patient Instructions (Signed)
____________________________________________________________________________________________  Preparing for Procedure with Sedation  Instructions: . Oral Intake: Do not eat or drink anything for at least 8 hours prior to your procedure. . Transportation: Public transportation is not allowed. Bring an adult driver. The driver must be physically present in our waiting room before any procedure can be started. . Physical Assistance: Bring an adult physically capable of assisting you, in the event you need help. This adult should keep you company at home for at least 6 hours after the procedure. . Blood Pressure Medicine: Take your blood pressure medicine with a sip of water the morning of the procedure. . Blood thinners:  . Diabetics on insulin: Notify the staff so that you can be scheduled 1st case in the morning. If your diabetes requires high dose insulin, take only  of your normal insulin dose the morning of the procedure and notify the staff that you have done so. . Preventing infections: Shower with an antibacterial soap the morning of your procedure. . Build-up your immune system: Take 1000 mg of Vitamin C with every meal (3 times a day) the day prior to your procedure. . Antibiotics: Inform the staff if you have a condition or reason that requires you to take antibiotics before dental procedures. . Pregnancy: If you are pregnant, call and cancel the procedure. . Sickness: If you have a cold, fever, or any active infections, call and cancel the procedure. . Arrival: You must be in the facility at least 30 minutes prior to your scheduled procedure. . Children: Do not bring children with you. . Dress appropriately: Bring dark clothing that you would not mind if they get stained. . Valuables: Do not bring any jewelry or valuables.  Procedure appointments are reserved for interventional treatments only. . No Prescription Refills. . No medication changes will be discussed during procedure  appointments. . No disability issues will be discussed.  Remember:  Regular Business hours are:  Monday to Thursday 8:00 AM to 4:00 PM  Provider's Schedule: Wajiha Versteeg, MD:  Procedure days: Tuesday and Thursday 7:30 AM to 4:00 PM  Bilal Lateef, MD:  Procedure days: Monday and Wednesday 7:30 AM to 4:00 PM ____________________________________________________________________________________________    

## 2018-04-04 ENCOUNTER — Other Ambulatory Visit: Payer: Self-pay | Admitting: Family Medicine

## 2018-04-04 DIAGNOSIS — Z1231 Encounter for screening mammogram for malignant neoplasm of breast: Secondary | ICD-10-CM

## 2018-04-14 ENCOUNTER — Encounter: Payer: Self-pay | Admitting: Internal Medicine

## 2018-04-14 ENCOUNTER — Ambulatory Visit (INDEPENDENT_AMBULATORY_CARE_PROVIDER_SITE_OTHER): Payer: 59 | Admitting: Internal Medicine

## 2018-04-14 VITALS — BP 126/78 | HR 82 | Resp 16 | Ht 62.0 in | Wt 201.0 lb

## 2018-04-14 DIAGNOSIS — J449 Chronic obstructive pulmonary disease, unspecified: Secondary | ICD-10-CM

## 2018-04-14 DIAGNOSIS — F17210 Nicotine dependence, cigarettes, uncomplicated: Secondary | ICD-10-CM | POA: Diagnosis not present

## 2018-04-14 DIAGNOSIS — R0602 Shortness of breath: Secondary | ICD-10-CM

## 2018-04-14 NOTE — Patient Instructions (Addendum)
CUT BACK ON  SMOKING!!! Continue with inhalers as prescribed

## 2018-04-14 NOTE — Progress Notes (Signed)
Name: Sandra Strong MRN: [id number] DOB: 09/21/1956     CONSULTATION DATE: 5.16.19 REFERRING MD : Graciella Belton  CHIEF COMPLAINT: SOB  STUDIES:         1.29.19 CT chest Independently reviewed by Me No acute findings No masses, pneumonia No effusions 3.7 mm in the left lower lobe.   HISTORY OF PRESENT ILLNESS:  62 yo white female with SOB Chronic SOB and DOE for many years +intermittent wheezing and cough for many years has had childhood ASTHMA and allergies Has extensive smoking history 128 pack year   Patient states that she is exposed to plastic and humidity and hot air with work as she works in [employer]il [employer] Patient states that she has had tried advair and Spiriva in the past and that they have increased her wheezing  She is trying to cut down smoking bu she is not willing to stop at this time  She has chronic allergic rhinitis and takes daily flonase and zyrtec and benadryl  No signs of infection at this time No signs of heart failure at this time No signs of acute COPD or asthma exacerbation at this time  Patient takes Flovent which seems to be helping her symptoms She takes her nebulized therapy approximately 3 times per week Patient states she has had pneumonia 3 times in the past 2007 2010 and 2018   Smoking Assessment and Cessation Counseling   Upon further questioning, Patient smokes 1/2 ppd  I have advised patient to quit/stop smoking as soon as possible due to high risk for multiple medical problems  Patient s NOT willing to quit smoking at this time  I have advised patient that we can assist and have options of Nicotine replacement therapy. I also advised patient on behavioral therapy and can provide oral medication therapy in conjunction with the other therapies  Follow up next Office visit  for assessment of smoking cessation  Smoking cessation counseling advised for 4 minutes    PAST MEDICAL HISTORY :   has a past medical history of Anxiety,  Arthritis, Asthma, COPD (chronic obstructive pulmonary disease) (HCC), GERD (gastroesophageal reflux disease), and Hypertension.  has a past surgical history that includes Rotator cuff repair (Right); Abdominal hysterectomy; Hernia repair; Foot surgery (Left); and carpel tunnel (Bilateral). Prior to Admission medications   Medication Sig Start Date End Date Taking? Authorizing Provider  acetaminophen (TYLENOL) 500 MG tablet Take 500 mg by mouth every 6 (six) hours as needed. 2 tabs    [provider]  albuterol (PROVENTIL HFA;VENTOLIN HFA) 108 (90 Base) MCG/ACT inhaler Inhale 2 puffs into the lungs every 4 (four) hours as needed for wheezing or shortness of breath.    [provider]  albuterol (PROVENTIL) (2.5 MG/3ML) 0.083% nebulizer solution Take 2.5 mg by nebulization every 6 (six) hours as needed for wheezing or shortness of breath.    [provider]  Calcium Carbonate-Simethicone 750-80 MG CHEW Chew 2 each by mouth 1 day or 1 dose.    [provider]  cetirizine (ZYRTEC) 10 MG tablet Take 10 mg by mouth daily.    [provider]  Cholecalciferol (VITAMIN D3) 2000 units TABS Take 2,000 Units by mouth daily.    [provider]  diphenhydrAMINE (BENADRYL) 25 mg capsule Take 50 mg by mouth at bedtime as needed.    [provider]  etodolac (LODINE) 400 MG tablet Take 400 mg by mouth 2 (two) times daily.    [provider]  fluticasone (FLONASE) 50  MCG/ACT nasal spray Place 2 sprays into both nostrils daily.    [provider]  fluticasone (FLOVENT HFA) 110 MCG/ACT inhaler Inhale 2 puffs into the lungs 2 (two) times daily.    [provider]  HYDROcodone-acetaminophen (NORCO/VICODIN) 5-325 MG tablet Take 1 tablet by mouth daily as needed for severe pain. 02/16/18 03/21/18  Delano Metz, MD  Magnesium Oxide 500 MG CAPS Take 1 capsule (500 mg total) by mouth 2 (two) times daily at 8 am and 10 pm. 02/16/18  05/17/18  Delano Metz, MD  montelukast (SINGULAIR) 10 MG tablet Take 10 mg by mouth at bedtime.    [provider]  Multiple Vitamins-Minerals (MULTIVITAMIN WITH MINERALS) tablet Take 1 tablet by mouth daily.    [provider]  omeprazole (PRILOSEC) 40 MG capsule Take 40 mg by mouth daily.    [provider]  Probiotic Product (PROBIOTIC-10 PO) Take by mouth daily.    [provider]  sertraline (ZOLOFT) 100 MG tablet Take 100 mg by mouth daily.    [provider]   Allergies  Allergen Reactions  . Erythromycin   . Sulfa Antibiotics     FAMILY HISTORY:  family history includes Breast cancer in her paternal aunt; Breast cancer (age of onset: 38) in her mother. SOCIAL HISTORY:  reports that she has been smoking.  She started smoking about 48 years ago. She has a 129.00 pack-year smoking history. She has never used smokeless tobacco. She reports that she does not drink alcohol.  REVIEW OF SYSTEMS:   Constitutional: Negative for fever, chills, weight loss, malaise/fatigue and diaphoresis.  HENT: Negative for hearing loss, ear pain, nosebleeds, congestion, sore throat, neck pain, tinnitus and ear discharge.   Eyes: Negative for blurred vision, double vision, photophobia, pain, discharge and redness.  Respiratory: intermittent shortness of breath, wheezing  Cardiovascular: Negative for chest pain, palpitations, orthopnea, claudication, leg swelling and PND.  Gastrointestinal: Negative for heartburn, nausea, vomiting, abdominal pain, diarrhea, constipation, blood in stool and melena.  Genitourinary: Negative for dysuria, urgency, frequency, hematuria and flank pain.  Musculoskeletal: +back pain osteoperosis Skin: Negative for itching and rash.  Neurological: Negative for dizziness, tingling, tremors, sensory change, speech change, focal weakness, seizures, loss of consciousness, weakness and headaches.  Endo/Heme/Allergies: Negative for  environmental allergies and polydipsia. Does not bruise/bleed easily.  ALL OTHER ROS ARE NEGATIVE   BP 126/78 (BP Location: Left Arm, Cuff Size: Large)   Pulse 82   Resp 16   Ht  (1.575 m)   Wt 201 lb (91.2 kg)   SpO2 96%   BMI 36.76 kg/m   Physical Examination:   GENERAL:NAD, no fevers, chills, no weakness no fatigue HEAD: Normocephalic, atraumatic.  EYES: Pupils equal, round, reactive to light. Extraocular muscles intact. No scleral icterus.  MOUTH: Moist mucosal membrane.   EAR, NOSE, THROAT: Clear without exudates. No external lesions.  NECK: Supple. No thyromegaly. No nodules. No JVD.  PULMONARY:CTA B/L no wheezes, no crackles, no rhonchi CARDIOVASCULAR: S1 and S2. Regular rate and rhythm. No murmurs, rubs, or gallops. No edema.  GASTROINTESTINAL: Soft, nontender, nondistended. No masses. Positive bowel sounds.  MUSCULOSKELETAL: No swelling, clubbing, or edema. Range of motion full in all extremities.  NEUROLOGIC: Cranial nerves II through XII are intact. No gross focal neurological deficits.  SKIN: No ulceration, lesions, rashes, or cyanosis. Skin warm and dry. Turgor intact.  PSYCHIATRIC: Mood, affect within normal limits. The patient is awake, alert and oriented x 3. Insight, judgment intact.  ASSESSMENT / PLAN: 62 year old pleasant white female seen today for long-standing smoking history in the setting of chronic allergic rhinitis with history of asthma and COPD with ongoing tobacco abuse  At this time I have spent significant amount of time with patient regarding smoking cessation and the risk factors associated with tobacco abuse  At this time she is not willing to quit,  best she can do is try to cut down  Her office spirometry shows FEV1 of 1.7 with 72% predicted with a ratio 79% predicted with a reduced FEF 2575 1.75 L at 78% predicted this is a mild form of COPD  1 mild COPD continue albuterol as needed 2.Allergic rhinitis continue Zyrtec Flonase  Benadryl and Singulair as prescribed  3.Smoking cessation strongly advised 4.  3.7 mm nodule in the left lower lobe follow-up CT scan in 1 year   Patient satisfied with Plan of action and management. All questions answered Follow up in 6 months  Tesha Archambeau Santiago Glad, M.D.  [facility]  Medical Director [facility] Medical Director [facility] Cardio-Pulmonary Department

## 2018-04-19 ENCOUNTER — Encounter: Payer: Self-pay | Admitting: Podiatry

## 2018-04-19 ENCOUNTER — Ambulatory Visit (INDEPENDENT_AMBULATORY_CARE_PROVIDER_SITE_OTHER): Payer: 59 | Admitting: Podiatry

## 2018-04-19 DIAGNOSIS — M659 Synovitis and tenosynovitis, unspecified: Secondary | ICD-10-CM | POA: Diagnosis not present

## 2018-04-19 DIAGNOSIS — M722 Plantar fascial fibromatosis: Secondary | ICD-10-CM

## 2018-04-20 IMAGING — CR DG CHEST 2V
2 series · 2 of 2 positions shown · non-contrast
Comparison: Chest x-rays dated 09/12/2014 and 09/22/2012.

CLINICAL DATA: reports SOB x 2 weeks, hx copd/asthma, has been
using inhaler and nebulizer at home w/o relief. Pt reports she just
finished course of prednisone w/o improvement.Hx of HTN. Smoker.

EXAM:
CHEST  2 VIEW

[chest pa]
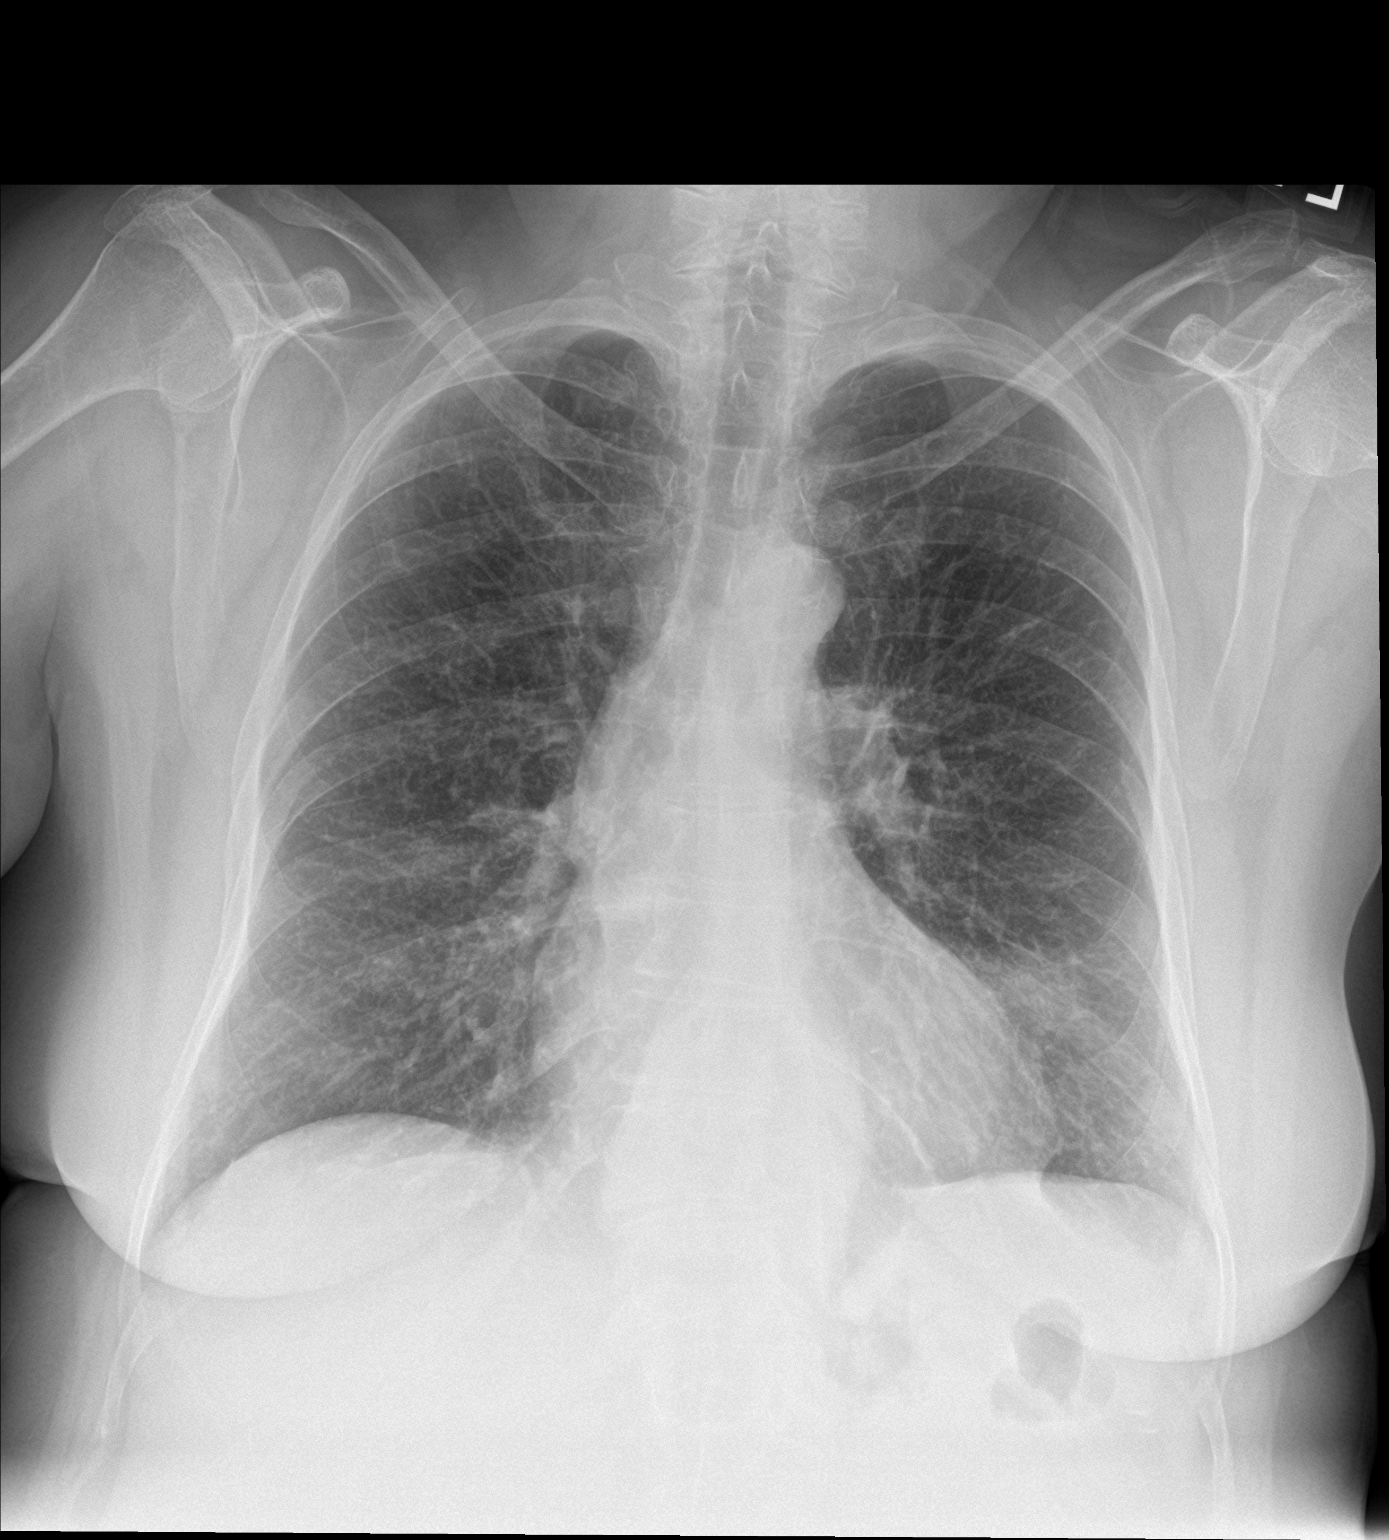

[chest lat]
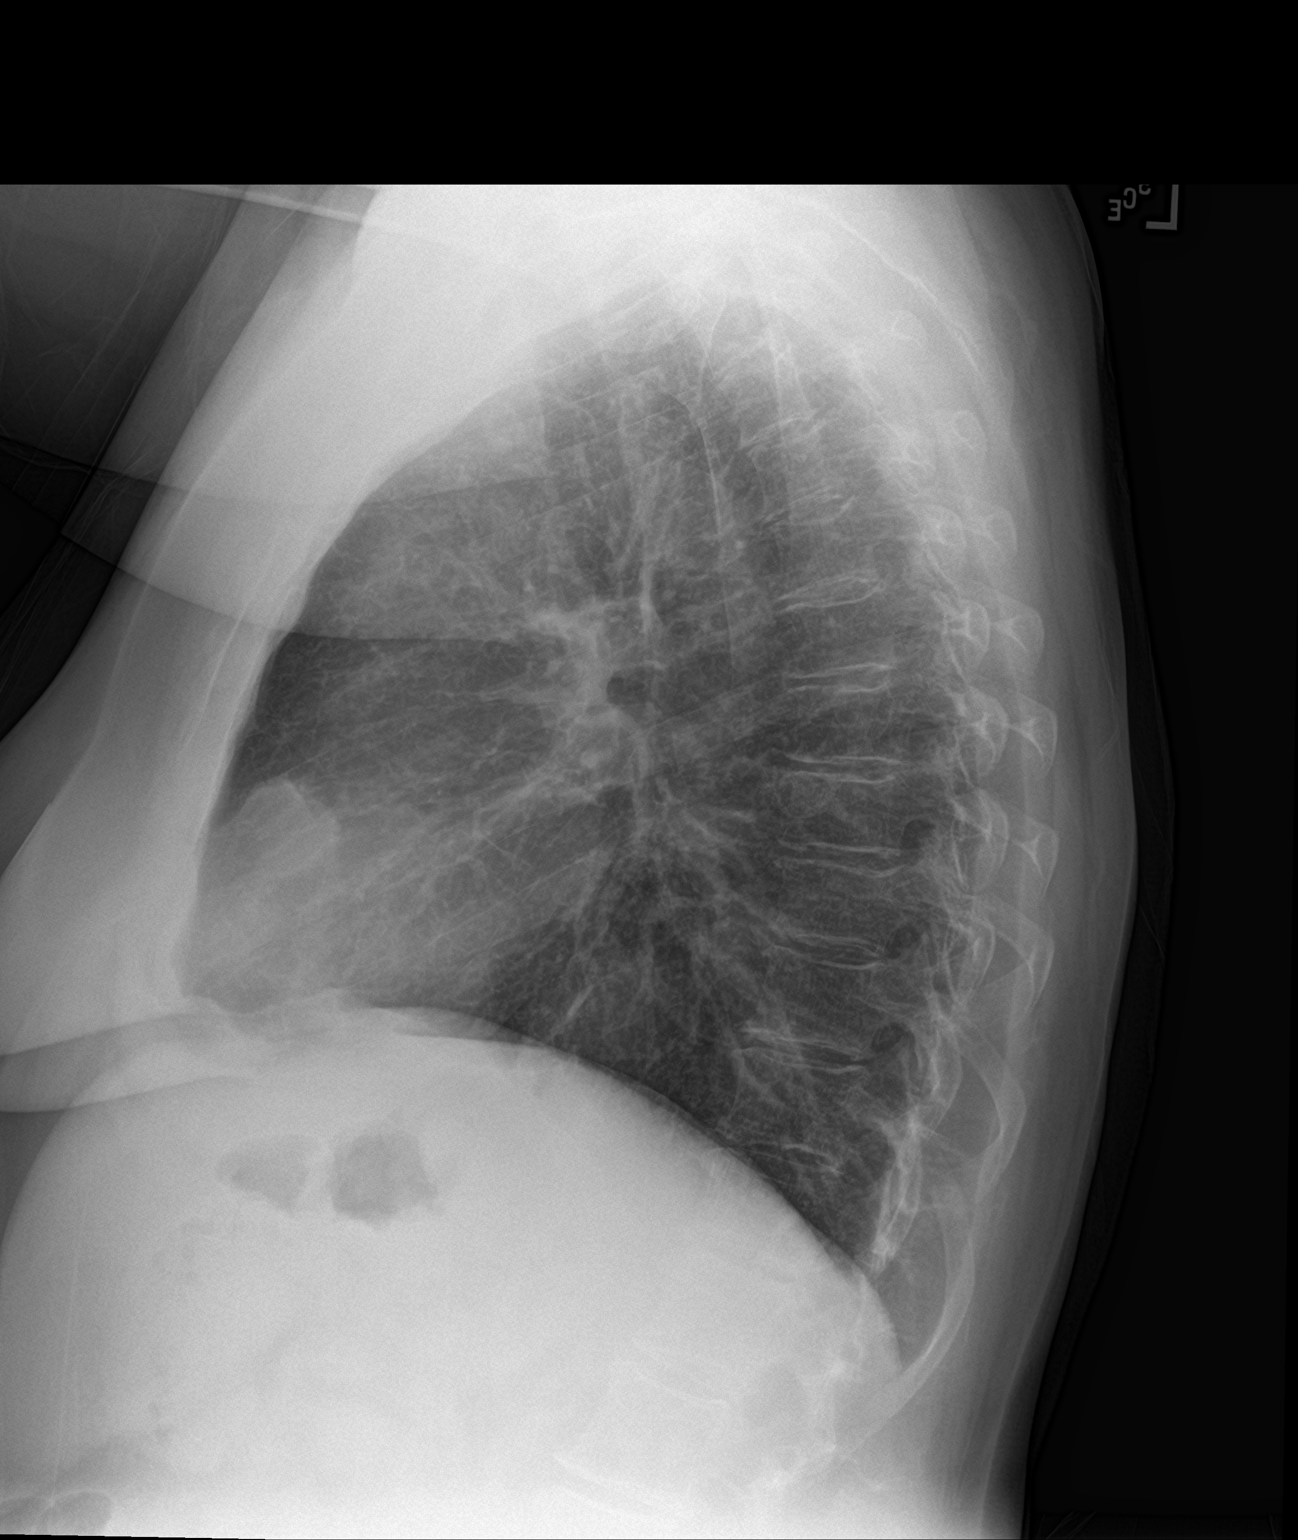

[2 of 2 positions shown; findings below may reference images not displayed]

FINDINGS: Heart size and mediastinal contours are stable. Atherosclerotic
changes noted at the aortic arch.

Lungs are at least mildly hyperexpanded suggesting COPD. Coarse
interstitial markings bilaterally suggests some degree of chronic
interstitial lung disease. Confluent opacity is seen at the left
lung base, presumably within the lingula, compatible with confluent
edema or pneumonia. No pleural effusion or pneumothorax seen.

Mild degenerative spurring noted within the thoracic spine. No acute
or suspicious osseous finding.
IMPRESSION: 1. New opacity at the left lung base, likely within the lingula,
compatible with edema or pneumonia, favor pneumonia. Recommend
follow-up chest x-ray in 2-4 weeks to ensure resolution.
2. Probable COPD and chronic interstitial lung disease.
3. Aortic atherosclerosis.

## 2018-04-21 NOTE — Progress Notes (Signed)
   Subjective: 62 year old female presenting today as a new patient with a chief complaint of intermittent aching, sharp pain of bilateral feet and ankles that began 1-2 years ago. She reports associated swelling of the left foot and ankle. She is currently in pain management and states Salonpas helps alleviate the pain. Walking increases the symptoms. Patient is here for further evaluation and treatment.   Past Medical History:  Diagnosis Date  . Anxiety   . Arthritis   . Asthma   . COPD (chronic obstructive pulmonary disease) (HCC)   . GERD (gastroesophageal reflux disease)   . Hypertension      Objective: Physical Exam General: The patient is alert and oriented x3 in no acute distress.  Dermatology: Skin is warm, dry and supple bilateral lower extremities. Negative for open lesions or macerations bilateral.   Vascular: Dorsalis Pedis and Posterior Tibial pulses palpable bilateral.  Capillary fill time is immediate to all digits.  Neurological: Epicritic and protective threshold intact bilateral.   Musculoskeletal: Pain on palpation to the anterior lateral medial aspects of the patient's right ankle. Mild edema noted. Tenderness to palpation to the plantar aspect of the bilateral heels along the plantar fascia. All other joints range of motion within normal limits bilateral. Strength 5/5 in all groups bilateral.    Assessment: 1. plantar fasciitis bilateral feet 2. Right ankle synovitis   Plan of Care:  1. Patient evaluated.  2. Appointment with [facility] for custom molded orthotics.  3. Continue management of pain with pain clinic.  4. Return to clinic as needed.   Goes by [employer].     Felecia Shelling, DPM [facility]  Dr. Felecia Shelling, DPM    [address] [place], [place] [address]                Office [phone]  Fax [phone]

## 2018-05-05 ENCOUNTER — Ambulatory Visit (HOSPITAL_BASED_OUTPATIENT_CLINIC_OR_DEPARTMENT_OTHER): Payer: 59 | Admitting: Pain Medicine

## 2018-05-05 ENCOUNTER — Encounter: Payer: Self-pay | Admitting: Pain Medicine

## 2018-05-05 ENCOUNTER — Other Ambulatory Visit: Payer: Self-pay

## 2018-05-05 ENCOUNTER — Ambulatory Visit
Admission: RE | Admit: 2018-05-05 | Discharge: 2018-05-05 | Disposition: A | Discharge status: Home / Self Care | Payer: 59 | Source: Ambulatory Visit | Attending: Pain Medicine | Admitting: Pain Medicine

## 2018-05-05 VITALS — BP 141/93 | HR 86 | Temp 97.6°F | Resp 15 | Ht 62.0 in | Wt 200.0 lb

## 2018-05-05 DIAGNOSIS — M545 Low back pain, unspecified: Secondary | ICD-10-CM

## 2018-05-05 DIAGNOSIS — G8918 Other acute postprocedural pain: Secondary | ICD-10-CM | POA: Insufficient documentation

## 2018-05-05 DIAGNOSIS — M47818 Spondylosis without myelopathy or radiculopathy, sacral and sacrococcygeal region: Secondary | ICD-10-CM

## 2018-05-05 DIAGNOSIS — G8929 Other chronic pain: Secondary | ICD-10-CM | POA: Diagnosis not present

## 2018-05-05 DIAGNOSIS — M5388 Other specified dorsopathies, sacral and sacrococcygeal region: Secondary | ICD-10-CM

## 2018-05-05 DIAGNOSIS — M533 Sacrococcygeal disorders, not elsewhere classified: Secondary | ICD-10-CM | POA: Diagnosis not present

## 2018-05-05 MED ORDER — FENTANYL CITRATE (PF) 100 MCG/2ML IJ SOLN
25.0000 ug | INTRAMUSCULAR | Status: DC | PRN
  Administered 2018-05-05: 50 ug via INTRAVENOUS
  Filled 2018-05-05: qty 2

## 2018-05-05 MED ORDER — LIDOCAINE HCL 2 % IJ SOLN
INTRAMUSCULAR | Status: AC
  Filled 2018-05-05: qty 20

## 2018-05-05 MED ORDER — LIDOCAINE HCL 2 % IJ SOLN
20.0000 mL | Freq: Once | INTRAMUSCULAR | Status: AC
  Administered 2018-05-05: 400 mg

## 2018-05-05 MED ORDER — TRIAMCINOLONE ACETONIDE 40 MG/ML IJ SUSP
40.0000 mg | Freq: Once | INTRAMUSCULAR | Status: AC
  Administered 2018-05-05: 40 mg
  Filled 2018-05-05: qty 1

## 2018-05-05 MED ORDER — ROPIVACAINE HCL 2 MG/ML IJ SOLN
3.0000 mL | Freq: Once | INTRAMUSCULAR | Status: AC
  Administered 2018-05-05: 10 mL via PERINEURAL
  Filled 2018-05-05: qty 10

## 2018-05-05 MED ORDER — LACTATED RINGERS IV SOLN
1000.0000 mL | Freq: Once | INTRAVENOUS | Status: AC
  Administered 2018-05-05: 1000 mL via INTRAVENOUS

## 2018-05-05 MED ORDER — OXYCODONE-ACETAMINOPHEN 5-325 MG PO TABS: 1.0000 | ORAL_TABLET | Freq: Four times a day (QID) | ORAL | 0 refills | Status: DC | PRN

## 2018-05-05 MED ORDER — MIDAZOLAM HCL 5 MG/5ML IJ SOLN
1.0000 mg | INTRAMUSCULAR | Status: DC | PRN
  Administered 2018-05-05: 2 mg via INTRAVENOUS
  Filled 2018-05-05: qty 5

## 2018-05-05 NOTE — Patient Instructions (Signed)

## 2018-05-05 NOTE — Progress Notes (Signed)
Patient's Name: Sandra Strong  MRN: [id number]  Referring Provider: [facility]  DOB: [birth year]  [facility]P: [facility]  DOS: 05/05/2018  Note by: Oswaldo Done, MD  Service setting: Ambulatory outpatient  Specialty: Interventional Pain Management  Patient type: Established  Location: [facility] (AMB) Pain Management Facility  Visit type: Interventional Procedure   Primary Reason for Visit: Interventional Pain Management Treatment. CC: Bone Pain (base of tail bone)  Procedure:  Anesthesia, Analgesia, Anxiolysis:  Type: Therapeutic Sacrococcygeal Nerve Radiofrequency Ablation (CPT [phone]) Region: Posterior sacral Level: Sacral Hiatus Level Laterality: Midline  Type: Moderate (Conscious) Sedation combined with Local Anesthesia Indication(s): Analgesia and Anxiety Route: Intravenous (IV) IV Access: Secured Sedation: Meaningful verbal contact was maintained at all times during the procedure  Local Anesthetic: Lidocaine 1-2%   Indications: 1. Spondylosis without myelopathy or radiculopathy, sacral and sacrococcygeal region   2. Other specified dorsopathies, sacral and sacrococcygeal region   3. Coccygodynia   4. Chronic low back pain (Primary Area of Pain) (Bilateral) (R>L)    Sandra Strong has been dealing with the above chronic pain for longer than three months and has either failed to respond, was unable to tolerate, or simply did not get enough benefit from other more conservative therapies including, but not limited to: 1. Over-the-counter medications 2. Anti-inflammatory medications 3. Muscle relaxants 4. Membrane stabilizers 5. Opioids 6. Modalities (Heat, ice, etc.) 7. Invasive techniques such as nerve blocks. Sandra Strong has attained more than 50% relief of the pain from a series of diagnostic injections conducted in separate occasions.  Pain Score: Pre-procedure: 1 /10 Post-procedure: 0-No pain/10  Pre-op Assessment:  Sandra Strong is a 62 y.o. (year  old), female patient, seen today for interventional treatment. She  has a past surgical history that includes Rotator cuff repair (Right); Abdominal hysterectomy; Hernia repair; Foot surgery (Left); and carpel tunnel (Bilateral). Sandra Strong has a current medication list which includes the following prescription(s): acetaminophen, albuterol, albuterol, alendronate, calcium carbonate-simethicone, cetirizine, vitamin d3, diphenhydramine, etodolac, fluticasone, fluticasone, magnesium oxide, montelukast, multivitamin with minerals, omeprazole, probiotic product, sertraline, vitamin c, hydrocodone-acetaminophen, and oxycodone-acetaminophen, and the following Facility-Administered Medications: fentanyl and midazolam. Her primarily concern today is the Bone Pain (base of tail bone)  Initial Vital Signs:  Pulse/HCG Rate: 86ECG Heart Rate: 86 Temp: 97.8 F (36.6 C) Resp: 16 BP: 137/82 SpO2: 98 %  BMI: Estimated body mass index is 36.58 kg/m as calculated from the following:   Height as of this encounter: 5\' 2"  (1.575 m).   Weight as of this encounter: 200 lb (90.7 kg).  Risk Assessment: Allergies: Reviewed. She is allergic to spiriva handihaler [tiotropium bromide monohydrate]; advair diskus [fluticasone-salmeterol]; erythromycin; and sulfa antibiotics.  Allergy Precautions: None required Coagulopathies: Reviewed. None identified.  Blood-thinner therapy: None at this time Active Infection(s): Reviewed. None identified. Sandra Strong is afebrile  Site Confirmation: Sandra Strong was asked to confirm the procedure and laterality before marking the site Procedure checklist: Completed Consent: Before the procedure and under the influence of no sedative(s), amnesic(s), or anxiolytics, the patient was informed of the treatment options, risks and possible complications. To fulfill our ethical and legal obligations, as recommended by the American Medical Association's Code of Ethics, I have informed the patient of my  clinical impression; the nature and purpose of the treatment or procedure; the risks, benefits, and possible complications of the intervention; the alternatives, including doing nothing; the risk(s) and benefit(s) of the alternative treatment(s) or procedure(s); and the risk(s) and benefit(s) of  doing nothing. The patient was provided information about the general risks and possible complications associated with the procedure. These may include, but are not limited to: failure to achieve desired goals, infection, bleeding, organ or nerve damage, allergic reactions, paralysis, and death. In addition, the patient was informed of those risks and complications associated to Spine-related procedures, such as failure to decrease pain; infection (i.e.: Meningitis, epidural or intraspinal abscess); bleeding (i.e.: epidural hematoma, subarachnoid hemorrhage, or any other type of intraspinal or peri-dural bleeding); organ or nerve damage (i.e.: Any type of peripheral nerve, nerve root, or spinal cord injury) with subsequent damage to sensory, motor, and/or autonomic systems, resulting in permanent pain, numbness, and/or weakness of one or several areas of the body; allergic reactions; (i.e.: anaphylactic reaction); and/or death. Furthermore, the patient was informed of those risks and complications associated with the medications. These include, but are not limited to: allergic reactions (i.e.: anaphylactic or anaphylactoid reaction(s)); adrenal axis suppression; blood sugar elevation that in diabetics may result in ketoacidosis or comma; water retention that in patients with history of congestive heart failure may result in shortness of breath, pulmonary edema, and decompensation with resultant heart failure; weight gain; swelling or edema; medication-induced neural toxicity; particulate matter embolism and blood vessel occlusion with resultant organ, and/or nervous system infarction; and/or aseptic necrosis of one or  more joints. Finally, the patient was informed that Medicine is not an exact science; therefore, there is also the possibility of unforeseen or unpredictable risks and/or possible complications that may result in a catastrophic outcome. The patient indicated having understood very clearly. We have given the patient no guarantees and we have made no promises. Enough time was given to the patient to ask questions, all of which were answered to the patient's satisfaction. Sandra Strong has indicated that she wanted to continue with the procedure. Attestation: I, the ordering provider, attest that I have discussed with the patient the benefits, risks, side-effects, alternatives, likelihood of achieving goals, and potential problems during recovery for the procedure that I have provided informed consent. Date  Time: 05/05/2018  1:58 PM  Pre-Procedure Preparation:  Monitoring: As per clinic protocol. Respiration, ETCO2, SpO2, BP, heart rate and rhythm monitor placed and checked for adequate function Safety Precautions: Patient was assessed for positional comfort and pressure points before starting the procedure. Time-out: I initiated and conducted the "Time-out" before starting the procedure, as per protocol. The patient was asked to participate by confirming the accuracy of the "Time Out" information. Verification of the correct person, site, and procedure were performed and confirmed by me, the nursing staff, and the patient. "Time-out" conducted as per Joint Commission's Universal Protocol (UP.01.01.01). Time: 1522  Description of Procedure Process:   Position: Prone Target Area: Sacrococcygeal nerve within the distal caudal canal Approach: Posterior approach. Area Prepped: Entire Posterior Sacrococcygeal Region Prepping solution: Hibiclens (4.0% Chlorhexidine gluconate solution) Safety Precautions: Aspiration looking for blood return was conducted prior to all injections. At no point did we inject any  substances, as a needle was being advanced. No attempts were made at seeking any paresthesias. Safe injection practices and needle disposal techniques used. Medications properly checked for expiration dates. SDV (single dose vial) medications used. Description of the Procedure: Protocol guidelines were followed. The patient was placed in position over the procedure table. The target area was identified and the area prepped in the usual manner. The skin and muscle were infiltrated with local anesthetic. Appropriate amount of time allowed to pass for local anesthetics to take effect.  Radiofrequency needles were introduced to the target area using fluoroscopic guidance. Using the NeuroTherm NT1100 Radiofrequency Generator, sensory stimulation using 50 Hz was used to locate & identify the nerve, making sure that the needle was positioned such that there was no sensory stimulation below 0.3 V or above 0.7 V. Stimulation using 2 Hz was used to evaluate the motor component. Care was taken not to lesion any nerves that demonstrated motor stimulation of the lower extremities at an output of less than 2.5 times that of the sensory threshold, or a maximum of 2.0 V. Once satisfactory placement of the needles was achieved, the numbing solution was slowly injected after negative aspiration. After waiting for at least 2 minutes, the ablation was performed at 80 degrees C for 60 seconds, using regular Radiofrequency settings. Once the procedure was completed, the needles were then removed and the area cleansed, making sure to leave some of the prepping solution back to take advantage of its long term bactericidal properties. Intra-operative Compliance: Compliant Vitals:   05/05/18 1540 05/05/18 1551 05/05/18 1559 05/05/18 1609  BP: (!) 125/94 (!) 143/92 135/84 (!) 141/93  Pulse:      Resp: 18 14 (!) 21 15  Temp:  97.6 F (36.4 C)    TempSrc:  Temporal    SpO2: 96% 100% 98% 100%  Weight:      Height:        Start  Time: 1522 hrs. End Time: 1538 hrs. Materials & Medications:  Needle(s) Type: Teflon-coated, curved tip, Radiofrequency needle(s) Gauge: 22G Length: 10cm Medication(s): Please see orders for medications and dosing details.  Imaging Guidance (Spinal):  Type of Imaging Technique: Fluoroscopy Guidance (Spinal) Indication(s): Assistance in needle guidance and placement for procedures requiring needle placement in or near specific anatomical locations not easily accessible without such assistance. Exposure Time: Please see nurses notes. Contrast: Before injecting any contrast, we confirmed that the patient did not have an allergy to iodine, shellfish, or radiological contrast. Once satisfactory needle placement was completed at the desired level, radiological contrast was injected. Contrast injected under live fluoroscopy. No contrast complications. See chart for type and volume of contrast used. Fluoroscopic Guidance: I was personally present during the use of fluoroscopy. "Tunnel Vision Technique" used to obtain the best possible view of the target area. Parallax error corrected before commencing the procedure. "Direction-depth-direction" technique used to introduce the needle under continuous pulsed fluoroscopy. Once target was reached, antero-posterior, oblique, and lateral fluoroscopic projection used confirm needle placement in all planes. Images permanently stored in EMR. Interpretation: I personally interpreted the imaging intraoperatively. Adequate needle placement confirmed in multiple planes. Appropriate spread of contrast into desired area was observed. No evidence of afferent or efferent intravascular uptake. No intrathecal or subarachnoid spread observed. Permanent images saved into the patient's record.  Antibiotic Prophylaxis:   Anti-infectives (From admission, onward)   None     Indication(s): None identified  Post-operative Assessment:  Post-procedure Vital Signs:  Pulse/HCG  Rate: 8667 Temp: 97.6 F (36.4 C) Resp: 15 BP: (!) 141/93 SpO2: 100 %  EBL: None  Complications: No immediate post-treatment complications observed by team, or reported by patient.  Note: The patient tolerated the entire procedure well. A repeat set of vitals were taken after the procedure and the patient was kept under observation following institutional policy, for this type of procedure. Post-procedural neurological assessment was performed, showing return to baseline, prior to discharge. The patient was provided with post-procedure discharge instructions, including a section on how to identify potential problems.  Should any problems arise concerning this procedure, the patient was given instructions to immediately contact us, at any time, without hesitation. In any case, we plan to contact the patient by telephone for a follow-up status report regarding this interventional procedure.  Comments:  No additional relevant information.  Plan of Care   Imaging Orders     DG C-Arm 1-60 Min-No Report  Procedure Orders     RADIOFREQUENCY, SACROCOCCYGEAL  Medications ordered for procedure: Meds ordered this encounter  Medications  . lidocaine (XYLOCAINE) 2 % (with pres) injection 400 mg  . midazolam (VERSED) 5 MG/5ML injection 1-2 mg    Make sure Flumazenil is available in the pyxis when using this medication. If oversedation occurs, administer 0.2 mg IV over 15 sec. If after 45 sec no response, administer 0.2 mg again over 1 min; may repeat at 1 min intervals; not to exceed 4 doses (1 mg)  . fentaNYL (SUBLIMAZE) injection 25-50 mcg    Make sure Narcan is available in the pyxis when using this medication. In the event of respiratory depression (RR< 8/min): Titrate NARCAN (naloxone) in increments of 0.1 to 0.2 mg IV at 2-3 minute intervals, until desired degree of reversal.  . lactated ringers infusion 1,000 mL  . ropivacaine (PF) 2 mg/mL (0.2%) (NAROPIN) injection 3 mL  . triamcinolone  acetonide (KENALOG-40) injection 40 mg  . oxyCODONE-acetaminophen (PERCOCET) 5-325 MG tablet    Sig: Take 1 tablet by mouth every 6 (six) hours as needed for up to 7 days for severe pain.    Dispense:  28 tablet    Refill:  0    For acute post-operative pain. Not to be refilled. To last 7 days.   Medications administered: We administered lidocaine, midazolam, fentaNYL, lactated ringers, ropivacaine (PF) 2 mg/mL (0.2%), and triamcinolone acetonide.  See the medical record for exact dosing, route, and time of administration.  New Prescriptions   OXYCODONE-ACETAMINOPHEN (PERCOCET) 5-325 MG TABLET    Take 1 tablet by mouth every 6 (six) hours as needed for up to 7 days for severe pain.   Disposition: Discharge home  Discharge Date & Time: 05/05/2018; 1613 hrs.   Physician-requested Follow-up: Return for Post-RFA eval (6 wks), w/ Dr. Laban Emperor.  Future Appointments  Date Time Provider Department Center  05/11/2018 10:00 AM [facility]  05/12/2018 10:00 AM [facility]  06/15/2018 11:15 AM Delano Metz, MD [facility] None   Primary Care Physician: [facility] Location[facility] Note by: Oswaldo Done, MD Date: 05/05/2018; Time: 2:58 PM  Disclaimer:  Medicine is not an Visual merchandiser. The only guarantee in medicine is that nothing is guaranteed. It is important to note that the decision to proceed with this intervention was based on the information collected from the patient. The Data and conclusions were drawn from the patient's questionnaire, the interview, and the physical examination. Because the information was provided in large part by the patient, it cannot be guaranteed that it has not been purposely or unconsciously manipulated. Every effort has been made to obtain as much relevant data as possible for this evaluation. It is important to note that the conclusions that lead to this procedure  are derived in large part from the available data. Always take into account that the treatment will also be dependent on availability of resources and existing treatment guidelines, considered by other Pain Management Practitioners as being common knowledge and practice, at the time of the intervention. For  Medico-Legal purposes, it is also important to point out that variation in procedural techniques and pharmacological choices are the acceptable norm. The indications, contraindications, technique, and results of the above procedure should only be interpreted and judged by a Board-Certified Interventional Pain Specialist with extensive familiarity and expertise in the same exact procedure and technique.

## 2018-05-06 ENCOUNTER — Telehealth: Payer: Self-pay

## 2018-05-06 NOTE — Telephone Encounter (Signed)
Post procedure phone call.  Patient states she is doing fine right now.

## 2018-05-09 ENCOUNTER — Telehealth: Payer: Self-pay | Admitting: *Deleted

## 2018-05-09 NOTE — Telephone Encounter (Signed)
Work note completed. Patient will pick up tomorrow (05-10-18).

## 2018-05-11 ENCOUNTER — Ambulatory Visit: Payer: 59 | Admitting: Orthotics

## 2018-05-11 DIAGNOSIS — M722 Plantar fascial fibromatosis: Secondary | ICD-10-CM

## 2018-05-11 DIAGNOSIS — M659 Synovitis and tenosynovitis, unspecified: Secondary | ICD-10-CM

## 2018-05-11 NOTE — Progress Notes (Signed)
Patient concerned that f/o will affect her balance due to scoliosis.  She also had questions about effectiveness in addressing her left foot rolling in.  Since Dr. Logan BoresEvans also dx ankle synovitis and OA, and her pronounced navicular drop/talus shift, an [place] brace might be more benefitial than f/o.  Dawn will verify [employer] coverage and Dr. Logan BoresEvans will be consulted.

## 2018-05-12 ENCOUNTER — Ambulatory Visit
Admission: RE | Admit: 2018-05-12 | Discharge: 2018-05-12 | Disposition: A | Discharge status: Home / Self Care | Payer: 59 | Source: Ambulatory Visit | Attending: Family Medicine | Admitting: Family Medicine

## 2018-05-12 DIAGNOSIS — Z1231 Encounter for screening mammogram for malignant neoplasm of breast: Secondary | ICD-10-CM | POA: Diagnosis present

## 2018-05-27 ENCOUNTER — Other Ambulatory Visit: Payer: 59 | Admitting: Orthotics

## 2018-05-31 ENCOUNTER — Telehealth: Payer: Self-pay | Admitting: Podiatry

## 2018-05-31 NOTE — Telephone Encounter (Signed)
Left message for pt to call me to discuss the brace [facility]

## 2018-05-31 NOTE — Telephone Encounter (Signed)
Pt returned call and states she has met her deductible and that she had canceled her appt and is using the lift and it is helping so she is wanting to hold off on the brace.

## 2018-06-15 ENCOUNTER — Ambulatory Visit: Payer: 59 | Admitting: Pain Medicine

## 2018-06-21 NOTE — Progress Notes (Deleted)
Patient's Name: Sandra Strong  MRN: [id number]  Referring Provider: [facility]  DOB: 11/19/1956  PCP: Donnie Coffin, MD  DOS: 06/22/2018  Note by: Gaspar Cola, MD  Service setting: Ambulatory outpatient  Specialty: Interventional Pain Management  Location: [facility] (AMB) Pain Management Facility    Patient type: Established   Primary Reason(s) for Visit: Encounter for post-procedure evaluation of chronic illness with mild to moderate exacerbation CC: No chief complaint on file.  HPI  Sandra Strong is a 62 y.o. year old, female patient, who comes today for a post-procedure evaluation. She has Chronic low back pain (Primary Area of Pain) (Bilateral) (R>L); Chronic ankle pain (Secondary Area of Pain) (Bilateral) (R>L); Chronic knee pain (Tertiary Area of Pain) (Bilateral) (L>R); Chronic pain syndrome; Opiate use; Pharmacologic therapy; Disorder of skeletal system; Problems influencing health status; Elevated C-reactive protein (CRP); Chronic sacroiliac joint pain (Bilateral) (R>L); Spondylosis without myelopathy or radiculopathy, lumbosacral region; Lumbar facet syndrome (Bilateral) (R>L); Other specified dorsopathies, sacral and sacrococcygeal region; Levoscoliosis; Lumbar facet arthropathy; 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 Her primarily concern today is the No chief complaint on file.  Pain Assessment: Location:     Radiating:   Onset:   Duration:   Quality:   Severity:  /10 (subjective, self-reported pain score)  Note: Reported level is compatible with observation.                         When  using our objective Pain Scale, levels between 6 and 10/10 are said to belong in an emergency room, as it progressively worsens from a 6/10, described as severely limiting, requiring emergency care not usually available at an outpatient pain management facility. At a 6/10 level, communication becomes difficult and requires great effort. Assistance to reach the emergency department may be required. Facial flushing and profuse sweating along with potentially dangerous increases in heart rate and blood pressure will be evident. Effect on ADL:   Timing:   Modifying factors:   BP:    HR:    Sandra Strong comes in today for post-procedure evaluation after the treatment done on 05/09/2018.  Further details on both, my assessment(s), as well as the proposed treatment plan, please see below.  Post-Procedure Assessment  05/05/2018 Procedure: Therapeutic (Midline) sacrococcygeal nerve RFA#1under fluoroscopic guidance and IV sedation Pre-procedure pain score:  1/10 Post-procedure pain score: 0/10 (100% relief) Influential Factors: BMI:   Intra-procedural challenges: None observed.         Assessment challenges: None detected.              Reported side-effects: None.        Post-procedural adverse reactions or complications: None reported         Sedation: Sedation provided. When no sedatives are used, the analgesic levels obtained are directly associated to the effectiveness of the local anesthetics. However, when sedation is provided, the level of analgesia obtained during the initial 1 hour following the intervention, is believed to be the result of a combination of factors. These factors may include, but are not limited to: 1. The effectiveness of the local anesthetics used. 2. The effects of the analgesic(s) and/or anxiolytic(s) used. 3. The degree of  discomfort experienced by the patient at the time of the procedure. 4. The patients ability and reliability in recalling and recording the events. 5. The  presence and influence of possible secondary gains and/or psychosocial factors. Reported result: Relief experienced during the 1st hour after the procedure:   (Ultra-Short Term Relief)            Interpretative annotation: Clinically appropriate result. Analgesia during this period is likely to be Local Anesthetic and/or IV Sedative (Analgesic/Anxiolytic) related.          Effects of local anesthetic: The analgesic effects attained during this period are directly associated to the localized infiltration of local anesthetics and therefore cary significant diagnostic value as to the etiological location, or anatomical origin, of the pain. Expected duration of relief is directly dependent on the pharmacodynamics of the local anesthetic used. Long-acting (4-6 hours) anesthetics used.  Reported result: Relief during the next 4 to 6 hour after the procedure:   (Short-Term Relief)            Interpretative annotation: Clinically appropriate result. Analgesia during this period is likely to be Local Anesthetic-related.          Long-term benefit: Defined as the period of time past the expected duration of local anesthetics (1 hour for short-acting and 4-6 hours for long-acting). With the possible exception of prolonged sympathetic blockade from the local anesthetics, benefits during this period are typically attributed to, or associated with, other factors such as analgesic sensory neuropraxia, antiinflammatory effects, or beneficial biochemical changes provided by agents other than the local anesthetics.  Reported result: Extended relief following procedure:   (Long-Term Relief)            Interpretative annotation: Clinically appropriate result. Good relief. No permanent benefit expected. Inflammation plays a part in the etiology to the pain.          Current benefits: Defined as reported results that persistent at this point in time.   Analgesia: *** %            Function: Somewhat improved ROM: Somewhat  improved Interpretative annotation: Recurrence of symptoms. No permanent benefit expected. Effective diagnostic intervention.          Interpretation: Results would suggest a successful diagnostic intervention.                  Plan:  Please see "Plan of Care" for details.                Laboratory Chemistry  Inflammation Markers (CRP: Acute Phase) (ESR: Chronic Phase) Lab Results  Component Value Date   CRP 7.7 (H) 01/06/2018   ESRSEDRATE 8 01/06/2018                         Renal Markers Lab Results  Component Value Date   BUN 13 01/06/2018   CREATININE 0.67 01/06/2018   BCR 19 01/06/2018   GFRAA 111 01/06/2018   GFRNONAA 97 01/06/2018                             Hepatic Markers Lab Results  Component Value Date   AST 17 01/06/2018   ALBUMIN 4.2 01/06/2018                        Neuropathy Markers No results found for: HGBA1C, HIV  Hematology Parameters Lab Results  Component Value Date   PLT 330 03/20/2017   HGB 13.4 03/20/2017   HCT 39.9 03/20/2017                        CV Markers Lab Results  Component Value Date   CKTOTAL 105 09/22/2012   CKMB 1.3 09/22/2012   TROPONINI <0.03 03/20/2017                         Note: Lab results reviewed.  Recent Diagnostic Imaging Results  MM 3D SCREEN BREAST BILATERAL CLINICAL DATA:  Screening.  EXAM: DIGITAL SCREENING BILATERAL MAMMOGRAM WITH TOMO AND CAD  COMPARISON:  Previous exam(s).  ACR Breast Density Category b: There are scattered areas of fibroglandular density.  FINDINGS: There are no findings suspicious for malignancy. Images were processed with CAD.  IMPRESSION: No mammographic evidence of malignancy. A result letter of this screening mammogram will be mailed directly to the patient.  RECOMMENDATION: Screening mammogram in one year. (Code:[id number]-B-01Y)  BI-RADS CATEGORY  1: Negative.  Electronically Signed   By: Margarette Canada M.D.   On: 05/12/2018 13:02  Complexity  Note: I personally reviewed the fluoroscopic imaging of the procedure.                        Meds   Current Outpatient Medications:  .  acetaminophen (TYLENOL) 500 MG tablet, Take 500 mg by mouth every 6 (six) hours as needed. 2 tabs, Disp: , Rfl:  .  albuterol (PROVENTIL HFA;VENTOLIN HFA) 108 (90 Base) MCG/ACT inhaler, Inhale 2 puffs into the lungs every 4 (four) hours as needed for wheezing or shortness of breath., Disp: , Rfl:  .  albuterol (PROVENTIL) (2.5 MG/3ML) 0.083% nebulizer solution, Take 2.5 mg by nebulization every 6 (six) hours as needed for wheezing or shortness of breath., Disp: , Rfl:  .  alendronate (FOSAMAX) 70 MG tablet, Take 70 mg by mouth once a week. Take with a full glass of water on an empty stomach., Disp: , Rfl:  .  Calcium Carbonate-Simethicone 750-80 MG CHEW, Chew 2 each by mouth 1 day or 1 dose., Disp: , Rfl:  .  cetirizine (ZYRTEC) 10 MG tablet, Take 10 mg by mouth daily., Disp: , Rfl:  .  Cholecalciferol (VITAMIN D3) 2000 units TABS, Take 2,000 Units by mouth daily., Disp: , Rfl:  .  diphenhydrAMINE (BENADRYL) 25 mg capsule, Take 50 mg by mouth at bedtime as needed., Disp: , Rfl:  .  etodolac (LODINE) 400 MG tablet, Take 400 mg by mouth 2 (two) times daily., Disp: , Rfl:  .  fluticasone (FLONASE) 50 MCG/ACT nasal spray, Place 2 sprays into both nostrils daily., Disp: , Rfl:  .  fluticasone (FLOVENT HFA) 110 MCG/ACT inhaler, Inhale 2 puffs into the lungs 2 (two) times daily., Disp: , Rfl:  .  HYDROcodone-acetaminophen (NORCO/VICODIN) 5-325 MG tablet, Take 1 tablet by mouth daily as needed for severe pain., Disp: 30 tablet, Rfl: 0 .  Magnesium Oxide 500 MG CAPS, Take 1 capsule (500 mg total) by mouth 2 (two) times daily at 8 am and 10 pm., Disp: 60 capsule, Rfl: 2 .  montelukast (SINGULAIR) 10 MG tablet, Take 10 mg by mouth at bedtime., Disp: , Rfl:  .  Multiple Vitamins-Minerals (MULTIVITAMIN WITH MINERALS) tablet, Take 1 tablet by mouth daily., Disp: , Rfl:  .   omeprazole (PRILOSEC) 40 MG capsule, Take 40  mg by mouth daily., Disp: , Rfl:  .  oxyCODONE-acetaminophen (PERCOCET) 5-325 MG tablet, Take 1 tablet by mouth every 6 (six) hours as needed for up to 7 days for severe pain., Disp: 28 tablet, Rfl: 0 .  Probiotic Product (PROBIOTIC-10 PO), Take by mouth daily., Disp: , Rfl:  .  sertraline (ZOLOFT) 100 MG tablet, Take 100 mg by mouth daily., Disp: , Rfl:  .  vitamin C (ASCORBIC ACID) 500 MG tablet, Take 500 mg by mouth daily., Disp: , Rfl:   ROS  Constitutional: Denies any fever or chills Gastrointestinal: No reported hemesis, hematochezia, vomiting, or acute GI distress Musculoskeletal: Denies any acute onset joint swelling, redness, loss of ROM, or weakness Neurological: No reported episodes of acute onset apraxia, aphasia, dysarthria, agnosia, amnesia, paralysis, loss of coordination, or loss of consciousness  Allergies  Sandra Strong is allergic to spiriva handihaler [tiotropium bromide monohydrate]; advair diskus [fluticasone-salmeterol]; erythromycin; and sulfa antibiotics.  PFSH  Drug: Sandra Strong  reports that she does not use drugs. Alcohol:  reports that she does not drink alcohol. Tobacco:  reports that she has been smoking.  She started smoking about 49 years ago. She has a 129.00 pack-year smoking history. She has never used smokeless tobacco. Medical:  has a past medical history of Anxiety, Arthritis, Asthma, COPD (chronic obstructive pulmonary disease) ([place]), GERD (gastroesophageal reflux disease), and Hypertension. Surgical: Sandra Strong  has a past surgical history that includes Rotator cuff repair (Right); Abdominal hysterectomy; Hernia repair; Foot surgery (Left); and carpel tunnel (Bilateral). Family: family history includes Breast cancer in her paternal aunt; Breast cancer (age of onset: 5) in her mother.  Constitutional Exam  General appearance: Well nourished, well developed, and well hydrated. In no apparent acute distress There were  no vitals filed for this visit. BMI Assessment: Estimated body mass index is 36.58 kg/m as calculated from the following:   Height as of 05/05/18: '5\' 2"'  (1.575 m).   Weight as of 05/05/18: 200 lb (90.7 kg).  BMI interpretation table: BMI level Category Range association with higher incidence of chronic pain  <18 kg/m2 Underweight   18.5-24.9 kg/m2 Ideal body weight   25-29.9 kg/m2 Overweight Increased incidence by 20%  30-34.9 kg/m2 Obese (Class I) Increased incidence by 68%  35-39.9 kg/m2 Severe obesity (Class II) Increased incidence by 136%  >40 kg/m2 Extreme obesity (Class III) Increased incidence by 254%   Patient's current BMI Ideal Body weight  There is no height or weight on file to calculate BMI. Patient weight not recorded   BMI Readings from Last 4 Encounters:  05/05/18 36.58 kg/m  04/14/18 36.76 kg/m  03/21/18 36.58 kg/m  02/24/18 36.95 kg/m   Wt Readings from Last 4 Encounters:  05/05/18 200 lb (90.7 kg)  04/14/18 201 lb (91.2 kg)  03/21/18 200 lb (90.7 kg)  02/24/18 202 lb (91.6 kg)  Psych/Mental status: Alert, oriented x 3 (person, place, & time)       Eyes: PERLA Respiratory: No evidence of acute respiratory distress  Cervical Spine Area Exam  Skin & Axial Inspection: No masses, redness, edema, swelling, or associated skin lesions Alignment: Symmetrical Functional ROM: Unrestricted ROM      Stability: No instability detected Muscle Tone/Strength: Functionally intact. No obvious neuro-muscular anomalies detected. Sensory (Neurological): Unimpaired Palpation: No palpable anomalies              Upper Extremity (UE) Exam    Side: Right upper extremity  Side: Left upper extremity  Skin & Extremity Inspection: Skin  color, temperature, and hair growth are WNL. No peripheral edema or cyanosis. No masses, redness, swelling, asymmetry, or associated skin lesions. No contractures.  Skin & Extremity Inspection: Skin color, temperature, and hair growth are WNL. No  peripheral edema or cyanosis. No masses, redness, swelling, asymmetry, or associated skin lesions. No contractures.  Functional ROM: Unrestricted ROM          Functional ROM: Unrestricted ROM          Muscle Tone/Strength: Functionally intact. No obvious neuro-muscular anomalies detected.  Muscle Tone/Strength: Functionally intact. No obvious neuro-muscular anomalies detected.  Sensory (Neurological): Unimpaired          Sensory (Neurological): Unimpaired          Palpation: No palpable anomalies              Palpation: No palpable anomalies              Provocative Test(s):  Phalen's test: deferred Tinel's test: deferred Apley's scratch test (touch opposite shoulder):  Action 1 (Across chest): deferred Action 2 (Overhead): deferred Action 3 (LB reach): deferred   Provocative Test(s):  Phalen's test: deferred Tinel's test: deferred Apley's scratch test (touch opposite shoulder):  Action 1 (Across chest): deferred Action 2 (Overhead): deferred Action 3 (LB reach): deferred    Thoracic Spine Area Exam  Skin & Axial Inspection: No masses, redness, or swelling Alignment: Symmetrical Functional ROM: Unrestricted ROM Stability: No instability detected Muscle Tone/Strength: Functionally intact. No obvious neuro-muscular anomalies detected. Sensory (Neurological): Unimpaired Muscle strength & Tone: No palpable anomalies  Lumbar Spine Area Exam  Skin & Axial Inspection: No masses, redness, or swelling Alignment: Symmetrical Functional ROM: Unrestricted ROM       Stability: No instability detected Muscle Tone/Strength: Functionally intact. No obvious neuro-muscular anomalies detected. Sensory (Neurological): Unimpaired Palpation: No palpable anomalies       Provocative Tests: Lumbar Hyperextension/rotation test: deferred today       Lumbar quadrant test (Kemp's test): deferred today       Lumbar Lateral bending test: deferred today       Patrick's Maneuver: deferred today                    FABER test: deferred today                   Thigh-thrust test: deferred today       S-I compression test: deferred today       S-I distraction test: deferred today        Gait & Posture Assessment  Ambulation: Unassisted Gait: Relatively normal for age and body habitus Posture: WNL   Lower Extremity Exam    Side: Right lower extremity  Side: Left lower extremity  Stability: No instability observed          Stability: No instability observed          Skin & Extremity Inspection: Skin color, temperature, and hair growth are WNL. No peripheral edema or cyanosis. No masses, redness, swelling, asymmetry, or associated skin lesions. No contractures.  Skin & Extremity Inspection: Skin color, temperature, and hair growth are WNL. No peripheral edema or cyanosis. No masses, redness, swelling, asymmetry, or associated skin lesions. No contractures.  Functional ROM: Unrestricted ROM                  Functional ROM: Unrestricted ROM                  Muscle Tone/Strength: Functionally intact.  No obvious neuro-muscular anomalies detected.  Muscle Tone/Strength: Functionally intact. No obvious neuro-muscular anomalies detected.  Sensory (Neurological): Unimpaired  Sensory (Neurological): Unimpaired  Palpation: No palpable anomalies  Palpation: No palpable anomalies   Assessment  Primary Diagnosis & Pertinent Problem List: There were no encounter diagnoses.  Status Diagnosis  Controlled Controlled Controlled No diagnosis found.  Problems updated and reviewed during this visit: No problems updated. Plan of Care  Pharmacotherapy (Medications Ordered): No orders of the defined types were placed in this encounter.  Medications administered today: Sandra Strong had no medications administered during this visit.  Procedure Orders    No procedure(s) ordered today   Lab Orders  No laboratory test(s) ordered today   Imaging Orders  No imaging studies ordered today   Referral Orders   No referral(s) requested today    Interventional management options: Planned, scheduled, and/or pending:   Diagnostic bilateral lumbar facet block #1under fluoroscopic guidance and IV sedation   Considering:   Diagnostic bilateral lumbar facet nerve block #1  Possible bilateral lumbar facet RFA Diagnostic bilateral sacroiliac joint block Possible bilateral sacroiliac joint RFA Diagnosticbilateral intra-articular knee injection Diagnostic bilateral Hyalgan series Diagnostic bilateral genicular Nerve block Possible bilateral genicular RFA Diagnostic bilateral ankle injections   Palliative PRN treatment(s):   Palliative (Midline) sacrococcygeal nerve RFA#1   Provider-requested follow-up: No follow-ups on file.  Future Appointments  Date Time Provider Victor  06/22/2018  9:15 AM Milinda Pointer, MD [facility] None   Primary Care Physician: Donnie Coffin, MD Location: [facility] Note by: Gaspar Cola, MD Date: 06/22/2018; Time: 9:05 AM

## 2018-06-22 ENCOUNTER — Ambulatory Visit: Payer: 59 | Admitting: Pain Medicine

## 2018-07-01 ENCOUNTER — Ambulatory Visit: Payer: 59 | Admitting: Internal Medicine

## 2018-07-11 ENCOUNTER — Ambulatory Visit: Payer: 59 | Attending: Pain Medicine | Admitting: Nurse Practitioner

## 2018-07-11 ENCOUNTER — Other Ambulatory Visit: Payer: Self-pay

## 2018-07-11 ENCOUNTER — Ambulatory Visit: Payer: 59 | Admitting: Pain Medicine

## 2018-07-11 ENCOUNTER — Encounter: Payer: Self-pay | Admitting: Nurse Practitioner

## 2018-07-11 VITALS — BP 158/106 | HR 73 | Temp 98.0°F | Ht 62.0 in | Wt 202.0 lb

## 2018-07-11 DIAGNOSIS — M47818 Spondylosis without myelopathy or radiculopathy, sacral and sacrococcygeal region: Secondary | ICD-10-CM

## 2018-07-11 DIAGNOSIS — K219 Gastro-esophageal reflux disease without esophagitis: Secondary | ICD-10-CM | POA: Diagnosis not present

## 2018-07-11 DIAGNOSIS — Z79899 Other long term (current) drug therapy: Secondary | ICD-10-CM | POA: Insufficient documentation

## 2018-07-11 DIAGNOSIS — R7982 Elevated C-reactive protein (CRP): Secondary | ICD-10-CM | POA: Diagnosis not present

## 2018-07-11 DIAGNOSIS — Z881 Allergy status to other antibiotic agents status: Secondary | ICD-10-CM | POA: Insufficient documentation

## 2018-07-11 DIAGNOSIS — Z882 Allergy status to sulfonamides status: Secondary | ICD-10-CM | POA: Insufficient documentation

## 2018-07-11 DIAGNOSIS — K6289 Other specified diseases of anus and rectum: Secondary | ICD-10-CM | POA: Diagnosis present

## 2018-07-11 DIAGNOSIS — M47817 Spondylosis without myelopathy or radiculopathy, lumbosacral region: Secondary | ICD-10-CM | POA: Diagnosis not present

## 2018-07-11 DIAGNOSIS — G894 Chronic pain syndrome: Secondary | ICD-10-CM | POA: Diagnosis not present

## 2018-07-11 DIAGNOSIS — M5136 Other intervertebral disc degeneration, lumbar region: Secondary | ICD-10-CM | POA: Insufficient documentation

## 2018-07-11 DIAGNOSIS — M533 Sacrococcygeal disorders, not elsewhere classified: Secondary | ICD-10-CM

## 2018-07-11 DIAGNOSIS — M47816 Spondylosis without myelopathy or radiculopathy, lumbar region: Secondary | ICD-10-CM

## 2018-07-11 NOTE — Progress Notes (Signed)
Nursing Pain Medication Assessment:  Safety precautions to be maintained throughout the outpatient stay will include: orient to surroundings, keep bed in low position, maintain call bell within reach at all times, provide assistance with transfer out of bed and ambulation.  Medication Inspection Compliance: Pill count conducted under aseptic conditions, in front of the patient. Neither the pills nor the bottle was removed from the patient's sight at any time. Once count was completed pills were immediately returned to the patient in their original bottle.  Medication #1: Hydrocodone/APAP Pill/Patch Count: 0 of 30 pills remain Pill/Patch Appearance: Markings consistent with prescribed medication Bottle Appearance: Standard pharmacy container. Clearly labeled. Filled Date: 3 / 20 / 2019 Last Medication intake:  3 week ago  Medication #2: Oxycodone/APAP Pill/Patch Count: 7 of 28 pills remain Pill/Patch Appearance: Markings consistent with prescribed medication Bottle Appearance: Standard pharmacy container. Clearly labeled. Filled Date: 6 / 6 / 2019 Last Medication intake:  Yesterday

## 2018-07-11 NOTE — Progress Notes (Signed)
Patient's Name: Sandra Strong  MRN: [id number]  Referring Provider: Donnie Coffin, MD  DOB: 07-Aug-1956  PCP: Donnie Coffin, MD  DOS: 07/11/2018  Note by: Vevelyn Francois NP  Service setting: Ambulatory outpatient  Specialty: Interventional Pain Management  Location: [facility] (AMB) Pain Management Facility    Patient type: Established    Primary Reason(s) for Visit: Encounter for prescription drug management & post-procedure evaluation of chronic illness with mild to moderate exacerbation(Level of risk: moderate) CC: Rectal Pain  HPI  Sandra Strong is a 62 y.o. year old, female patient, who comes today for a post-procedure evaluation and medication management. She has Chronic low back pain (Primary Area of Pain) (Bilateral) (R>L); Chronic ankle pain (Secondary Area of Pain) (Bilateral) (R>L); Chronic knee pain (Tertiary Area of Pain) (Bilateral) (L>R); Chronic pain syndrome; Opiate use; Pharmacologic therapy; Disorder of skeletal system; Problems influencing health status; Elevated C-reactive protein (CRP); Chronic sacroiliac joint pain (Bilateral) (R>L); Spondylosis without myelopathy or radiculopathy, lumbosacral region; Lumbar facet syndrome (Bilateral) (R>L); Other specified dorsopathies, sacral and sacrococcygeal region; Levoscoliosis; Lumbar facet arthropathy; 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 Her primarily concern today is the Rectal Pain  Pain Assessment: Location:     Radiating: tailbone to buttock, back of legs Onset: More than a month ago Duration: Chronic pain Quality:  Pressure Severity: 0-No pain/10 (subjective, self-reported pain score)  Note: Reported level is compatible with observation.                          Effect on ADL: limits daily activites Timing: Intermittent Modifying factors: laying down and rest BP: (!) 158/106  HR: 73  Sandra Strong was last seen on 05/05/2018 for a procedure. During today's appointment we reviewed Sandra Strong's post-procedure results, as well as her outpatient medication regimen. She admits that she is having more back pain than SI joint pain which she uses the narcotic.  She admits that she had past steroid injections in the past with Dr Sharlet Salina. She admits that it was effective for about 8 months,. She is having some swelling and pain in the area. She admits that it is on both side. She denies the numbness and tingling in her legs. She admits that she completed 2 weeks of PT. She admits that she was not pleased with the facility.  She would like to hold off on the pain medication is not effective.   Further details on both, my assessment(s), as well as the proposed treatment plan, please see below.  Controlled Substance Pharmacotherapy Assessment REMS (Risk Evaluation and Mitigation Strategy)  Analgesic: hydrocodoneacetaminophen 5/325 tablet daily MME/day:'5mg'$ /day Chauncey Fischer, RN  07/11/2018  9:28 AM  Sign at close encounter Nursing Pain Medication Assessment:  Safety precautions to be maintained throughout the outpatient stay will include: orient to surroundings, keep bed in low position, maintain call bell within reach at all times, provide assistance with transfer out of bed and ambulation.  Medication Inspection Compliance: Pill count conducted under aseptic conditions, in front of the patient. Neither the pills nor the bottle was removed from the patient's sight at any time. Once count was completed pills were immediately returned to the patient in their original  bottle.  Medication #1: Hydrocodone/APAP Pill/Patch  Count: 0 of 30 pills remain Pill/Patch Appearance: Markings consistent with prescribed medication Bottle Appearance: Standard pharmacy container. Clearly labeled. Filled Date: 3 / 20 / 2019 Last Medication intake:  3 week ago  Medication #2: Oxycodone/APAP Pill/Patch Count: 7 of 28 pills remain Pill/Patch Appearance: Markings consistent with prescribed medication Bottle Appearance: Standard pharmacy container. Clearly labeled. Filled Date: 6 / 6 / 2019 Last Medication intake:  Yesterday   Pharmacokinetics: Liberation and absorption (onset of action): WNL Distribution (time to peak effect): WNL Metabolism and excretion (duration of action): WNL         Pharmacodynamics: Desired effects: Analgesia: Sandra Strong reports >50% benefit. Functional ability: Patient reports that medication allows her to accomplish basic ADLs Clinically meaningful improvement in function (CMIF): Sustained CMIF goals met Perceived effectiveness: Described as relatively effective, allowing for increase in activities of daily living (ADL) Undesirable effects: Side-effects or Adverse reactions: None reported Monitoring: [place] PMP: Online review of the past 17-monthperiod conducted. Compliant with practice rules and regulations Last UDS on record: Summary  Date Value Ref Range Status  01/06/2018 FINAL  Final    Comment:    ==================================================================== TOXASSURE COMP DRUG ANALYSIS,UR ==================================================================== Test                             Result       Flag       Units Drug Present   Codeine                        88                      ng/mg creat   Norcodeine                     90                      ng/mg creat    Sources of codeine include scheduled prescription medications.    Norcodeine is an expected metabolite of codeine.   Zolpidem Acid                  PRESENT    Zolpidem acid is an expected metabolite of  zolpidem.   Sertraline                     PRESENT   Desmethylsertraline            PRESENT    Desmethylsertraline is an expected metabolite of sertraline.   Acetaminophen                  PRESENT   Diphenhydramine                PRESENT ==================================================================== Test                      Result    Flag   Units      Ref Range   Creatinine              68               mg/dL      >=20 ==================================================================== Declared Medications:  Medication list was not provided. ==================================================================== For clinical consultation, please call [phone] ====================================================================    UDS interpretation: Compliant  Medication Assessment Form: Reviewed. Patient indicates being compliant with therapy Treatment compliance: Compliant Risk Assessment Profile: Aberrant behavior: See prior evaluations. None observed or detected today Comorbid factors increasing risk of overdose: See prior notes. No additional risks detected today Risk of substance use disorder (SUD): Low Opioid Risk Tool - 07/11/18 0939      Family History of Substance Abuse   Alcohol  Positive Female    Illegal Drugs  Positive Female    Rx Drugs  Positive Female or Female      Personal History of Substance Abuse   Alcohol  Negative    Illegal Drugs  Negative    Rx Drugs  Negative      Total Score   Opioid Risk Tool Scoring  8    Opioid Risk Interpretation  High Risk      ORT Scoring interpretation table:  Score <3 = Low Risk for SUD  Score between 4-7 = Moderate Risk for SUD  Score >8 = High Risk for Opioid Abuse   Risk Mitigation Strategies:  Patient Counseling: Covered Patient-Prescriber Agreement (PPA): Present and active  Notification to other healthcare providers: Done  Pharmacologic Plan: No change in therapy, at this time.              Post-Procedure Assessment  06/062019 Procedure: midline Sacrococcygeal Nerve Radiofrequency Ablation  Pre-procedure pain score:  1/10 Post-procedure pain score: 0/10         Influential Factors: BMI: 36.95 kg/m Intra-procedural challenges: None observed.         Assessment challenges: None detected.              Reported side-effects: None.        Post-procedural adverse reactions or complications: None reported         Sedation: Please see nurses note. When no sedatives are used, the analgesic levels obtained are directly associated to the effectiveness of the local anesthetics. However, when sedation is provided, the level of analgesia obtained during the initial 1 hour following the intervention, is believed to be the result of a combination of factors. These factors may include, but are not limited to: 1. The effectiveness of the local anesthetics used. 2. The effects of the analgesic(s) and/or anxiolytic(s) used. 3. The degree of discomfort experienced by the patient at the time of the procedure. 4. The patients ability and reliability in recalling and recording the events. 5. The presence and influence of possible secondary gains and/or psychosocial factors. Reported result: Relief experienced during the 1st hour after the procedure: 100 % (Ultra-Short Term Relief)            Interpretative annotation: Clinically appropriate result. Analgesia during this period is likely to be Local Anesthetic and/or IV Sedative (Analgesic/Anxiolytic) related.          Effects of local anesthetic: The analgesic effects attained during this period are directly associated to the localized infiltration of local anesthetics and therefore cary significant diagnostic value as to the etiological location, or anatomical origin, of the pain. Expected duration of relief is directly dependent on the pharmacodynamics of the local anesthetic used. Long-acting (4-6 hours) anesthetics used.  Reported result: Relief  during the next 4 to 6 hour after the procedure: 90 %(90 for 6-10 days) (Short-Term Relief)            Interpretative annotation: Clinically appropriate result. Analgesia during this period is likely to be Local Anesthetic-related.          Long-term benefit: Defined as  the period of time past the expected duration of local anesthetics (1 hour for short-acting and 4-6 hours for long-acting). With the possible exception of prolonged sympathetic blockade from the local anesthetics, benefits during this period are typically attributed to, or associated with, other factors such as analgesic sensory neuropraxia, antiinflammatory effects, or beneficial biochemical changes provided by agents other than the local anesthetics.  Reported result: Extended relief following procedure: 90 %(hurts more just before it rains) (Long-Term Relief)            Interpretative annotation: Clinically possible results. Good relief. No permanent benefit expected. Inflammation plays a part in the etiology to the pain.          Current benefits: Defined as reported results that persistent at this point in time.   Analgesia: 75-100 %            Function: Sandra Strong reports improvement in function ROM: Sandra Strong reports improvement in ROM Interpretative annotation: Good relief.    Adequate RF ablation.          Interpretation: Results would suggest adequate radiofrequency ablation.                  Plan:  Please see "Plan of Care" for details.                Laboratory Chemistry  Inflammation Markers (CRP: Acute Phase) (ESR: Chronic Phase) Lab Results  Component Value Date   CRP 7.7 (H) 01/06/2018   ESRSEDRATE 8 01/06/2018                         Rheumatology Markers No results found for: RF, ANA, LABURIC, URICUR, LYMEIGGIGMAB, LYMEABIGMQN, HLAB27                      Renal Function Markers Lab Results  Component Value Date   BUN 13 01/06/2018   CREATININE 0.67 01/06/2018   BCR 19 01/06/2018   GFRAA 111 01/06/2018    GFRNONAA 97 01/06/2018                             Hepatic Function Markers Lab Results  Component Value Date   AST 17 01/06/2018   ALBUMIN 4.2 01/06/2018   ALKPHOS 132 (H) 01/06/2018                        Electrolytes Lab Results  Component Value Date   NA 146 (H) 01/06/2018   K 4.2 01/06/2018   CL 107 (H) 01/06/2018   CALCIUM 9.5 01/06/2018   MG 1.9 01/06/2018                        Neuropathy Markers Lab Results  Component Value Date   VITAMINB12 583 01/06/2018                        Bone Pathology Markers Lab Results  Component Value Date   25OHVITD1 51 01/06/2018   25OHVITD2 <1.0 01/06/2018   25OHVITD3 51 01/06/2018                         Coagulation Parameters Lab Results  Component Value Date   PLT 330 03/20/2017  Cardiovascular Markers Lab Results  Component Value Date   CKTOTAL 105 09/22/2012   CKMB 1.3 09/22/2012   TROPONINI <0.03 03/20/2017   HGB 13.4 03/20/2017   HCT 39.9 03/20/2017                         CA Markers No results found for: CEA, CA125, LABCA2                      Note: Lab results reviewed.  Recent Diagnostic Imaging Results  MM 3D SCREEN BREAST BILATERAL CLINICAL DATA:  Screening.  EXAM: DIGITAL SCREENING BILATERAL MAMMOGRAM WITH TOMO AND CAD  COMPARISON:  Previous exam(s).  ACR Breast Density Category b: There are scattered areas of fibroglandular density.  FINDINGS: There are no findings suspicious for malignancy. Images were processed with CAD.  IMPRESSION: No mammographic evidence of malignancy. A result letter of this screening mammogram will be mailed directly to the patient.  RECOMMENDATION: Screening mammogram in one year. (Code:[id number]-B-01Y)  BI-RADS CATEGORY  1: Negative.  Electronically Signed   By: Margarette Canada M.D.   On: 05/12/2018 13:02  Complexity Note: Imaging results reviewed. Results shared with Sandra Strong, using Layman's terms.                         Meds    Current Outpatient Medications:  .  acetaminophen (TYLENOL) 500 MG tablet, Take 500 mg by mouth every 6 (six) hours as needed. 2 tabs, Disp: , Rfl:  .  albuterol (PROVENTIL HFA;VENTOLIN HFA) 108 (90 Base) MCG/ACT inhaler, Inhale 2 puffs into the lungs every 4 (four) hours as needed for wheezing or shortness of breath., Disp: , Rfl:  .  albuterol (PROVENTIL) (2.5 MG/3ML) 0.083% nebulizer solution, Take 2.5 mg by nebulization every 6 (six) hours as needed for wheezing or shortness of breath., Disp: , Rfl:  .  alendronate (FOSAMAX) 70 MG tablet, Take 70 mg by mouth once a week. Take with a full glass of water on an empty stomach., Disp: , Rfl:  .  Calcium Carbonate-Simethicone 750-80 MG CHEW, Chew 2 each by mouth 1 day or 1 dose., Disp: , Rfl:  .  cetirizine (ZYRTEC) 10 MG tablet, Take 10 mg by mouth daily., Disp: , Rfl:  .  Cholecalciferol (VITAMIN D3) 2000 units TABS, Take 2,000 Units by mouth daily., Disp: , Rfl:  .  diphenhydrAMINE (BENADRYL) 25 mg capsule, Take 50 mg by mouth at bedtime as needed., Disp: , Rfl:  .  etodolac (LODINE) 400 MG tablet, Take 400 mg by mouth 2 (two) times daily., Disp: , Rfl:  .  fluticasone (FLONASE) 50 MCG/ACT nasal spray, Place 2 sprays into both nostrils daily., Disp: , Rfl:  .  fluticasone (FLOVENT HFA) 110 MCG/ACT inhaler, Inhale 2 puffs into the lungs 2 (two) times daily., Disp: , Rfl:  .  montelukast (SINGULAIR) 10 MG tablet, Take 10 mg by mouth at bedtime., Disp: , Rfl:  .  Multiple Vitamins-Minerals (MULTIVITAMIN WITH MINERALS) tablet, Take 1 tablet by mouth daily., Disp: , Rfl:  .  omeprazole (PRILOSEC) 40 MG capsule, Take 40 mg by mouth daily., Disp: , Rfl:  .  Probiotic Product (PROBIOTIC-10 PO), Take by mouth daily., Disp: , Rfl:  .  sertraline (ZOLOFT) 100 MG tablet, Take 100 mg by mouth daily., Disp: , Rfl:  .  vitamin C (ASCORBIC ACID) 500 MG tablet, Take 500 mg by mouth daily., Disp: , Rfl:  .  HYDROcodone-acetaminophen (NORCO/VICODIN) 5-325 MG  tablet, Take 1 tablet by mouth daily as needed for severe pain., Disp: 30 tablet, Rfl: 0 .  Magnesium Oxide 500 MG CAPS, Take 1 capsule (500 mg total) by mouth 2 (two) times daily at 8 am and 10 pm., Disp: 60 capsule, Rfl: 2  ROS  Constitutional: Denies any fever or chills Gastrointestinal: No reported hemesis, hematochezia, vomiting, or acute GI distress Musculoskeletal: Denies any acute onset joint swelling, redness, loss of ROM, or weakness Neurological: No reported episodes of acute onset apraxia, aphasia, dysarthria, agnosia, amnesia, paralysis, loss of coordination, or loss of consciousness  Allergies  Sandra Strong is allergic to spiriva handihaler [tiotropium bromide monohydrate]; advair diskus [fluticasone-salmeterol]; erythromycin; and sulfa antibiotics.  PFSH  Drug: Sandra Strong  reports that she does not use drugs. Alcohol:  reports that she does not drink alcohol. Tobacco:  reports that she has been smoking. She started smoking about 49 years ago. She has a 129.00 pack-year smoking history. She has never used smokeless tobacco. Medical:  has a past medical history of Anxiety, Arthritis, Asthma, COPD (chronic obstructive pulmonary disease) ([place]), GERD (gastroesophageal reflux disease), and Hypertension. Surgical: Sandra Strong  has a past surgical history that includes Rotator cuff repair (Right); Abdominal hysterectomy; Hernia repair; Foot surgery (Left); and carpel tunnel (Bilateral). Family: family history includes Breast cancer in her paternal aunt; Breast cancer (age of onset: 61) in her mother.  Constitutional Exam  General appearance: Well nourished, well developed, and well hydrated. In no apparent acute distress Vitals:   07/11/18 0920  BP: (!) 158/106  Pulse: 73  Temp: 98 F (36.7 C)  SpO2: 99%  Weight: 202 lb (91.6 kg)  Height: '5\' 2"'$  (1.575 m)   BMI Assessment: Estimated body mass index is 36.95 kg/m as calculated from the following:   Height as of this encounter: '5\' 2"'$   (1.575 m).   Weight as of this encounter: 202 lb (91.6 kg).  BMI interpretation table: BMI level Category Range association with higher incidence of chronic pain  <18 kg/m2 Underweight   18.5-24.9 kg/m2 Ideal body weight   25-29.9 kg/m2 Overweight Increased incidence by 20%  30-34.9 kg/m2 Obese (Class I) Increased incidence by 68%  35-39.9 kg/m2 Severe obesity (Class II) Increased incidence by 136%  >40 kg/m2 Extreme obesity (Class III) Increased incidence by 254%   Patient's current BMI Ideal Body weight  Body mass index is 36.95 kg/m. Ideal body weight: 50.1 kg (110 lb 7.2 oz) Adjusted ideal body weight: 66.7 kg (147 lb 1.1 oz)   BMI Readings from Last 4 Encounters:  07/11/18 36.95 kg/m  05/05/18 36.58 kg/m  04/14/18 36.76 kg/m  03/21/18 36.58 kg/m   Wt Readings from Last 4 Encounters:  07/11/18 202 lb (91.6 kg)  05/05/18 200 lb (90.7 kg)  04/14/18 201 lb (91.2 kg)  03/21/18 200 lb (90.7 kg)  Psych/Mental status: Alert, oriented x 3 (person, place, & time)       Eyes: PERLA Respiratory: No evidence of acute respiratory distress  Cervical Spine Area Exam  Skin & Axial Inspection: No masses, redness, edema, swelling, or associated skin lesions Alignment: Symmetrical Functional ROM: Unrestricted ROM      Stability: No instability detected Muscle Tone/Strength: Functionally intact. No obvious neuro-muscular anomalies detected. Sensory (Neurological): Unimpaired Palpation: No palpable anomalies              Upper Extremity (UE) Exam    Side: Right upper extremity  Side: Left upper extremity  Skin & Extremity Inspection:  Skin color, temperature, and hair growth are WNL. No peripheral edema or cyanosis. No masses, redness, swelling, asymmetry, or associated skin lesions. No contractures.  Skin & Extremity Inspection: Skin color, temperature, and hair growth are WNL. No peripheral edema or cyanosis. No masses, redness, swelling, asymmetry, or associated skin lesions. No  contractures.  Functional ROM: Unrestricted ROM          Functional ROM: Unrestricted ROM          Muscle Tone/Strength: Functionally intact. No obvious neuro-muscular anomalies detected.  Muscle Tone/Strength: Functionally intact. No obvious neuro-muscular anomalies detected.  Sensory (Neurological): Unimpaired          Sensory (Neurological): Unimpaired          Palpation: No palpable anomalies              Palpation: No palpable anomalies              Provocative Test(s):  Phalen's test: deferred Tinel's test: deferred Apley's scratch test (touch opposite shoulder):  Action 1 (Across chest): deferred Action 2 (Overhead): deferred Action 3 (LB reach): deferred   Provocative Test(s):  Phalen's test: deferred Tinel's test: deferred Apley's scratch test (touch opposite shoulder):  Action 1 (Across chest): deferred Action 2 (Overhead): deferred Action 3 (LB reach): deferred    Thoracic Spine Area Exam  Skin & Axial Inspection: No masses, redness, or swelling Alignment: Symmetrical Functional ROM: Unrestricted ROM Stability: No instability detected Muscle Tone/Strength: Functionally intact. No obvious neuro-muscular anomalies detected. Sensory (Neurological): Unimpaired Muscle strength & Tone: No palpable anomalies  Lumbar Spine Area Exam  Skin & Axial Inspection: No masses, redness, or swelling Alignment: Symmetrical Functional ROM: Adequate ROM       Stability: No instability detected Muscle Tone/Strength: Functionally intact. No obvious neuro-muscular anomalies detected. Sensory (Neurological): Unimpaired Palpation: Complains of area being tender to palpation       Provocative Tests: Hyperextension/rotation test: Positive bilaterally for facet joint pain.  Gait & Posture Assessment  Ambulation: Patient ambulates using a cane Gait: Relatively normal for age and body habitus Posture: WNL   Lower Extremity Exam    Side: Right lower extremity  Side: Left lower extremity   Stability: No instability observed          Stability: No instability observed          Skin & Extremity Inspection: Skin color, temperature, and hair growth are WNL. No peripheral edema or cyanosis. No masses, redness, swelling, asymmetry, or associated skin lesions. No contractures.  Skin & Extremity Inspection: Skin color, temperature, and hair growth are WNL. No peripheral edema or cyanosis. No masses, redness, swelling, asymmetry, or associated skin lesions. No contractures.  Functional ROM: Unrestricted ROM                  Functional ROM: Unrestricted ROM                  Muscle Tone/Strength: Functionally intact. No obvious neuro-muscular anomalies detected.  Muscle Tone/Strength: Functionally intact. No obvious neuro-muscular anomalies detected.  Sensory (Neurological): Unimpaired  Sensory (Neurological): Unimpaired  Palpation: No palpable anomalies  Palpation: No palpable anomalies   Assessment  Primary Diagnosis & Pertinent Problem List: The primary encounter diagnosis was Spondylosis without myelopathy or radiculopathy, sacral and sacrococcygeal region. Diagnoses of Coccygodynia, Lumbar spondylosis, Spondylosis without myelopathy or radiculopathy, lumbosacral region, and Chronic pain syndrome were also pertinent to this visit.  Status Diagnosis  Controlled Controlled Persistent 1. Spondylosis without myelopathy or radiculopathy, sacral and sacrococcygeal  region   2. Coccygodynia   3. Lumbar spondylosis   4. Spondylosis without myelopathy or radiculopathy, lumbosacral region   5. Chronic pain syndrome     Problems updated and reviewed during this visit: Problem  Lumbar Spondylosis   Plan of Care  Pharmacotherapy (Medications Ordered): No orders of the defined types were placed in this encounter.  New Prescriptions   No medications on file   Medications administered today: Sandra Strong had no medications administered during this visit. Lab-work, procedure(s), and/or  referral(s): Orders Placed This Encounter  Procedures  . LUMBAR FACET(MEDIAL BRANCH NERVE BLOCK) MBNB  . Ambulatory referral to Physical Therapy   Imaging and/or referral(s): AMB REFERRAL TO PHYSICAL THERAPY  Interventional management options: Planned, scheduled, and/or pending:   Diagnostic bilateral lumbar facet block #1under fluoroscopic guidance and IV sedation   Considering:   Diagnostic bilateral lumbar facet nerve block Possible bilateral lumbar facet RFA Diagnostic bilateral sacroiliac joint block Possible bilateral sacroiliac joint RFA Diagnosticbilateral intra-articular knee injection Diagnostic bilateral Hyalgan series Diagnostic bilateral genicular Nerve block Possible bilateral genicular RFA Diagnostic bilateral ankle injections   Palliative PRN treatment(s):   None at this time    Provider-requested follow-up: Return for Procedure(w/Sedation), w/ Dr. Dossie Arbour.  No future appointments. Primary Care Physician: Donnie Coffin, MD Location: [facility] Pain Management Facility Note by: Vevelyn Francois NP Date: 07/11/2018; Time: 10:23 AM  Pain Score Disclaimer: We use the NRS-11 scale. This is a self-reported, subjective measurement of pain severity with only modest accuracy. It is used primarily to identify changes within a particular patient. It must be understood that outpatient pain scales are significantly less accurate that those used for research, where they can be applied under ideal controlled circumstances with minimal exposure to variables. In reality, the score is likely to be a combination of pain intensity and pain affect, where pain affect describes the degree of emotional arousal or changes in action readiness caused by the sensory experience of pain. Factors such as social and work situation, setting, emotional state, anxiety levels, expectation, and prior pain experience may influence pain perception and show large inter-individual  differences that may also be affected by time variables.  Patient instructions provided during this appointment: Patient Instructions  ____________________________________________________________________________________________  Medication Rules  Applies to: All patients receiving prescriptions (written or electronic).  Pharmacy of record: Pharmacy where electronic prescriptions will be sent. If written prescriptions are taken to a different pharmacy, please inform the nursing staff. The pharmacy listed in the electronic medical record should be the one where you would like electronic prescriptions to be sent.  Prescription refills: Only during scheduled appointments. Applies to both, written and electronic prescriptions.  NOTE: The following applies primarily to controlled substances (Opioid* Pain Medications).   Patient's responsibilities: 1. Pain Pills: Bring all pain pills to every appointment (except for procedure appointments). 2. Pill Bottles: Bring pills in original pharmacy bottle. Always bring newest bottle. Bring bottle, even if empty. 3. Medication refills: You are responsible for knowing and keeping track of what medications you need refilled. The day before your appointment, write a list of all prescriptions that need to be refilled. Bring that list to your appointment and give it to the admitting nurse. Prescriptions will be written only during appointments. If you forget a medication, it will not be "Called in", "Faxed", or "electronically sent". You will need to get another appointment to get these prescribed. 4. Prescription Accuracy: You are responsible for carefully inspecting your prescriptions before leaving our  office. Have the discharge nurse carefully go over each prescription with you, before taking them home. Make sure that your name is accurately spelled, that your address is correct. Check the name and dose of your medication to make sure it is accurate. Check the  number of pills, and the written instructions to make sure they are clear and accurate. Make sure that you are given enough medication to last until your next medication refill appointment. 5. Taking Medication: Take medication as prescribed. Never take more pills than instructed. Never take medication more frequently than prescribed. Taking less pills or less frequently is permitted and encouraged, when it comes to controlled substances (written prescriptions).  6. Inform other Doctors: Always inform, all of your healthcare providers, of all the medications you take. 7. Pain Medication from other Providers: You are not allowed to accept any additional pain medication from any other Doctor or Healthcare provider. There are two exceptions to this rule. (see below) In the event that you require additional pain medication, you are responsible for notifying us, as stated below. 8. Medication Agreement: You are responsible for carefully reading and following our Medication Agreement. This must be signed before receiving any prescriptions from our practice. Safely store a copy of your signed Agreement. Violations to the Agreement will result in no further prescriptions. (Additional copies of our Medication Agreement are available upon request.) 9. Laws, Rules, & Regulations: All patients are expected to follow all Federal and [employer], Rules, [employer]. Ignorance of the Laws does not constitute a valid excuse. The use of any illegal substances is prohibited. 10. Adopted CDC guidelines & recommendations: Target dosing levels will be at or below 60 MME/day. Use of benzodiazepines** is not recommended.  Exceptions: There are only two exceptions to the rule of not receiving pain medications from other Healthcare Providers. 1. Exception #1 (Emergencies): In the event of an emergency (i.e.: accident requiring emergency care), you are allowed to receive additional pain medication. However, you are  responsible for: As soon as you are able, call our office [phone], at any time of the day or night, and leave a message stating your name, the date and nature of the emergency, and the name and dose of the medication prescribed. In the event that your call is answered by a member of our staff, make sure to document and save the date, time, and the name of the person that took your information.  2. Exception #2 (Planned Surgery): In the event that you are scheduled by another doctor or dentist to have any type of surgery or procedure, you are allowed (for a period no longer than 30 days), to receive additional pain medication, for the acute post-op pain. However, in this case, you are responsible for picking up a copy of our "Post-op Pain Management for Surgeons" handout, and giving it to your surgeon or dentist. This document is available at our office, and does not require an appointment to obtain it. Simply go to our office during business hours (Monday-Thursday from 8:00 AM to 4:00 PM) (Friday 8:00 AM to 12:00 Noon) or if you have a scheduled appointment with [place], prior to your surgery, and ask for it by name. In addition, you will need to provide [place] with your name, name of your surgeon, type of surgery, and date of procedure or surgery.  *Opioid medications include: morphine, codeine, oxycodone, oxymorphone, hydrocodone, hydromorphone, meperidine, tramadol, tapentadol, buprenorphine, fentanyl, methadone. **Benzodiazepine medications include: diazepam (Valium), alprazolam (Xanax), clonazepam (Klonopine), lorazepam (  Ativan), clorazepate (Tranxene), chlordiazepoxide (Librium), estazolam (Prosom), oxazepam (Serax), temazepam (Restoril), triazolam (Halcion) (Last updated: 01/27/2018) ____________________________________________________________________________________________

## 2018-07-11 NOTE — Patient Instructions (Signed)
____________________________________________________________________________________________  Medication Rules  Applies to: All patients receiving prescriptions (written or electronic).  Pharmacy of record: Pharmacy where electronic prescriptions will be sent. If written prescriptions are taken to a different pharmacy, please inform the nursing staff. The pharmacy listed in the electronic medical record should be the one where you would like electronic prescriptions to be sent.  Prescription refills: Only during scheduled appointments. Applies to both, written and electronic prescriptions.  NOTE: The following applies primarily to controlled substances (Opioid* Pain Medications).   Patient's responsibilities: 1. Pain Pills: Bring all pain pills to every appointment (except for procedure appointments). 2. Pill Bottles: Bring pills in original pharmacy bottle. Always bring newest bottle. Bring bottle, even if empty. 3. Medication refills: You are responsible for knowing and keeping track of what medications you need refilled. The day before your appointment, write a list of all prescriptions that need to be refilled. Bring that list to your appointment and give it to the admitting nurse. Prescriptions will be written only during appointments. If you forget a medication, it will not be "Called in", "Faxed", or "electronically sent". You will need to get another appointment to get these prescribed. 4. Prescription Accuracy: You are responsible for carefully inspecting your prescriptions before leaving our office. Have the discharge nurse carefully go over each prescription with you, before taking them home. Make sure that your name is accurately spelled, that your address is correct. Check the name and dose of your medication to make sure it is accurate. Check the number of pills, and the written instructions to make sure they are clear and accurate. Make sure that you are given enough medication to last  until your next medication refill appointment. 5. Taking Medication: Take medication as prescribed. Never take more pills than instructed. Never take medication more frequently than prescribed. Taking less pills or less frequently is permitted and encouraged, when it comes to controlled substances (written prescriptions).  6. Inform other Doctors: Always inform, all of your healthcare providers, of all the medications you take. 7. Pain Medication from other Providers: You are not allowed to accept any additional pain medication from any other Doctor or Healthcare provider. There are two exceptions to this rule. (see below) In the event that you require additional pain medication, you are responsible for notifying us, as stated below. 8. Medication Agreement: You are responsible for carefully reading and following our Medication Agreement. This must be signed before receiving any prescriptions from our practice. Safely store a copy of your signed Agreement. Violations to the Agreement will result in no further prescriptions. (Additional copies of our Medication Agreement are available upon request.) 9. Laws, Rules, & Regulations: All patients are expected to follow all Federal and State Laws, Statutes, Rules, & Regulations. Ignorance of the Laws does not constitute a valid excuse. The use of any illegal substances is prohibited. 10. Adopted CDC guidelines & recommendations: Target dosing levels will be at or below 60 MME/day. Use of benzodiazepines** is not recommended.  Exceptions: There are only two exceptions to the rule of not receiving pain medications from other Healthcare Providers. 1. Exception #1 (Emergencies): In the event of an emergency (i.e.: accident requiring emergency care), you are allowed to receive additional pain medication. However, you are responsible for: As soon as you are able, call our office [phone], at any time of the day or night, and leave a message stating your name, the  date and nature of the emergency, and the name and dose of the medication   prescribed. In the event that your call is answered by a member of our staff, make sure to document and save the date, time, and the name of the person that took your information.  2. Exception #2 (Planned Surgery): In the event that you are scheduled by another doctor or dentist to have any type of surgery or procedure, you are allowed (for a period no longer than 30 days), to receive additional pain medication, for the acute post-op pain. However, in this case, you are responsible for picking up a copy of our "Post-op Pain Management for Surgeons" handout, and giving it to your surgeon or dentist. This document is available at our office, and does not require an appointment to obtain it. Simply go to our office during business hours (Monday-Thursday from 8:00 AM to 4:00 PM) (Friday 8:00 AM to 12:00 Noon) or if you have a scheduled appointment with us, prior to your surgery, and ask for it by name. In addition, you will need to provide us with your name, name of your surgeon, type of surgery, and date of procedure or surgery.  *Opioid medications include: morphine, codeine, oxycodone, oxymorphone, hydrocodone, hydromorphone, meperidine, tramadol, tapentadol, buprenorphine, fentanyl, methadone. **Benzodiazepine medications include: diazepam (Valium), alprazolam (Xanax), clonazepam (Klonopine), lorazepam (Ativan), clorazepate (Tranxene), chlordiazepoxide (Librium), estazolam (Prosom), oxazepam (Serax), temazepam (Restoril), triazolam (Halcion) (Last updated: 01/27/2018) ____________________________________________________________________________________________    

## 2018-07-12 ENCOUNTER — Encounter: Payer: Self-pay | Admitting: Internal Medicine

## 2018-07-12 ENCOUNTER — Ambulatory Visit (INDEPENDENT_AMBULATORY_CARE_PROVIDER_SITE_OTHER): Payer: 59 | Admitting: Internal Medicine

## 2018-07-12 VITALS — BP 120/76 | HR 75 | Ht 62.0 in | Wt 203.0 lb

## 2018-07-12 DIAGNOSIS — Z716 Tobacco abuse counseling: Secondary | ICD-10-CM | POA: Diagnosis not present

## 2018-07-12 DIAGNOSIS — J441 Chronic obstructive pulmonary disease with (acute) exacerbation: Secondary | ICD-10-CM | POA: Diagnosis not present

## 2018-07-12 MED ORDER — SALINE SPRAY 0.65 % NA SOLN: 1.0000 | NASAL | 12 refills | Status: DC | PRN

## 2018-07-12 MED ORDER — PREDNISONE 20 MG PO TABS: 20.0000 mg | ORAL_TABLET | Freq: Every day | ORAL | 0 refills | Status: DC

## 2018-07-12 MED ORDER — LEVOFLOXACIN 500 MG PO TABS: 500.0000 mg | ORAL_TABLET | Freq: Every day | ORAL | 0 refills | Status: AC

## 2018-07-12 NOTE — Patient Instructions (Signed)
Prednisone 20 mg daily for 7 days Levaquin 500 mg twice daily for 10 days Saline washes  Continue inhalers as prescribed

## 2018-07-12 NOTE — Progress Notes (Signed)
Name: Sandra BorrowSuzette L Strong MRN: [id number] DOB: 02/20/1956     CONSULTATION DATE: 5.16.19 REFERRING MD : Graciella BeltonAtcock  CHIEF COMPLAINT: SOB  STUDIES:         1.29.19 CT chest Independently reviewed by Me No acute findings No masses, pneumonia No effusions 3.7 mm in the left lower lobe.   HISTORY OF PRESENT ILLNESS:  62 yo white female with SOB Chronic SOB and DOE for many years +intermittent wheezing and cough for many years has had childhood ASTHMA and allergies Has extensive smoking history 128 pack year   Presents today for COPD exacerbation with wheezing Still smokes  She is trying to cut down smoking bu she is not willing to stop at this time  She has chronic allergic rhinitis and takes daily flonase and zyrtec and benadryl She wanst sal;ine washes as well She is NOT in any acute distress but just feels very bad  + signs of infection at this time No signs of heart failure at this time +acute COPD or asthma exacerbation at this time   Smoking Assessment and Cessation Counseling   Upon further questioning, Patient smokes 1/2 ppd  I have advised patient to quit/stop smoking as soon as possible due to high risk for multiple medical problems  Patient s NOT willing to quit smoking at this time  I have advised patient that we can assist and have options of Nicotine replacement therapy. I also advised patient on behavioral therapy and can provide oral medication therapy in conjunction with the other therapies  Follow up next Office visit  for assessment of smoking cessation  Smoking cessation counseling advised for 4 minutes    REVIEW OF SYSTEMS:   Constitutional: Negative for fever, chills, weight loss, +malaise/fatigue and diaphoresis.  HENT: Negative for hearing loss, ear pain, nosebleeds, congestion, sore throat, neck pain, tinnitus and ear discharge.   Eyes: Negative for blurred vision, double vision, photophobia, pain, discharge and redness.  Respiratory:  intermittent shortness of breath, wheezing +chest congestion Cardiovascular: Negative for chest pain, palpitations, orthopnea, claudication, leg swelling and PND.  ALL OTHER ROS ARE NEGATIVE   Ht 5\' 2"  (1.575 m)   BMI 36.95 kg/m   Physical Examination:   GENERAL:NAD, no fevers, chills, no weakness no fatigue HEAD: Normocephalic, atraumatic.  EYES: Pupils equal, round, reactive to light. Extraocular muscles intact. No scleral icterus.  MOUTH: Moist mucosal membrane.   EAR, NOSE, THROAT: Clear without exudates. No external lesions.  NECK: Supple. No thyromegaly. No nodules. No JVD.  PULMONARY:CTA B/L +wheezes, no crackles, no rhonchi CARDIOVASCULAR: S1 and S2. Regular rate and rhythm. No murmurs, rubs, or gallops. No edema.  GASTROINTESTINAL: Soft, nontender, nondistended. No masses. Positive bowel sounds.  MUSCULOSKELETAL: No swelling, clubbing, or edema. Range of motion full in all extremities.  NEUROLOGIC: Cranial nerves II through XII are intact. No gross focal neurological deficits.  SKIN: No ulceration, lesions, rashes, or cyanosis. Skin warm and dry. Turgor intact.  PSYCHIATRIC: Mood, affect within normal limits. The patient is awake, alert and oriented x 3. Insight, judgment intact.      ASSESSMENT / PLAN: 62 year old pleasant white female seen today for long-standing smoking history in the setting of chronic allergic rhinitis with history of asthma and COPD with ongoing tobacco abuse  At this time I have spent significant amount of time with patient regarding smoking cessation and the risk factors associated with tobacco abuse  Patient now has acute COPD exacerbation  At this time she is not willing to quit,  best she can do is try to cut down  Her office spirometry shows FEV1 of 1.7 with 72% predicted with a ratio 79% predicted with a reduced FEF 2575 1.75 L at 78% predicted this is a mild form of COPD  1 mild ACUTE COPD exacerbation -prednisone 20 mg daily for 7  days Levaquin 500 mg BID for 10 days NEB therapy as needed every 4 hrs  2.Allergic rhinitis continue Zyrtec Flonase Benadryl and Singulair as prescribed  Saline spray as needed  3.Smoking cessation strongly advised 4.  3.7 mm nodule in the left lower lobe follow-up CT scan in 1 year   Patient satisfied with Plan of action and management. All questions answered Follow up in 3 months  Tashi Andujo Santiago Gladavid Claris Guymon, M.D.  [facility]  Medical Director [facility] Medical Director [facility]

## 2018-07-19 ENCOUNTER — Telehealth: Payer: Self-pay | Admitting: *Deleted

## 2018-07-27 NOTE — Patient Instructions (Addendum)
____________________________________________________________________________________________  Post-Procedure Discharge Instructions  Instructions:  Apply ice: Fill a plastic sandwich bag with crushed ice. Cover it with a small towel and apply to injection site. Apply for 15 minutes then remove x 15 minutes. Repeat sequence on day of procedure, until you go to bed. The purpose is to minimize swelling and discomfort after procedure.  Apply heat: Apply heat to procedure site starting the day following the procedure. The purpose is to treat any soreness and discomfort from the procedure.  Food intake: Start with clear liquids (like water) and advance to regular food, as tolerated.   Physical activities: Keep activities to a minimum for the first 8 hours after the procedure.   Driving: If you have received any sedation, you are not allowed to drive for 24 hours after your procedure.  Blood thinner: Restart your blood thinner 6 hours after your procedure. (Only for those taking blood thinners)  Insulin: As soon as you can eat, you may resume your normal dosing schedule. (Only for those taking insulin)  Infection prevention: Keep procedure site clean and dry.  Post-procedure Pain Diary: Extremely important that this be done correctly and accurately. Recorded information will be used to determine the next step in treatment.  Pain evaluated is that of treated area only. Do not include pain from an untreated area.  Complete every hour, on the hour, for the initial 8 hours. Set an alarm to help you do this part accurately.  Do not go to sleep and have it completed later. It will not be accurate.  Follow-up appointment: Keep your follow-up appointment after the procedure. Usually 2 weeks for most procedures. (6 weeks in the case of radiofrequency.) Bring you pain diary.   Expect:  From numbing medicine (AKA: Local Anesthetics): Numbness or decrease in pain.  Onset: Full effect within 15  minutes of injected.  Duration: It will depend on the type of local anesthetic used. On the average, 1 to 8 hours.   From steroids: Decrease in swelling or inflammation. Once inflammation is improved, relief of the pain will follow.  Onset of benefits: Depends on the amount of swelling present. The more swelling, the longer it will take for the benefits to be seen. In some cases, up to 10 days.  Duration: Steroids will stay in the system x 2 weeks. Duration of benefits will depend on multiple posibilities including persistent irritating factors.  From procedure: Some discomfort is to be expected once the numbing medicine wears off. This should be minimal if ice and heat are applied as instructed.  Call if:  You experience numbness and weakness that gets worse with time, as opposed to wearing off.  New onset bowel or bladder incontinence. (This applies to Spinal procedures only)  Emergency Numbers:  Durning business hours (Monday - Thursday, 8:00 AM - 4:00 PM) (Friday, 9:00 AM - 12:00 Noon): [phone]  After hours: [phone] ____________________________________________________________________________________________   Pain Management Discharge Instructions  General Discharge Instructions :  If you need to reach your doctor call: Monday-Friday 8:00 am - 4:00 pm at [phone] or toll free [phone].  After clinic hours [phone] to have operator reach doctor.  Bring all of your medication bottles to all your appointments in the pain clinic.  To cancel or reschedule your appointment with Pain Management please remember to call 24 hours in advance to avoid a fee.  Refer to the educational materials which you have been given on: General Risks, I had my Procedure. Discharge Instructions, Post Sedation.    Post Procedure Instructions:  The drugs you were given will stay in your system until tomorrow, so for the next 24 hours you should not drive, make any legal  decisions or drink any alcoholic beverages.  You may eat anything you prefer, but it is better to start with liquids then soups and crackers, and gradually work up to solid foods.  Please notify your doctor immediately if you have any unusual bleeding, trouble breathing or pain that is not related to your normal pain.  Depending on the type of procedure that was done, some parts of your body may feel week and/or numb.  This usually clears up by tonight or the next day.  Walk with the use of an assistive device or accompanied by an adult for the 24 hours.  You may use ice on the affected area for the first 24 hours.  Put ice in a Ziploc bag and cover with a towel and place against area 15 minutes on 15 minutes off.  You may switch to heat after 24 hours.Facet Blocks Patient Information  Description: The facets are joints in the spine between the vertebrae.  Like any joints in the body, facets can become irritated and painful.  Arthritis can also effect the facets.  By injecting steroids and local anesthetic in and around these joints, we can temporarily block the nerve supply to them.  Steroids act directly on irritated nerves and tissues to reduce selling and inflammation which often leads to decreased pain.  Facet blocks may be done anywhere along the spine from the neck to the low back depending upon the location of your pain.   After numbing the skin with local anesthetic (like Novocaine), a small needle is passed onto the facet joints under x-ray guidance.  You may experience a sensation of pressure while this is being done.  The entire block usually lasts about 15-25 minutes.   Conditions which may be treated by facet blocks:   Low back/buttock pain  Neck/shoulder pain  Certain types of headaches  Preparation for the injection:  1. Do not eat any solid food or dairy products within 8 hours of your appointment. 2. You may drink clear liquid up to 3 hours before appointment.  Clear  liquids include water, black coffee, juice or soda.  No milk or cream please. 3. You may take your regular medication, including pain medications, with a sip of water before your appointment.  Diabetics should hold regular insulin (if taken separately) and take 1/2 normal NPH dose the morning of the procedure.  Carry some sugar containing items with you to your appointment. 4. A driver must accompany you and be prepared to drive you home after your procedure. 5. Bring all your current medications with you. 6. An IV may be inserted and sedation may be given at the discretion of the physician. 7. A blood pressure cuff, EKG and other monitors will often be applied during the procedure.  Some patients may need to have extra oxygen administered for a short period. 8. You will be asked to provide medical information, including your allergies and medications, prior to the procedure.  We must know immediately if you are taking blood thinners (like Coumadin/Warfarin) or if you are allergic to IV iodine contrast (dye).  We must know if you could possible be pregnant.  Possible side-effects:   Bleeding from needle site  Infection (rare, may require surgery)  Nerve injury (rare)  Numbness & tingling (temporary)  Difficulty urinating (rare, temporary)    Spinal headache (a headache worse with upright posture)  Light-headedness (temporary)  Pain at injection site (serveral days)  Decreased blood pressure (rare, temporary)  Weakness in arm/leg (temporary)  Pressure sensation in back/neck (temporary)   Call if you experience:   Fever/chills associated with headache or increased back/neck pain  Headache worsened by an upright position  New onset, weakness or numbness of an extremity below the injection site  Hives or difficulty breathing (go to the emergency room)  Inflammation or drainage at the injection site(s)  Severe back/neck pain greater than usual  New symptoms which are  concerning to you  Please note:  Although the local anesthetic injected can often make your back or neck feel good for several hours after the injection, the pain will likely return. It takes 3-7 days for steroids to work.  You may not notice any pain relief for at least one week.  If effective, we will often do a series of 2-3 injections spaced 3-6 weeks apart to maximally decrease your pain.  After the initial series, you may be a candidate for a more permanent nerve block of the facets.  If you have any questions, please call [phone] [facility] 

## 2018-07-27 NOTE — Progress Notes (Addendum)
Patient's Name: Sandra Strong  MRN: [id number]  Referring Provider: Barbette Merino, NP  DOB: Apr 13, 1956  PCP: Emogene Morgan, MD  DOS: 07/28/2018  Note by: Oswaldo Done, MD  Service setting: Ambulatory outpatient  Specialty: Interventional Pain Management  Patient type: Established  Location: [facility] (AMB) Pain Management Facility  Visit type: Interventional Procedure   Primary Reason for Visit: Interventional Pain Management Treatment. CC: Back Pain  Procedure:          Anesthesia, Analgesia, Anxiolysis:  Type: Lumbar Facet, Medial Branch Block(s) #1  Primary Purpose: Diagnostic Region: Posterolateral Lumbosacral Spine Level: L2, L3, L4, L5, & S1 Medial Branch Level(s). Injecting these levels blocks the L3-4, L4-5, and L5-S1 lumbar facet joints. Laterality: Bilateral  Type: Local Anesthesia Indication(s): Analgesia         Route: Infiltration ([place]/IM) IV Access: Declined Sedation: Declined  Local Anesthetic: Lidocaine 1-2%   Indications: 1. Spondylosis without myelopathy or radiculopathy, lumbosacral region   2. Lumbar facet arthropathy   3. Lumbar facet syndrome (Bilateral) (R>L)   4. Chronic low back pain (Primary Area of Pain) (Bilateral) (R>L)    Pain Score: Pre-procedure: 2 /10 Post-procedure: 0-No pain/10  Pre-op Assessment:  Sandra Strong is a 62 y.o. (year old), female patient, seen today for interventional treatment. She  has a past surgical history that includes Rotator cuff repair (Right); Abdominal hysterectomy; Hernia repair; Foot surgery (Left); and carpel tunnel (Bilateral). Sandra Strong has a current medication list which includes the following prescription(s): acetaminophen, albuterol, albuterol, alendronate, calcium carbonate-simethicone, cetirizine, vitamin d3, diphenhydramine, etodolac, fluticasone, fluticasone, montelukast, multivitamin with minerals, omeprazole, probiotic product, sertraline, sodium chloride, vitamin c, and prednisone. Her primarily concern today is  the Back Pain  Initial Vital Signs:  Pulse/HCG Rate: 90ECG Heart Rate: 90 Temp: 98.2 F (36.8 C) Resp: (!) 21 BP: (!) 144/98 SpO2: 98 %  BMI: Estimated body mass index is 37.13 kg/m as calculated from the following:   Height as of this encounter: 5\' 2"  (1.575 m).   Weight as of this encounter: 203 lb (92.1 kg).  Risk Assessment: Allergies: Reviewed. She is allergic to spiriva handihaler [tiotropium bromide monohydrate]; advair diskus [fluticasone-salmeterol]; erythromycin; and sulfa antibiotics.  Allergy Precautions: None required Coagulopathies: Reviewed. None identified.  Blood-thinner therapy: None at this time Active Infection(s): Reviewed. None identified. Sandra Strong is afebrile  Site Confirmation: Sandra Strong was asked to confirm the procedure and laterality before marking the site Procedure checklist: Completed Consent: Before the procedure and under the influence of no sedative(s), amnesic(s), or anxiolytics, the patient was informed of the treatment options, risks and possible complications. To fulfill our ethical and legal obligations, as recommended by the American Medical Association's Code of Ethics, I have informed the patient of my clinical impression; the nature and purpose of the treatment or procedure; the risks, benefits, and possible complications of the intervention; the alternatives, including doing nothing; the risk(s) and benefit(s) of the alternative treatment(s) or procedure(s); and the risk(s) and benefit(s) of doing nothing. The patient was provided information about the general risks and possible complications associated with the procedure. These may include, but are not limited to: failure to achieve desired goals, infection, bleeding, organ or nerve damage, allergic reactions, paralysis, and death. In addition, the patient was informed of those risks and complications associated to Spine-related procedures, such as failure to decrease pain; infection (i.e.:  Meningitis, epidural or intraspinal abscess); bleeding (i.e.: epidural hematoma, subarachnoid hemorrhage, or any other type of intraspinal or peri-dural bleeding); organ or  nerve damage (i.e.: Any type of peripheral nerve, nerve root, or spinal cord injury) with subsequent damage to sensory, motor, and/or autonomic systems, resulting in permanent pain, numbness, and/or weakness of one or several areas of the body; allergic reactions; (i.e.: anaphylactic reaction); and/or death. Furthermore, the patient was informed of those risks and complications associated with the medications. These include, but are not limited to: allergic reactions (i.e.: anaphylactic or anaphylactoid reaction(s)); adrenal axis suppression; blood sugar elevation that in diabetics may result in ketoacidosis or comma; water retention that in patients with history of congestive heart failure may result in shortness of breath, pulmonary edema, and decompensation with resultant heart failure; weight gain; swelling or edema; medication-induced neural toxicity; particulate matter embolism and blood vessel occlusion with resultant organ, and/or nervous system infarction; and/or aseptic necrosis of one or more joints. Finally, the patient was informed that Medicine is not an exact science; therefore, there is also the possibility of unforeseen or unpredictable risks and/or possible complications that may result in a catastrophic outcome. The patient indicated having understood very clearly. We have given the patient no guarantees and we have made no promises. Enough time was given to the patient to ask questions, all of which were answered to the patient's satisfaction. Sandra Strong has indicated that she wanted to continue with the procedure. Attestation: I, the ordering provider, attest that I have discussed with the patient the benefits, risks, side-effects, alternatives, likelihood of achieving goals, and potential problems during recovery for the  procedure that I have provided informed consent. Date  Time: 07/28/2018  8:05 AM  Pre-Procedure Preparation:  Monitoring: As per clinic protocol. Respiration, ETCO2, SpO2, BP, heart rate and rhythm monitor placed and checked for adequate function Safety Precautions: Patient was assessed for positional comfort and pressure points before starting the procedure. Time-out: I initiated and conducted the "Time-out" before starting the procedure, as per protocol. The patient was asked to participate by confirming the accuracy of the "Time Out" information. Verification of the correct person, site, and procedure were performed and confirmed by me, the nursing staff, and the patient. "Time-out" conducted as per Joint Commission's Universal Protocol (UP.01.01.01). Time: 0830  Description of Procedure:          Position: Prone Laterality: Bilateral. The procedure was performed in identical fashion on both sides. Levels:  L2, L3, L4, L5, & S1 Medial Branch Level(s) Area Prepped: Posterior Lumbosacral Region Prepping solution: ChloraPrep (2% chlorhexidine gluconate and 70% isopropyl alcohol) Safety Precautions: Aspiration looking for blood return was conducted prior to all injections. At no point did we inject any substances, as a needle was being advanced. Before injecting, the patient was told to immediately notify me if she was experiencing any new onset of "ringing in the ears, or metallic taste in the mouth". No attempts were made at seeking any paresthesias. Safe injection practices and needle disposal techniques used. Medications properly checked for expiration dates. SDV (single dose vial) medications used. After the completion of the procedure, all disposable equipment used was discarded in the proper designated medical waste containers. Local Anesthesia: Protocol guidelines were followed. The patient was positioned over the fluoroscopy table. The area was prepped in the usual manner. The time-out was  completed. The target area was identified using fluoroscopy. A 12-in long, straight, sterile hemostat was used with fluoroscopic guidance to locate the targets for each level blocked. Once located, the skin was marked with an approved surgical skin marker. Once all sites were marked, the skin (epidermis, dermis, and  hypodermis), as well as deeper tissues (fat, connective tissue and muscle) were infiltrated with a small amount of a short-acting local anesthetic, loaded on a 10cc syringe with a 25G, 1.5-in  Needle. An appropriate amount of time was allowed for local anesthetics to take effect before proceeding to the next step. Local Anesthetic: Lidocaine 2.0% The unused portion of the local anesthetic was discarded in the proper designated containers. Technical explanation of process:  L2 Medial Branch Nerve Block (MBB): The target area for the L2 medial branch is at the junction of the postero-lateral aspect of the superior articular process and the superior, posterior, and medial edge of the transverse process of L3. Under fluoroscopic guidance, a Quincke needle was inserted until contact was made with os over the superior postero-lateral aspect of the pedicular shadow (target area). After negative aspiration for blood, 0.5 mL of the nerve block solution was injected without difficulty or complication. The needle was removed intact. L3 Medial Branch Nerve Block (MBB): The target area for the L3 medial branch is at the junction of the postero-lateral aspect of the superior articular process and the superior, posterior, and medial edge of the transverse process of L4. Under fluoroscopic guidance, a Quincke needle was inserted until contact was made with os over the superior postero-lateral aspect of the pedicular shadow (target area). After negative aspiration for blood, 0.5 mL of the nerve block solution was injected without difficulty or complication. The needle was removed intact. L4 Medial Branch Nerve Block  (MBB): The target area for the L4 medial branch is at the junction of the postero-lateral aspect of the superior articular process and the superior, posterior, and medial edge of the transverse process of L5. Under fluoroscopic guidance, a Quincke needle was inserted until contact was made with os over the superior postero-lateral aspect of the pedicular shadow (target area). After negative aspiration for blood, 0.5 mL of the nerve block solution was injected without difficulty or complication. The needle was removed intact. L5 Medial Branch Nerve Block (MBB): The target area for the L5 medial branch is at the junction of the postero-lateral aspect of the superior articular process and the superior, posterior, and medial edge of the sacral ala. Under fluoroscopic guidance, a Quincke needle was inserted until contact was made with os over the superior postero-lateral aspect of the pedicular shadow (target area). After negative aspiration for blood, 0.5 mL of the nerve block solution was injected without difficulty or complication. The needle was removed intact. S1 Medial Branch Nerve Block (MBB): The target area for the S1 medial branch is at the posterior and inferior 6 o'clock position of the L5-S1 facet joint. Under fluoroscopic guidance, the Quincke needle inserted for the L5 MBB was redirected until contact was made with os over the inferior and postero aspect of the sacrum, at the 6 o' clock position under the L5-S1 facet joint (Target area). After negative aspiration for blood, 0.5 mL of the nerve block solution was injected without difficulty or complication. The needle was removed intact. Procedural Needles: 25-gauge, 3.5-inch, Quincke needles used for all levels. Nerve block solution: 0.2% PF-Ropivacaine + Triamcinolone (40 mg/mL) diluted to a final concentration of 4 mg of Triamcinolone/mL of Ropivacaine The unused portion of the solution was discarded in the proper designated containers.  Once the  entire procedure was completed, the treated area was cleaned, making sure to leave some of the prepping solution back to take advantage of its long term bactericidal properties.   Illustration of the posterior  view of the lumbar spine and the posterior neural structures. Laminae of L2 through S1 are labeled. DPRL5, dorsal primary ramus of L5; DPRS1, dorsal primary ramus of S1; DPR3, dorsal primary ramus of L3; FJ, facet (zygapophyseal) joint L3-L4; I, inferior articular process of L4; LB1, lateral branch of dorsal primary ramus of L1; IAB, inferior articular branches from L3 medial branch (supplies L4-L5 facet joint); IBP, intermediate branch plexus; MB3, medial branch of dorsal primary ramus of L3; NR3, third lumbar nerve root; S, superior articular process of L5; SAB, superior articular branches from L4 (supplies L4-5 facet joint also); TP3, transverse process of L3.  Vitals:   07/28/18 0840 07/28/18 0852 07/28/18 0903 07/28/18 0906  BP: (!) 157/100 (!) 150/100 (!) 149/103 (!) 142/85  Pulse:      Resp: (!) 21 20 18    Temp:   98.9 F (37.2 C)   SpO2: 95% 96% 98%   Weight:      Height:        Start Time: 0830 hrs. End Time: 0849 hrs.  Imaging Guidance (Spinal):          Type of Imaging Technique: Fluoroscopy Guidance (Spinal) Indication(s): Assistance in needle guidance and placement for procedures requiring needle placement in or near specific anatomical locations not easily accessible without such assistance. Exposure Time: Please see nurses notes. Contrast: None used. Fluoroscopic Guidance: I was personally present during the use of fluoroscopy. "Tunnel Vision Technique" used to obtain the best possible view of the target area. Parallax error corrected before commencing the procedure. "Direction-depth-direction" technique used to introduce the needle under continuous pulsed fluoroscopy. Once target was reached, antero-posterior, oblique, and lateral fluoroscopic projection used confirm  needle placement in all planes. Images permanently stored in EMR. Interpretation: No contrast injected. I personally interpreted the imaging intraoperatively. Adequate needle placement confirmed in multiple planes. Permanent images saved into the patient's record.  Antibiotic Prophylaxis:   Anti-infectives (From admission, onward)   None     Indication(s): None identified  Post-operative Assessment:  Post-procedure Vital Signs:  Pulse/HCG Rate: 9076 Temp: 98.9 F (37.2 C) Resp: 18 BP: (!) 142/85(changed cuff size.  ) SpO2: 98 %  EBL: None  Complications: No immediate post-treatment complications observed by team, or reported by patient.  Note: The patient tolerated the entire procedure well. A repeat set of vitals were taken after the procedure and the patient was kept under observation following institutional policy, for this type of procedure. Post-procedural neurological assessment was performed, showing return to baseline, prior to discharge. The patient was provided with post-procedure discharge instructions, including a section on how to identify potential problems. Should any problems arise concerning this procedure, the patient was given instructions to immediately contact us, at any time, without hesitation. In any case, we plan to contact the patient by telephone for a follow-up status report regarding this interventional procedure.  Comments:  No additional relevant information.  Plan of Care    Imaging Orders     DG C-Arm 1-60 Min-No Report  Procedure Orders     LUMBAR FACET(MEDIAL BRANCH NERVE BLOCK) MBNB  Medications ordered for procedure: Meds ordered this encounter  Medications  . lidocaine (XYLOCAINE) 2 % (with pres) injection 400 mg  . DISCONTD: midazolam (VERSED) 5 MG/5ML injection 1-2 mg    Make sure Flumazenil is available in the pyxis when using this medication. If oversedation occurs, administer 0.2 mg IV over 15 sec. If after 45 sec no response,  administer 0.2 mg again over 1 min; may  repeat at 1 min intervals; not to exceed 4 doses (1 mg)  . DISCONTD: fentaNYL (SUBLIMAZE) injection 25-50 mcg    Make sure Narcan is available in the pyxis when using this medication. In the event of respiratory depression (RR< 8/min): Titrate NARCAN (naloxone) in increments of 0.1 to 0.2 mg IV at 2-3 minute intervals, until desired degree of reversal.  . DISCONTD: lactated ringers infusion 1,000 mL  . ropivacaine (PF) 2 mg/mL (0.2%) (NAROPIN) injection 18 mL  . triamcinolone acetonide (KENALOG-40) injection 80 mg   Medications administered: We administered lidocaine, ropivacaine (PF) 2 mg/mL (0.2%), and triamcinolone acetonide.  See the medical record for exact dosing, route, and time of administration.  New Prescriptions   No medications on file   Disposition: Discharge home  Discharge Date & Time: 07/28/2018; 0900 hrs.   Physician-requested Follow-up: Return for post-procedure eval (2 wks), w/ Dr. Laban Emperor.  Future Appointments  Date Time Provider Department Center  08/15/2018  2:00 PM Delano Metz, MD [facility] None   Primary Care Physician: Emogene Morgan, MD Location: [facility] Note by: Oswaldo Done, MD Date: 07/28/2018; Time: 9:54 AM  Disclaimer:  Medicine is not an Visual merchandiser. The only guarantee in medicine is that nothing is guaranteed. It is important to note that the decision to proceed with this intervention was based on the information collected from the patient. The Data and conclusions were drawn from the patient's questionnaire, the interview, and the physical examination. Because the information was provided in large part by the patient, it cannot be guaranteed that it has not been purposely or unconsciously manipulated. Every effort has been made to obtain as much relevant data as possible for this evaluation. It is important to note that the conclusions that lead to this procedure are  derived in large part from the available data. Always take into account that the treatment will also be dependent on availability of resources and existing treatment guidelines, considered by other Pain Management Practitioners as being common knowledge and practice, at the time of the intervention. For Medico-Legal purposes, it is also important to point out that variation in procedural techniques and pharmacological choices are the acceptable norm. The indications, contraindications, technique, and results of the above procedure should only be interpreted and judged by a Board-Certified Interventional Pain Specialist with extensive familiarity and expertise in the same exact procedure and technique.

## 2018-07-28 ENCOUNTER — Other Ambulatory Visit: Payer: Self-pay

## 2018-07-28 ENCOUNTER — Encounter: Payer: Self-pay | Admitting: Pain Medicine

## 2018-07-28 ENCOUNTER — Ambulatory Visit
Admission: RE | Admit: 2018-07-28 | Discharge: 2018-07-28 | Disposition: A | Discharge status: Home / Self Care | Payer: 59 | Source: Ambulatory Visit | Attending: Pain Medicine | Admitting: Pain Medicine

## 2018-07-28 ENCOUNTER — Ambulatory Visit (HOSPITAL_BASED_OUTPATIENT_CLINIC_OR_DEPARTMENT_OTHER): Payer: 59 | Admitting: Pain Medicine

## 2018-07-28 VITALS — BP 142/85 | HR 90 | Temp 98.9°F | Resp 18 | Ht 62.0 in | Wt 203.0 lb

## 2018-07-28 DIAGNOSIS — M47816 Spondylosis without myelopathy or radiculopathy, lumbar region: Secondary | ICD-10-CM | POA: Insufficient documentation

## 2018-07-28 DIAGNOSIS — M47817 Spondylosis without myelopathy or radiculopathy, lumbosacral region: Secondary | ICD-10-CM | POA: Insufficient documentation

## 2018-07-28 DIAGNOSIS — M545 Low back pain: Secondary | ICD-10-CM | POA: Insufficient documentation

## 2018-07-28 DIAGNOSIS — G8929 Other chronic pain: Secondary | ICD-10-CM | POA: Diagnosis not present

## 2018-07-28 MED ORDER — TRIAMCINOLONE ACETONIDE 40 MG/ML IJ SUSP
80.0000 mg | Freq: Once | INTRAMUSCULAR | Status: AC
  Administered 2018-07-28: 80 mg
  Filled 2018-07-28: qty 2

## 2018-07-28 MED ORDER — LIDOCAINE HCL 2 % IJ SOLN
20.0000 mL | Freq: Once | INTRAMUSCULAR | Status: AC
  Administered 2018-07-28: 400 mg
  Filled 2018-07-28: qty 40

## 2018-07-28 MED ORDER — ROPIVACAINE HCL 2 MG/ML IJ SOLN
18.0000 mL | Freq: Once | INTRAMUSCULAR | Status: AC
  Administered 2018-07-28: 18 mL via PERINEURAL
  Filled 2018-07-28: qty 20

## 2018-07-28 MED ORDER — MIDAZOLAM HCL 5 MG/5ML IJ SOLN: 1.0000 mg | INTRAMUSCULAR | Status: DC | PRN

## 2018-07-28 MED ORDER — FENTANYL CITRATE (PF) 100 MCG/2ML IJ SOLN: 25.0000 ug | INTRAMUSCULAR | Status: DC | PRN

## 2018-07-28 MED ORDER — LACTATED RINGERS IV SOLN: 1000.0000 mL | Freq: Once | INTRAVENOUS | Status: DC

## 2018-07-28 NOTE — Progress Notes (Signed)
Safety precautions to be maintained throughout the outpatient stay will include: orient to surroundings, keep bed in low position, maintain call bell within reach at all times, provide assistance with transfer out of bed and ambulation. Patient felt light headed post procedure.  Patient placed in wheelchair.  Given gatorade and crackers.  States head is feeling better.   Vital signs stable.  States she is feeling better.  Tolerated po fluid and crackers.  Denies pain.

## 2018-07-29 ENCOUNTER — Telehealth: Payer: Self-pay

## 2018-07-29 NOTE — Telephone Encounter (Signed)
Post procedure phone call.  LM 

## 2018-08-15 ENCOUNTER — Ambulatory Visit: Payer: 59 | Admitting: Pain Medicine

## 2018-08-16 NOTE — Progress Notes (Signed)
Patient's Name: Sandra Strong  MRN: [id number]  Referring Provider: Donnie Coffin, MD  DOB: [birth year]  PCP: Donnie Coffin, MD  DOS: 08/17/2018  Note by: Gaspar Cola, MD  Service setting: Ambulatory outpatient  Specialty: Interventional Pain Management  Location: [facility] (AMB) Pain Management Facility    Patient type: Established   Primary Reason(s) for Visit: Encounter for post-procedure evaluation of chronic illness with mild to moderate exacerbation CC: Back Pain (bilateral) and Ankle Pain (left)  HPI  Sandra Strong is a 62 y.o. year old, female patient, who comes today for a post-procedure evaluation. She has Chronic low back pain (Primary Area of Pain) (Bilateral) (R>L); Chronic ankle pain (Secondary Area of Pain) (Bilateral) (R>L); Chronic knee pain (Tertiary Area of Pain) (Bilateral) (L>R); Chronic pain syndrome; Opiate use; Pharmacologic therapy; Disorder of skeletal system; Problems influencing health status; Elevated C-reactive protein (CRP); Chronic sacroiliac joint pain (Bilateral) (R>L); Spondylosis without myelopathy or radiculopathy, lumbosacral region; Lumbar facet syndrome (Bilateral) (R>L); Other specified dorsopathies, sacral and sacrococcygeal region; Levoscoliosis; Lumbar facet arthropathy; 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 Her primarily concern today is the Back Pain (bilateral) and Ankle Pain (left)  Pain Assessment: Location: Lower, Left, Right Back Radiating: denies Onset: More than a month ago Duration: Chronic pain Quality:  Stabbing Severity: 0-No pain/10 (subjective, self-reported pain score)  Note: Reported level is compatible with observation.                               Effect on ADL: "I am not able to do any more activities" Timing: Intermittent(depends on movement) Modifying factors: injections, meds BP: (!) 144/88  HR: 96  Sandra Strong comes in today for post-procedure evaluation after the treatment done on 07/29/2018.  Further details on both, my assessment(s), as well as the proposed treatment plan, please see below.  Post-Procedure Assessment  07/28/2018 Procedure: Diagnostic bilateral lumbar facet block #1under fluoroscopic guidance and IV sedation Pre-procedure pain score:  2/10 Post-procedure pain score: 0/10 (100% relief) Influential Factors: BMI: 36.58 kg/m Intra-procedural challenges: None observed.         Assessment challenges: None detected.              Reported side-effects: None.        Post-procedural adverse reactions or complications: None reported         Sedation: Sedation provided. When no sedatives are used, the analgesic levels obtained are directly associated to the effectiveness of the local anesthetics. However, when sedation is provided, the level of analgesia obtained during the initial 1 hour following the intervention, is believed to be the result of a combination of factors. These factors may include, but are not limited to: 1. The effectiveness of the local anesthetics used. 2. The effects of the analgesic(s) and/or anxiolytic(s) used. 3. The degree of discomfort experienced by the patient at the time of the procedure. 4. The patients ability and reliability in recalling and recording the events. 5. The presence and influence of possible secondary gains and/or psychosocial factors. Reported result: Relief experienced during the 1st hour after the procedure: 100 % (Ultra-Short Term Relief)  Interpretative annotation: Clinically appropriate result. Analgesia  during this period is likely to be Local Anesthetic and/or IV Sedative (Analgesic/Anxiolytic) related.          Effects of local anesthetic: The analgesic effects attained during this period are directly associated to the localized infiltration of local anesthetics and therefore cary significant diagnostic value as to the etiological location, or anatomical origin, of the pain. Expected duration of relief is directly dependent on the pharmacodynamics of the local anesthetic used. Long-acting (4-6 hours) anesthetics used.  Reported result: Relief during the next 4 to 6 hour after the procedure: 100 % (Short-Term Relief)            Interpretative annotation: Clinically appropriate result. Analgesia during this period is likely to be Local Anesthetic-related.          Long-term benefit: Defined as the period of time past the expected duration of local anesthetics (1 hour for short-acting and 4-6 hours for long-acting). With the possible exception of prolonged sympathetic blockade from the local anesthetics, benefits during this period are typically attributed to, or associated with, other factors such as analgesic sensory neuropraxia, antiinflammatory effects, or beneficial biochemical changes provided by agents other than the local anesthetics.  Reported result: Extended relief following procedure: 85 %(ongoing) (Long-Term Relief)            Interpretative annotation: Clinically possible results. Good relief. No permanent benefit expected. Inflammation plays a part in the etiology to the pain.          Current benefits: Defined as reported results that persistent at this point in time.   Analgesia: 85 %            Function: Somewhat improved ROM: Somewhat improved Interpretative annotation: Ongoing benefit. Therapeutic benefit observed. Effective therapeutic approach. Benefit could be steroid-related.  Interpretation: Results would suggest a successful diagnostic intervention.                  Plan:   Consider diagnostic procedure No.: 2          Laboratory Chemistry  Inflammation Markers (CRP: Acute Phase) (ESR: Chronic Phase) Lab Results  Component Value Date   CRP 7.7 (H) 01/06/2018   ESRSEDRATE 8 01/06/2018                         Renal Markers Lab Results  Component Value Date   BUN 13 01/06/2018   CREATININE 0.67 01/06/2018   BCR 19 01/06/2018   GFRAA 111 01/06/2018   GFRNONAA 97 01/06/2018                             Hepatic Markers Lab Results  Component Value Date   AST 17 01/06/2018   ALBUMIN 4.2 01/06/2018                        Neuropathy Markers Lab Results  Component Value Date   VITAMINB12 583 01/06/2018                        Hematology Parameters Lab Results  Component Value Date   PLT 330 03/20/2017   HGB 13.4 03/20/2017   HCT 39.9 03/20/2017                        CV Markers Lab Results  Component Value Date   CKTOTAL  105 09/22/2012   CKMB 1.3 09/22/2012   TROPONINI <0.03 03/20/2017                         Note: Lab results reviewed.  Recent Imaging Results   Results for orders placed in visit on 07/28/18  DG C-Arm 1-60 Min-No Report   Narrative Fluoroscopy was utilized by the requesting physician.  No radiographic  interpretation.    Interpretation Report: Fluoroscopy was used during the procedure to assist with needle guidance. The images were interpreted intraoperatively by the requesting physician.  Meds   Current Outpatient Medications:  .  acetaminophen (TYLENOL) 500 MG tablet, Take 500 mg by mouth every 6 (six) hours as needed. 2 tabs, Disp: , Rfl:  .  albuterol (PROVENTIL HFA;VENTOLIN HFA) 108 (90 Base) MCG/ACT inhaler, Inhale 2 puffs into the lungs every 4 (four) hours as needed for wheezing or shortness of breath., Disp: , Rfl:  .  albuterol (PROVENTIL) (2.5 MG/3ML) 0.083% nebulizer solution, Take 2.5 mg by nebulization every 6 (six) hours as needed for wheezing or shortness of breath., Disp: , Rfl:  .  alendronate  (FOSAMAX) 70 MG tablet, Take 70 mg by mouth once a week. Take with a full glass of water on an empty stomach., Disp: , Rfl:  .  cetirizine (ZYRTEC) 10 MG tablet, Take 10 mg by mouth daily., Disp: , Rfl:  .  Cholecalciferol (VITAMIN D3) 2000 units TABS, Take 2,000 Units by mouth daily., Disp: , Rfl:  .  diphenhydrAMINE (BENADRYL) 25 mg capsule, Take 50 mg by mouth at bedtime as needed., Disp: , Rfl:  .  etodolac (LODINE) 400 MG tablet, Take 400 mg by mouth 2 (two) times daily., Disp: , Rfl:  .  fluticasone (FLONASE) 50 MCG/ACT nasal spray, Place 2 sprays into both nostrils daily., Disp: , Rfl:  .  fluticasone (FLOVENT HFA) 110 MCG/ACT inhaler, Inhale 2 puffs into the lungs 2 (two) times daily., Disp: , Rfl:  .  montelukast (SINGULAIR) 10 MG tablet, Take 10 mg by mouth at bedtime., Disp: , Rfl:  .  Multiple Vitamins-Minerals (MULTIVITAMIN WITH MINERALS) tablet, Take 1 tablet by mouth daily., Disp: , Rfl:  .  omeprazole (PRILOSEC) 40 MG capsule, Take 40 mg by mouth daily., Disp: , Rfl:  .  sertraline (ZOLOFT) 100 MG tablet, Take 100 mg by mouth daily., Disp: , Rfl:  .  sodium chloride (OCEAN) 0.65 % SOLN nasal spray, Place 1 spray into both nostrils as needed for congestion., Disp: 1 Bottle, Rfl: 12 .  zolpidem (AMBIEN) 10 MG tablet, , Disp: , Rfl:   ROS  Constitutional: Denies any fever or chills Gastrointestinal: No reported hemesis, hematochezia, vomiting, or acute GI distress Musculoskeletal: Denies any acute onset joint swelling, redness, loss of ROM, or weakness Neurological: No reported episodes of acute onset apraxia, aphasia, dysarthria, agnosia, amnesia, paralysis, loss of coordination, or loss of consciousness  Allergies  Sandra Strong is allergic to spiriva handihaler [tiotropium bromide monohydrate]; advair diskus [fluticasone-salmeterol]; erythromycin; and sulfa antibiotics.  PFSH  Drug: Sandra Strong  reports that she does not use drugs. Alcohol:  reports that she does not drink  alcohol. Tobacco:  reports that she has been smoking. She started smoking about 49 years ago. She has a 64.50 pack-year smoking history. She has never used smokeless tobacco. Medical:  has a past medical history of Anxiety, Arthritis, Asthma, COPD (chronic obstructive pulmonary disease) ([place]), GERD (gastroesophageal reflux disease), and Hypertension. Surgical: Sandra Strong  has a past surgical history that includes Rotator cuff repair (Right); Abdominal hysterectomy; Hernia repair; Foot surgery (Left); and carpel tunnel (Bilateral). Family: family history includes Breast cancer in her paternal aunt; Breast cancer (age of onset: 74) in her mother.  Constitutional Exam  General appearance: Well nourished, well developed, and well hydrated. In no apparent acute distress Vitals:   08/17/18 0822  BP: (!) 144/88  Pulse: 96  Resp: 16  Temp: 97.8 F (36.6 C)  SpO2: 99%  Weight: 200 lb (90.7 kg)  Height: '5\' 2"'  (1.575 m)   BMI Assessment: Estimated body mass index is 36.58 kg/m as calculated from the following:   Height as of this encounter: '5\' 2"'  (1.575 m).   Weight as of this encounter: 200 lb (90.7 kg).  BMI interpretation table: BMI level Category Range association with higher incidence of chronic pain  <18 kg/m2 Underweight   18.5-24.9 kg/m2 Ideal body weight   25-29.9 kg/m2 Overweight Increased incidence by 20%  30-34.9 kg/m2 Obese (Class I) Increased incidence by 68%  35-39.9 kg/m2 Severe obesity (Class II) Increased incidence by 136%  >40 kg/m2 Extreme obesity (Class III) Increased incidence by 254%   Patient's current BMI Ideal Body weight  Body mass index is 36.58 kg/m. Ideal body weight: 50.1 kg (110 lb 7.2 oz) Adjusted ideal body weight: 66.3 kg (146 lb 4.3 oz)   BMI Readings from Last 4 Encounters:  08/17/18 36.58 kg/m  07/28/18 37.13 kg/m  07/12/18 37.13 kg/m  07/11/18 36.95 kg/m   Wt Readings from Last 4 Encounters:  08/17/18 200 lb (90.7 kg)  07/28/18 203 lb (92.1  kg)  07/12/18 203 lb (92.1 kg)  07/11/18 202 lb (91.6 kg)  Psych/Mental status: Alert, oriented x 3 (person, place, & time)       Eyes: PERLA Respiratory: No evidence of acute respiratory distress  Cervical Spine Area Exam  Skin & Axial Inspection: No masses, redness, edema, swelling, or associated skin lesions Alignment: Symmetrical Functional ROM: Unrestricted ROM      Stability: No instability detected Muscle Tone/Strength: Functionally intact. No obvious neuro-muscular anomalies detected. Sensory (Neurological): Unimpaired Palpation: No palpable anomalies              Upper Extremity (UE) Exam    Side: Right upper extremity  Side: Left upper extremity  Skin & Extremity Inspection: Skin color, temperature, and hair growth are WNL. No peripheral edema or cyanosis. No masses, redness, swelling, asymmetry, or associated skin lesions. No contractures.  Skin & Extremity Inspection: Skin color, temperature, and hair growth are WNL. No peripheral edema or cyanosis. No masses, redness, swelling, asymmetry, or associated skin lesions. No contractures.  Functional ROM: Unrestricted ROM          Functional ROM: Unrestricted ROM          Muscle Tone/Strength: Functionally intact. No obvious neuro-muscular anomalies detected.  Muscle Tone/Strength: Functionally intact. No obvious neuro-muscular anomalies detected.  Sensory (Neurological): Unimpaired          Sensory (Neurological): Unimpaired          Palpation: No palpable anomalies              Palpation: No palpable anomalies              Provocative Test(s):  Phalen's test: deferred Tinel's test: deferred Apley's scratch test (touch opposite shoulder):  Action 1 (Across chest): deferred Action 2 (Overhead): deferred Action 3 (LB reach): deferred   Provocative Test(s):  Phalen's test: deferred Tinel's test: deferred  Apley's scratch test (touch opposite shoulder):  Action 1 (Across chest): deferred Action 2 (Overhead): deferred Action 3  (LB reach): deferred    Thoracic Spine Area Exam  Skin & Axial Inspection: No masses, redness, or swelling Alignment: Symmetrical Functional ROM: Unrestricted ROM Stability: No instability detected Muscle Tone/Strength: Functionally intact. No obvious neuro-muscular anomalies detected. Sensory (Neurological): Unimpaired Muscle strength & Tone: No palpable anomalies  Lumbar Spine Area Exam  Skin & Axial Inspection: No masses, redness, or swelling Alignment: Symmetrical Functional ROM: Improved after treatment       Stability: No instability detected Muscle Tone/Strength: Functionally intact. No obvious neuro-muscular anomalies detected. Sensory (Neurological): Movement-associated pain Palpation: Complains of area being tender to palpation       Provocative Tests: Hyperextension/rotation test: (+) bilaterally for facet joint pain. Lumbar quadrant test (Kemp's test): (+) bilaterally for facet joint pain. Lateral bending test: deferred today       Patrick's Maneuver: deferred today                   FABER test: deferred today                   S-I anterior distraction/compression test: deferred today         S-I lateral compression test: deferred today         S-I Thigh-thrust test: deferred today         S-I Gaenslen's test: deferred today          Gait & Posture Assessment  Ambulation: Unassisted Gait: Relatively normal for age and body habitus Posture: WNL   Lower Extremity Exam    Side: Right lower extremity  Side: Left lower extremity  Stability: No instability observed          Stability: No instability observed          Skin & Extremity Inspection: Skin color, temperature, and hair growth are WNL. No peripheral edema or cyanosis. No masses, redness, swelling, asymmetry, or associated skin lesions. No contractures.  Skin & Extremity Inspection: Skin color, temperature, and hair growth are WNL. No peripheral edema or cyanosis. No masses, redness, swelling, asymmetry, or  associated skin lesions. No contractures.  Functional ROM: Unrestricted ROM                  Functional ROM: Unrestricted ROM                  Muscle Tone/Strength: Functionally intact. No obvious neuro-muscular anomalies detected.  Muscle Tone/Strength: Functionally intact. No obvious neuro-muscular anomalies detected.  Sensory (Neurological): Unimpaired  Sensory (Neurological): Unimpaired  Palpation: No palpable anomalies  Palpation: No palpable anomalies   Assessment  Primary Diagnosis & Pertinent Problem List: The primary encounter diagnosis was Chronic low back pain (Primary Area of Pain) (Bilateral) (R>L). Diagnoses of Coccygodynia, Lumbar facet syndrome (Bilateral) (R>L), Spondylosis without myelopathy or radiculopathy, lumbosacral region, and Chronic hip pain (Left) were also pertinent to this visit.  Status Diagnosis  Improving Resolved Improving 1. Chronic low back pain (Primary Area of Pain) (Bilateral) (R>L)   2. Coccygodynia   3. Lumbar facet syndrome (Bilateral) (R>L)   4. Spondylosis without myelopathy or radiculopathy, lumbosacral region   5. Chronic hip pain (Left)     Problems updated and reviewed during this visit: Problem  Chronic hip pain (Left)  Lumbar Spondylosis   Plan of Care  Pharmacotherapy (Medications Ordered): No orders of the defined types were placed in this encounter.  Medications administered today: [employer]  L. Hoff had no medications administered during this visit.  Procedure Orders     LUMBAR FACET(MEDIAL BRANCH NERVE BLOCK) [facility] Lab Orders  No laboratory test(s) ordered today    Imaging Orders     DG HIP UNILAT W OR W/O PELVIS 2-3 VIEWS LEFT  Referral Orders     Ambulatory referral to Chiropractic     Ambulatory referral to Physical Therapy Interventional management options: Planned, scheduled, and/or pending:   Diagnostic bilateral lumbar facet block #2under fluoroscopic guidance and IV sedation  Referral to PT & Chiropractor     Considering:   Diagnostic bilateral lumbar facet nerve block Possible bilateral lumbar facet RFA Diagnostic bilateral sacroiliac joint block Possible bilateral sacroiliac joint RFA Diagnosticbilateral intra-articular knee injection Diagnostic bilateral Hyalgan series Diagnostic bilateral genicular Nerve block Possible bilateral genicular RFA Diagnostic bilateral ankle injections   Palliative PRN treatment(s):   Palliative (Midline) sacrococcygeal nerve block  Palliative (Midline) sacrococcygeal nerve RFA#2 Palliative bilateral bilateral lumbar facet blocks    Provider-requested follow-up: Return for Procedure (w/ sedation): (B) L-FCT BLK #2.  No future appointments. Primary Care Physician: Donnie Coffin, MD Location: [facility] Outpatient Pain Management Facility Note by: Gaspar Cola, MD Date: 08/17/2018; Time: 1:20 PM

## 2018-08-17 ENCOUNTER — Encounter: Payer: Self-pay | Admitting: Pain Medicine

## 2018-08-17 ENCOUNTER — Ambulatory Visit: Payer: 59 | Attending: Pain Medicine | Admitting: Pain Medicine

## 2018-08-17 ENCOUNTER — Other Ambulatory Visit: Payer: Self-pay

## 2018-08-17 VITALS — BP 144/88 | HR 96 | Temp 97.8°F | Resp 16 | Ht 62.0 in | Wt 200.0 lb

## 2018-08-17 DIAGNOSIS — M545 Low back pain, unspecified: Secondary | ICD-10-CM

## 2018-08-17 DIAGNOSIS — Z882 Allergy status to sulfonamides status: Secondary | ICD-10-CM | POA: Insufficient documentation

## 2018-08-17 DIAGNOSIS — Z79899 Other long term (current) drug therapy: Secondary | ICD-10-CM | POA: Insufficient documentation

## 2018-08-17 DIAGNOSIS — J449 Chronic obstructive pulmonary disease, unspecified: Secondary | ICD-10-CM | POA: Insufficient documentation

## 2018-08-17 DIAGNOSIS — M47817 Spondylosis without myelopathy or radiculopathy, lumbosacral region: Secondary | ICD-10-CM

## 2018-08-17 DIAGNOSIS — G894 Chronic pain syndrome: Secondary | ICD-10-CM | POA: Insufficient documentation

## 2018-08-17 DIAGNOSIS — M25552 Pain in left hip: Secondary | ICD-10-CM | POA: Diagnosis not present

## 2018-08-17 DIAGNOSIS — Z881 Allergy status to other antibiotic agents status: Secondary | ICD-10-CM | POA: Diagnosis not present

## 2018-08-17 DIAGNOSIS — M533 Sacrococcygeal disorders, not elsewhere classified: Secondary | ICD-10-CM | POA: Diagnosis not present

## 2018-08-17 DIAGNOSIS — M47816 Spondylosis without myelopathy or radiculopathy, lumbar region: Secondary | ICD-10-CM | POA: Diagnosis not present

## 2018-08-17 DIAGNOSIS — Z79891 Long term (current) use of opiate analgesic: Secondary | ICD-10-CM | POA: Insufficient documentation

## 2018-08-17 DIAGNOSIS — I10 Essential (primary) hypertension: Secondary | ICD-10-CM | POA: Diagnosis not present

## 2018-08-17 DIAGNOSIS — K219 Gastro-esophageal reflux disease without esophagitis: Secondary | ICD-10-CM | POA: Diagnosis not present

## 2018-08-17 DIAGNOSIS — G8929 Other chronic pain: Secondary | ICD-10-CM

## 2018-08-17 DIAGNOSIS — M5388 Other specified dorsopathies, sacral and sacrococcygeal region: Secondary | ICD-10-CM | POA: Insufficient documentation

## 2018-08-17 DIAGNOSIS — F419 Anxiety disorder, unspecified: Secondary | ICD-10-CM | POA: Diagnosis not present

## 2018-08-17 NOTE — Patient Instructions (Signed)
____________________________________________________________________________________________  Preparing for Procedure with Sedation  Instructions: . Oral Intake: Do not eat or drink anything for at least 8 hours prior to your procedure. . Transportation: Public transportation is not allowed. Bring an adult driver. The driver must be physically present in our waiting room before any procedure can be started. . Physical Assistance: Bring an adult physically capable of assisting you, in the event you need help. This adult should keep you company at home for at least 6 hours after the procedure. . Blood Pressure Medicine: Take your blood pressure medicine with a sip of water the morning of the procedure. . Blood thinners: Notify our staff if you are taking any blood thinners. Depending on which one you take, there will be specific instructions on how and when to stop it. . Diabetics on insulin: Notify the staff so that you can be scheduled 1st case in the morning. If your diabetes requires high dose insulin, take only  of your normal insulin dose the morning of the procedure and notify the staff that you have done so. . Preventing infections: Shower with an antibacterial soap the morning of your procedure. . Build-up your immune system: Take 1000 mg of Vitamin C with every meal (3 times a day) the day prior to your procedure. . Antibiotics: Inform the staff if you have a condition or reason that requires you to take antibiotics before dental procedures. . Pregnancy: If you are pregnant, call and cancel the procedure. . Sickness: If you have a cold, fever, or any active infections, call and cancel the procedure. . Arrival: You must be in the facility at least 30 minutes prior to your scheduled procedure. . Children: Do not bring children with you. . Dress appropriately: Bring dark clothing that you would not mind if they get stained. . Valuables: Do not bring any jewelry or valuables.  Procedure  appointments are reserved for interventional treatments only. . No Prescription Refills. . No medication changes will be discussed during procedure appointments. . No disability issues will be discussed.  Reasons to call and reschedule or cancel your procedure: (Following these recommendations will minimize the risk of a serious complication.) . Surgeries: Avoid having procedures within 2 weeks of any surgery. (Avoid for 2 weeks before or after any surgery). . Flu Shots: Avoid having procedures within 2 weeks of a flu shots or . (Avoid for 2 weeks before or after immunizations). . Barium: Avoid having a procedure within 7-10 days after having had a radiological study involving the use of radiological contrast. (Myelograms, Barium swallow or enema study). . Heart attacks: Avoid any elective procedures or surgeries for the initial 6 months after a "Myocardial Infarction" (Heart Attack). . Blood thinners: It is imperative that you stop these medications before procedures. Let us know if you if you take any blood thinner.  . Infection: Avoid procedures during or within two weeks of an infection (including chest colds or gastrointestinal problems). Symptoms associated with infections include: Localized redness, fever, chills, night sweats or profuse sweating, burning sensation when voiding, cough, congestion, stuffiness, runny nose, sore throat, diarrhea, nausea, vomiting, cold or Flu symptoms, recent or current infections. It is specially important if the infection is over the area that we intend to treat. . Heart and lung problems: Symptoms that may suggest an active cardiopulmonary problem include: cough, chest pain, breathing difficulties or shortness of breath, dizziness, ankle swelling, uncontrolled high or unusually low blood pressure, and/or palpitations. If you are experiencing any of these symptoms, cancel   your procedure and contact your primary care physician for an evaluation.  Remember:   Regular Business hours are:  Monday to Thursday 8:00 AM to 4:00 PM  Provider's Schedule: Jemal Miskell, MD:  Procedure days: Tuesday and Thursday 7:30 AM to 4:00 PM  Bilal Lateef, MD:  Procedure days: Monday and Wednesday 7:30 AM to 4:00 PM ____________________________________________________________________________________________    

## 2018-08-17 NOTE — Progress Notes (Signed)
Safety precautions to be maintained throughout the outpatient stay will include: orient to surroundings, keep bed in low position, maintain call bell within reach at all times, provide assistance with transfer out of bed and ambulation.  

## 2018-08-30 ENCOUNTER — Ambulatory Visit: Payer: 59 | Admitting: Pain Medicine

## 2018-09-13 ENCOUNTER — Other Ambulatory Visit: Payer: Self-pay | Admitting: Pain Medicine

## 2018-09-13 NOTE — Progress Notes (Unsigned)
NOTE: Today we received a phone call from the [facility] indicating that the patient was over there requesting a procedure from Dr. Yves Dill.  I told him to give the patient the option of either going to Dr. Yves Dill or to this clinic, but not both.  If she wants to proceed with a treatment over there, then it is perfectly okay, but that also means we will not be seeing her again.

## 2018-09-29 ENCOUNTER — Ambulatory Visit: Payer: 59 | Admitting: Physical Therapy

## 2018-10-11 ENCOUNTER — Encounter: Payer: Self-pay | Admitting: Nurse Practitioner

## 2018-10-11 ENCOUNTER — Other Ambulatory Visit: Payer: Self-pay

## 2018-10-11 ENCOUNTER — Ambulatory Visit: Payer: 59 | Attending: Nurse Practitioner | Admitting: Nurse Practitioner

## 2018-10-11 VITALS — BP 127/81 | HR 81 | Temp 98.1°F | Resp 18 | Ht 62.0 in | Wt 200.0 lb

## 2018-10-11 DIAGNOSIS — F419 Anxiety disorder, unspecified: Secondary | ICD-10-CM | POA: Insufficient documentation

## 2018-10-11 DIAGNOSIS — R252 Cramp and spasm: Secondary | ICD-10-CM | POA: Insufficient documentation

## 2018-10-11 DIAGNOSIS — M25561 Pain in right knee: Secondary | ICD-10-CM | POA: Insufficient documentation

## 2018-10-11 DIAGNOSIS — K219 Gastro-esophageal reflux disease without esophagitis: Secondary | ICD-10-CM | POA: Diagnosis not present

## 2018-10-11 DIAGNOSIS — G8929 Other chronic pain: Secondary | ICD-10-CM

## 2018-10-11 DIAGNOSIS — R7982 Elevated C-reactive protein (CRP): Secondary | ICD-10-CM | POA: Diagnosis not present

## 2018-10-11 DIAGNOSIS — Z882 Allergy status to sulfonamides status: Secondary | ICD-10-CM | POA: Diagnosis not present

## 2018-10-11 DIAGNOSIS — K5900 Constipation, unspecified: Secondary | ICD-10-CM | POA: Diagnosis not present

## 2018-10-11 DIAGNOSIS — F17210 Nicotine dependence, cigarettes, uncomplicated: Secondary | ICD-10-CM | POA: Insufficient documentation

## 2018-10-11 DIAGNOSIS — J449 Chronic obstructive pulmonary disease, unspecified: Secondary | ICD-10-CM | POA: Diagnosis not present

## 2018-10-11 DIAGNOSIS — M47816 Spondylosis without myelopathy or radiculopathy, lumbar region: Secondary | ICD-10-CM | POA: Diagnosis not present

## 2018-10-11 DIAGNOSIS — M1711 Unilateral primary osteoarthritis, right knee: Secondary | ICD-10-CM | POA: Diagnosis not present

## 2018-10-11 DIAGNOSIS — I10 Essential (primary) hypertension: Secondary | ICD-10-CM | POA: Insufficient documentation

## 2018-10-11 DIAGNOSIS — M545 Low back pain: Secondary | ICD-10-CM | POA: Diagnosis present

## 2018-10-11 DIAGNOSIS — M25579 Pain in unspecified ankle and joints of unspecified foot: Secondary | ICD-10-CM | POA: Insufficient documentation

## 2018-10-11 DIAGNOSIS — M25562 Pain in left knee: Secondary | ICD-10-CM | POA: Diagnosis not present

## 2018-10-11 DIAGNOSIS — G894 Chronic pain syndrome: Secondary | ICD-10-CM | POA: Insufficient documentation

## 2018-10-11 DIAGNOSIS — Z881 Allergy status to other antibiotic agents status: Secondary | ICD-10-CM | POA: Diagnosis not present

## 2018-10-11 DIAGNOSIS — Z79899 Other long term (current) drug therapy: Secondary | ICD-10-CM | POA: Diagnosis not present

## 2018-10-11 DIAGNOSIS — M533 Sacrococcygeal disorders, not elsewhere classified: Secondary | ICD-10-CM | POA: Diagnosis not present

## 2018-10-11 NOTE — Progress Notes (Signed)
Safety precautions to be maintained throughout the outpatient stay will include: orient to surroundings, keep bed in low position, maintain call bell within reach at all times, provide assistance with transfer out of bed and ambulation.  

## 2018-10-11 NOTE — Progress Notes (Signed)
Patient's Name: Sandra Strong  MRN: [id number]  Referring Provider: Donnie Coffin, MD  DOB: 1956/05/02  PCP: Donnie Coffin, MD  DOS: 10/11/2018  Note by: Vevelyn Francois NP  Service setting: Ambulatory outpatient  Specialty: Interventional Pain Management  Location: [facility] (AMB) Pain Management Facility    Patient type: Established    Primary Reason(s) for Visit: Evaluation of chronic illnesses with exacerbation, or progression (Level of risk: moderate) CC: Back Pain (lower)  HPI  Sandra Strong is a 62 y.o. year old, female patient, who comes today for a follow-up evaluation. She has Chronic low back pain (Primary Area of Pain) (Bilateral) (R>L); Chronic ankle pain (Secondary Area of Pain) (Bilateral) (R>L); Chronic knee pain (Tertiary Area of Pain) (Bilateral) (L>R); Chronic pain syndrome; Opiate use; Pharmacologic therapy; Disorder of skeletal system; Problems influencing health status; Elevated C-reactive protein (CRP); Chronic sacroiliac joint pain (Bilateral) (R>L); Spondylosis without myelopathy or radiculopathy, lumbosacral region; Lumbar facet syndrome (Bilateral) (R>L); Other specified dorsopathies, sacral and sacrococcygeal region; Levoscoliosis; Lumbar facet arthropathy; 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 Sandra Strong was last seen on 07/11/2018. Her primarily concern today is the Back Pain (lower)  Pain Assessment: Location: Lower Back Radiating: denies Onset: More than a month ago Duration: Chronic pain Quality: Stabbing Severity: 0-No pain/10 (subjective,  self-reported pain score)  Note: Reported level is compatible with observation.                          Effect on ADL: "when in pain, daily activities are very limited" Timing: Intermittent Modifying factors: chiro, rest BP: 127/81  HR: 81  Further details on both, my assessment(s), as well as the proposed treatment plan, please see below.  She is requesting a letter to show her abilities because she recently quit her job.   Laboratory Chemistry  Inflammation Markers (CRP: Acute Phase) (ESR: Chronic Phase) Lab Results  Component Value Date   CRP 7.7 (H) 01/06/2018   ESRSEDRATE 8 01/06/2018                         Rheumatology Markers No results found for: RF, ANA, LABURIC, URICUR, LYMEIGGIGMAB, LYMEABIGMQN, HLAB27                      Renal Function Markers Lab Results  Component Value Date   BUN 13 01/06/2018   CREATININE 0.67 01/06/2018   BCR 19 01/06/2018   GFRAA 111 01/06/2018   GFRNONAA 97 01/06/2018                             Hepatic Function Markers Lab Results  Component Value Date   AST 17 01/06/2018   ALBUMIN 4.2 01/06/2018   ALKPHOS 132 (H) 01/06/2018                        Electrolytes Lab Results  Component Value Date   NA 146 (H) 01/06/2018   K 4.2 01/06/2018   CL 107 (H) 01/06/2018   CALCIUM 9.5 01/06/2018   MG 1.9 01/06/2018  Neuropathy Markers Lab Results  Component Value Date   PYPPJKDT26 712 01/06/2018                        CNS Tests No results found for: COLORCSF, APPEARCSF, RBCCOUNTCSF, WBCCSF, POLYSCSF, LYMPHSCSF, EOSCSF, PROTEINCSF, GLUCCSF, JCVIRUS, CSFOLI, IGGCSF                      Bone Pathology Markers Lab Results  Component Value Date   25OHVITD1 51 01/06/2018   25OHVITD2 <1.0 01/06/2018   25OHVITD3 51 01/06/2018                         Coagulation Parameters Lab Results  Component Value Date   PLT 330 03/20/2017                        Cardiovascular Markers Lab Results  Component Value  Date   CKTOTAL 105 09/22/2012   CKMB 1.3 09/22/2012   TROPONINI <0.03 03/20/2017   HGB 13.4 03/20/2017   HCT 39.9 03/20/2017                         CA Markers No results found for: CEA, CA125, LABCA2                      Note: Lab results reviewed.  Recent Diagnostic Imaging Review  Lumbosacral Imaging:  Lumbar DG Bending views:  Results for orders placed during the hospital encounter of 01/06/18  DG Lumbar Spine Complete W/Bend   Narrative CLINICAL DATA:  Chronic low back pain for several years, limited range of motion  EXAM: LUMBAR SPINE - COMPLETE WITH BENDING VIEWS  COMPARISON:  None.  FINDINGS: There is mild curvature of the lumbar spine convex to the left by approximately 9 degrees. In the lateral view the lumbar vertebrae are in normal alignment. There is very mild degenerative disc disease at L2-3 and L3-4 levels with slight sclerosis and mild spurring. The remainder of intervertebral disc spaces appear normal. No compression deformity is seen. On oblique views only mild degenerative change of the facet joints is seen at L5-S1.  Through flexion and extension there is relatively normal range of motion with no malalignment  IMPRESSION: 1. Mild degenerative disc disease at L2-3 and L3-4. 2. Slight curvature of the lumbar spine convex to the left by 9 degrees. 3. Only mild degenerative change of the facet joints of L5-S1.   Electronically Signed   By: Ivar Drape M.D.   On: 01/06/2018 16:39   Ankle Imaging: Ankle-R DG Complete:  Results for orders placed during the hospital encounter of 01/06/18  DG Ankle Complete Right   Narrative CLINICAL DATA:  Ankle pain, no injury  EXAM: RIGHT ANKLE - COMPLETE 3+ VIEW  COMPARISON:  None.  FINDINGS: The right ankle joint appears normal. Alignment is normal. Only mild degenerative spurring is noted from the medial malleolus. No fracture is seen. There is a plantar calcaneal degenerative spur present. Some  spurring is noted from the tarsal navicula on the dorsal aspect as well.  IMPRESSION: No acute abnormality.  Mild degenerative change as noted above.   Electronically Signed   By: Ivar Drape M.D.   On: 01/06/2018 16:40    Ankle-L DG Complete:  Results for orders placed during the hospital encounter of 01/06/18  DG Ankle Complete Left   Narrative CLINICAL  DATA:  Ankle pain, no acute injury  EXAM: LEFT ANKLE COMPLETE - 3+ VIEW  COMPARISON:  None.  FINDINGS: The left ankle joint appears normal. Alignment is normal. No fracture is seen. A plantar calcaneal degenerative spur is present.  IMPRESSION: No acute abnormality.  Small plantar calcaneal degenerative spur.   Electronically Signed   By: Ivar Drape M.D.   On: 01/06/2018 16:40     Complexity Note: Imaging results reviewed. Results shared with Sandra Strong, using Layman's terms.                         Meds   Current Outpatient Medications:  .  acetaminophen (TYLENOL) 500 MG tablet, Take 500 mg by mouth every 6 (six) hours as needed. 2 tabs, Disp: , Rfl:  .  albuterol (PROVENTIL HFA;VENTOLIN HFA) 108 (90 Base) MCG/ACT inhaler, Inhale 2 puffs into the lungs every 4 (four) hours as needed for wheezing or shortness of breath., Disp: , Rfl:  .  albuterol (PROVENTIL) (2.5 MG/3ML) 0.083% nebulizer solution, Take 2.5 mg by nebulization every 6 (six) hours as needed for wheezing or shortness of breath., Disp: , Rfl:  .  alendronate (FOSAMAX) 70 MG tablet, Take 70 mg by mouth once a week. Take with a full glass of water on an empty stomach., Disp: , Rfl:  .  cetirizine (ZYRTEC) 10 MG tablet, Take 10 mg by mouth daily., Disp: , Rfl:  .  Cholecalciferol (VITAMIN D3) 2000 units TABS, Take 2,000 Units by mouth daily., Disp: , Rfl:  .  diphenhydrAMINE (BENADRYL) 25 mg capsule, Take 50 mg by mouth at bedtime as needed., Disp: , Rfl:  .  etodolac (LODINE) 400 MG tablet, Take 400 mg by mouth 2 (two) times daily., Disp: , Rfl:  .   fluticasone (FLONASE) 50 MCG/ACT nasal spray, Place 2 sprays into both nostrils daily., Disp: , Rfl:  .  fluticasone (FLOVENT HFA) 110 MCG/ACT inhaler, Inhale 2 puffs into the lungs 2 (two) times daily., Disp: , Rfl:  .  montelukast (SINGULAIR) 10 MG tablet, Take 10 mg by mouth at bedtime., Disp: , Rfl:  .  Multiple Vitamins-Minerals (MULTIVITAMIN WITH MINERALS) tablet, Take 1 tablet by mouth daily., Disp: , Rfl:  .  omeprazole (PRILOSEC) 40 MG capsule, Take 40 mg by mouth daily., Disp: , Rfl:  .  sertraline (ZOLOFT) 100 MG tablet, Take 100 mg by mouth daily., Disp: , Rfl:  .  sodium chloride (OCEAN) 0.65 % SOLN nasal spray, Place 1 spray into both nostrils as needed for congestion., Disp: 1 Bottle, Rfl: 12 .  zolpidem (AMBIEN) 10 MG tablet, , Disp: , Rfl:   ROS  Constitutional: Denies any fever or chills Gastrointestinal: No reported hemesis, hematochezia, vomiting, or acute GI distress Musculoskeletal: Denies any acute onset joint swelling, redness, loss of ROM, or weakness Neurological: No reported episodes of acute onset apraxia, aphasia, dysarthria, agnosia, amnesia, paralysis, loss of coordination, or loss of consciousness  Allergies  Sandra Strong is allergic to spiriva handihaler [tiotropium bromide monohydrate]; advair diskus [fluticasone-salmeterol]; erythromycin; and sulfa antibiotics.  PFSH  Drug: Sandra Strong  reports that she does not use drugs. Alcohol:  reports that she does not drink alcohol. Tobacco:  reports that she has been smoking. She started smoking about 49 years ago. She has a 64.50 pack-year smoking history. She has never used smokeless tobacco. Medical:  has a past medical history of Anxiety, Arthritis, Asthma, COPD (chronic obstructive pulmonary disease) ([place]), GERD (gastroesophageal reflux  disease), and Hypertension. Surgical: Sandra Strong  has a past surgical history that includes Rotator cuff repair (Right); Abdominal hysterectomy; Hernia repair; Foot surgery (Left); and  carpel tunnel (Bilateral). Family: family history includes Breast cancer in her paternal aunt; Breast cancer (age of onset: 69) in her mother.  Constitutional Exam  General appearance: Well nourished, well developed, and well hydrated. In no apparent acute distress Vitals:   10/11/18 1013  BP: 127/81  Pulse: 81  Resp: 18  Temp: 98.1 F (36.7 C)  TempSrc: Oral  SpO2: 95%  Weight: 200 lb (90.7 kg)  Height: '5\' 2"'  (1.575 m)  Psych/Mental status: Alert, oriented x 3 (person, place, & time)       Eyes: PERLA Respiratory: No evidence of acute respiratory distress  Lumbar Spine Area Exam  Skin & Axial Inspection: No masses, redness, or swelling Alignment: Symmetrical Functional ROM: Unrestricted ROM       Stability: No instability detected Muscle Tone/Strength: Functionally intact. No obvious neuro-muscular anomalies detected. Sensory (Neurological): Unimpaired Palpation: No palpable anomalies         Gait & Posture Assessment  Ambulation: Unassisted Gait: Relatively normal for age and body habitus Posture: WNL   Lower Extremity Exam    Side: Right lower extremity  Side: Left lower extremity  Stability: No instability observed          Stability: No instability observed          Skin & Extremity Inspection: Skin color, temperature, and hair growth are WNL. No peripheral edema or cyanosis. No masses, redness, swelling, asymmetry, or associated skin lesions. No contractures.  Skin & Extremity Inspection: Skin color, temperature, and hair growth are WNL. No peripheral edema or cyanosis. No masses, redness, swelling, asymmetry, or associated skin lesions. No contractures.  Functional ROM: Unrestricted ROM                  Functional ROM: Unrestricted ROM                  Muscle Tone/Strength: Functionally intact. No obvious neuro-muscular anomalies detected.  Muscle Tone/Strength: Functionally intact. No obvious neuro-muscular anomalies detected.  Sensory (Neurological): Unimpaired   Sensory (Neurological): Unimpaired  Palpation: No palpable anomalies  Palpation: No palpable anomalies   Assessment  Primary Diagnosis & Pertinent Problem List: The primary encounter diagnosis was Lumbar spondylosis. Diagnoses of Osteoarthritis of lumbar spine, Muscle cramps, and Chronic knee pain (Tertiary Area of Pain) (Bilateral) (L>R) were also pertinent to this visit.  Status Diagnosis  Controlled Controlled Controlled 1. Lumbar spondylosis   2. Osteoarthritis of lumbar spine   3. Muscle cramps   4. Chronic knee pain (Tertiary Area of Pain) (Bilateral) (L>R)     Problems updated and reviewed during this visit: No problems updated. Plan of Care  Pharmacotherapy (Medications Ordered): No orders of the defined types were placed in this encounter.  New Prescriptions   No medications on file   Medications administered today: Sandra Strong had no medications administered during this visit. Lab-work, procedure(s), and/or referral(s): Orders Placed This Encounter  Procedures  . Ambulatory referral to Physical Therapy   Imaging and/or referral(s): AMB REFERRAL TO PHYSICAL THERAPY Interventional management options: Planned, scheduled, and/or pending:   Not at this time.   Considering:   Diagnostic bilateral lumbar facet nerve block Possible bilateral lumbar facet RFA Diagnostic bilateral sacroiliac joint block Possible bilateral sacroiliac joint RFA Diagnosticbilateral intra-articular knee injection Diagnostic bilateral Hyalgan series Diagnostic bilateral genicular Nerve block Possible bilateral genicular RFA Diagnostic  bilateral ankle injections   Palliative PRN treatment(s):   Palliative (Midline) sacrococcygeal nerve block  Palliative(Midline) sacrococcygeal nerveRFA#2 Palliative bilateral bilateral lumbar facet blocks     Provider-requested follow-up: Return if symptoms worsen or fail to improve.  No future appointments. Primary Care Physician:  Donnie Coffin, MD Location: [facility] Note by: Vevelyn Francois NP Date: 10/11/2018; Time: 1:56 PM  Pain Score Disclaimer: We use the NRS-11 scale. This is a self-reported, subjective measurement of pain severity with only modest accuracy. It is used primarily to identify changes within a particular patient. It must be understood that outpatient pain scales are significantly less accurate that those used for research, where they can be applied under ideal controlled circumstances with minimal exposure to variables. In reality, the score is likely to be a combination of pain intensity and pain affect, where pain affect describes the degree of emotional arousal or changes in action readiness caused by the sensory experience of pain. Factors such as social and work situation, setting, emotional state, anxiety levels, expectation, and prior pain experience may influence pain perception and show large inter-individual differences that may also be affected by time variables.  Patient instructions provided during this appointment: Patient Instructions   ____________________________________________________________________________________________  Medication Rules  Applies to: All patients receiving prescriptions (written or electronic).  Pharmacy of record: Pharmacy where electronic prescriptions will be sent. If written prescriptions are taken to a different pharmacy, please inform the nursing staff. The pharmacy listed in the electronic medical record should be the one where you would like electronic prescriptions to be sent.  Prescription refills: Only during scheduled appointments. Applies to both, written and electronic prescriptions.  NOTE: The following applies primarily to controlled substances (Opioid* Pain Medications).   Patient's responsibilities: 1. Pain Pills: Bring all pain pills to every appointment (except for procedure appointments). 2. Pill Bottles:  Bring pills in original pharmacy bottle. Always bring newest bottle. Bring bottle, even if empty. 3. Medication refills: You are responsible for knowing and keeping track of what medications you need refilled. The day before your appointment, write a list of all prescriptions that need to be refilled. Bring that list to your appointment and give it to the admitting nurse. Prescriptions will be written only during appointments. If you forget a medication, it will not be "Called in", "Faxed", or "electronically sent". You will need to get another appointment to get these prescribed. 4. Prescription Accuracy: You are responsible for carefully inspecting your prescriptions before leaving our office. Have the discharge nurse carefully go over each prescription with you, before taking them home. Make sure that your name is accurately spelled, that your address is correct. Check the name and dose of your medication to make sure it is accurate. Check the number of pills, and the written instructions to make sure they are clear and accurate. Make sure that you are given enough medication to last until your next medication refill appointment. 5. Taking Medication: Take medication as prescribed. Never take more pills than instructed. Never take medication more frequently than prescribed. Taking less pills or less frequently is permitted and encouraged, when it comes to controlled substances (written prescriptions).  6. Inform other Doctors: Always inform, all of your healthcare providers, of all the medications you take. 7. Pain Medication from other Providers: You are not allowed to accept any additional pain medication from any other Doctor or Healthcare provider. There are two exceptions to this rule. (see below) In the event that you require  additional pain medication, you are responsible for notifying us, as stated below. 8. Medication Agreement: You are responsible for carefully reading and following our Medication  Agreement. This must be signed before receiving any prescriptions from our practice. Safely store a copy of your signed Agreement. Violations to the Agreement will result in no further prescriptions. (Additional copies of our Medication Agreement are available upon request.) 9. Laws, Rules, & Regulations: All patients are expected to follow all Federal and [employer], [employer], Rules, [employer]. Ignorance of the Laws does not constitute a valid excuse. The use of any illegal substances is prohibited. 10. Adopted CDC guidelines & recommendations: Target dosing levels will be at or below 60 MME/day. Use of benzodiazepines** is not recommended.  Exceptions: There are only two exceptions to the rule of not receiving pain medications from other Healthcare Providers. 1. Exception #1 (Emergencies): In the event of an emergency (i.e.: accident requiring emergency care), you are allowed to receive additional pain medication. However, you are responsible for: As soon as you are able, call our office [phone], at any time of the day or night, and leave a message stating your name, the date and nature of the emergency, and the name and dose of the medication prescribed. In the event that your call is answered by a member of our staff, make sure to document and save the date, time, and the name of the person that took your information.  2. Exception #2 (Planned Surgery): In the event that you are scheduled by another doctor or dentist to have any type of surgery or procedure, you are allowed (for a period no longer than 30 days), to receive additional pain medication, for the acute post-op pain. However, in this case, you are responsible for picking up a copy of our "Post-op Pain Management for Surgeons" handout, and giving it to your surgeon or dentist. This document is available at our office, and does not require an appointment to obtain it. Simply go to our office during business hours (Monday-Thursday from  8:00 AM to 4:00 PM) (Friday 8:00 AM to 12:00 Noon) or if you have a scheduled appointment with [place], prior to your surgery, and ask for it by name. In addition, you will need to provide [place] with your name, name of your surgeon, type of surgery, and date of procedure or surgery.  *Opioid medications include: morphine, codeine, oxycodone, oxymorphone, hydrocodone, hydromorphone, meperidine, tramadol, tapentadol, buprenorphine, fentanyl, methadone. **Benzodiazepine medications include: diazepam (Valium), alprazolam (Xanax), clonazepam (Klonopine), lorazepam (Ativan), clorazepate (Tranxene), chlordiazepoxide (Librium), estazolam (Prosom), oxazepam (Serax), temazepam (Restoril), triazolam (Halcion) (Last updated: 01/27/2018) ____________________________________________________________________________________________   BMI Assessment: Estimated body mass index is 36.58 kg/m as calculated from the following:   Height as of this encounter: '5\' 2"'  (1.575 m).   Weight as of this encounter: 200 lb (90.7 kg).  BMI interpretation table: BMI level Category Range association with higher incidence of chronic pain  <18 kg/m2 Underweight   18.5-24.9 kg/m2 Ideal body weight   25-29.9 kg/m2 Overweight Increased incidence by 20%  30-34.9 kg/m2 Obese (Class I) Increased incidence by 68%  35-39.9 kg/m2 Severe obesity (Class II) Increased incidence by 136%  >40 kg/m2 Extreme obesity (Class III) Increased incidence by 254%   Patient's current BMI Ideal Body weight  Body mass index is 36.58 kg/m. Ideal body weight: 50.1 kg (110 lb 7.2 oz) Adjusted ideal body weight: 66.3 kg (146 lb 4.3 oz)   BMI Readings from Last 4 Encounters:  10/11/18 36.58 kg/m  08/17/18  36.58 kg/m  07/28/18 37.13 kg/m  07/12/18 37.13 kg/m   Wt Readings from Last 4 Encounters:  10/11/18 200 lb (90.7 kg)  08/17/18 200 lb (90.7 kg)  07/28/18 203 lb (92.1 kg)  07/12/18 203 lb (92.1 kg)

## 2018-10-11 NOTE — Patient Instructions (Addendum)
____________________________________________________________________________________________  Medication Rules  Applies to: All patients receiving prescriptions (written or electronic).  Pharmacy of record: Pharmacy where electronic prescriptions will be sent. If written prescriptions are taken to a different pharmacy, please inform the nursing staff. The pharmacy listed in the electronic medical record should be the one where you would like electronic prescriptions to be sent.  Prescription refills: Only during scheduled appointments. Applies to both, written and electronic prescriptions.  NOTE: The following applies primarily to controlled substances (Opioid* Pain Medications).   Patient's responsibilities: 1. Pain Pills: Bring all pain pills to every appointment (except for procedure appointments). 2. Pill Bottles: Bring pills in original pharmacy bottle. Always bring newest bottle. Bring bottle, even if empty. 3. Medication refills: You are responsible for knowing and keeping track of what medications you need refilled. The day before your appointment, write a list of all prescriptions that need to be refilled. Bring that list to your appointment and give it to the admitting nurse. Prescriptions will be written only during appointments. If you forget a medication, it will not be "Called in", "Faxed", or "electronically sent". You will need to get another appointment to get these prescribed. 4. Prescription Accuracy: You are responsible for carefully inspecting your prescriptions before leaving our office. Have the discharge nurse carefully go over each prescription with you, before taking them home. Make sure that your name is accurately spelled, that your address is correct. Check the name and dose of your medication to make sure it is accurate. Check the number of pills, and the written instructions to make sure they are clear and accurate. Make sure that you are given enough medication to last  until your next medication refill appointment. 5. Taking Medication: Take medication as prescribed. Never take more pills than instructed. Never take medication more frequently than prescribed. Taking less pills or less frequently is permitted and encouraged, when it comes to controlled substances (written prescriptions).  6. Inform other Doctors: Always inform, all of your healthcare providers, of all the medications you take. 7. Pain Medication from other Providers: You are not allowed to accept any additional pain medication from any other Doctor or Healthcare provider. There are two exceptions to this rule. (see below) In the event that you require additional pain medication, you are responsible for notifying us, as stated below. 8. Medication Agreement: You are responsible for carefully reading and following our Medication Agreement. This must be signed before receiving any prescriptions from our practice. Safely store a copy of your signed Agreement. Violations to the Agreement will result in no further prescriptions. (Additional copies of our Medication Agreement are available upon request.) 9. Laws, Rules, & Regulations: All patients are expected to follow all Federal and State Laws, Statutes, Rules, & Regulations. Ignorance of the Laws does not constitute a valid excuse. The use of any illegal substances is prohibited. 10. Adopted CDC guidelines & recommendations: Target dosing levels will be at or below 60 MME/day. Use of benzodiazepines** is not recommended.  Exceptions: There are only two exceptions to the rule of not receiving pain medications from other Healthcare Providers. 1. Exception #1 (Emergencies): In the event of an emergency (i.e.: accident requiring emergency care), you are allowed to receive additional pain medication. However, you are responsible for: As soon as you are able, call our office [phone], at any time of the day or night, and leave a message stating your name, the  date and nature of the emergency, and the name and dose of the medication   prescribed. In the event that your call is answered by a member of our staff, make sure to document and save the date, time, and the name of the person that took your information.  2. Exception #2 (Planned Surgery): In the event that you are scheduled by another doctor or dentist to have any type of surgery or procedure, you are allowed (for a period no longer than 30 days), to receive additional pain medication, for the acute post-op pain. However, in this case, you are responsible for picking up a copy of our "Post-op Pain Management for Surgeons" handout, and giving it to your surgeon or dentist. This document is available at our office, and does not require an appointment to obtain it. Simply go to our office during business hours (Monday-Thursday from 8:00 AM to 4:00 PM) (Friday 8:00 AM to 12:00 Noon) or if you have a scheduled appointment with us, prior to your surgery, and ask for it by name. In addition, you will need to provide us with your name, name of your surgeon, type of surgery, and date of procedure or surgery.  *Opioid medications include: morphine, codeine, oxycodone, oxymorphone, hydrocodone, hydromorphone, meperidine, tramadol, tapentadol, buprenorphine, fentanyl, methadone. **Benzodiazepine medications include: diazepam (Valium), alprazolam (Xanax), clonazepam (Klonopine), lorazepam (Ativan), clorazepate (Tranxene), chlordiazepoxide (Librium), estazolam (Prosom), oxazepam (Serax), temazepam (Restoril), triazolam (Halcion) (Last updated: 01/27/2018) ____________________________________________________________________________________________   BMI Assessment: Estimated body mass index is 36.58 kg/m as calculated from the following:   Height as of this encounter: 5\' 2"  (1.575 m).   Weight as of this encounter: 200 lb (90.7 kg).  BMI interpretation table: BMI level Category Range association with higher  incidence of chronic pain  <18 kg/m2 Underweight   18.5-24.9 kg/m2 Ideal body weight   25-29.9 kg/m2 Overweight Increased incidence by 20%  30-34.9 kg/m2 Obese (Class I) Increased incidence by 68%  35-39.9 kg/m2 Severe obesity (Class II) Increased incidence by 136%  >40 kg/m2 Extreme obesity (Class III) Increased incidence by 254%   Patient's current BMI Ideal Body weight  Body mass index is 36.58 kg/m. Ideal body weight: 50.1 kg (110 lb 7.2 oz) Adjusted ideal body weight: 66.3 kg (146 lb 4.3 oz)   BMI Readings from Last 4 Encounters:  10/11/18 36.58 kg/m  08/17/18 36.58 kg/m  07/28/18 37.13 kg/m  07/12/18 37.13 kg/m   Wt Readings from Last 4 Encounters:  10/11/18 200 lb (90.7 kg)  08/17/18 200 lb (90.7 kg)  07/28/18 203 lb (92.1 kg)  07/12/18 203 lb (92.1 kg)

## 2018-11-18 ENCOUNTER — Ambulatory Visit: Payer: Self-pay | Attending: Pain Medicine | Admitting: Physical Therapy

## 2018-11-18 ENCOUNTER — Encounter: Payer: Self-pay | Admitting: Physical Therapy

## 2018-11-18 DIAGNOSIS — M545 Low back pain, unspecified: Secondary | ICD-10-CM

## 2018-11-18 DIAGNOSIS — M6281 Muscle weakness (generalized): Secondary | ICD-10-CM | POA: Insufficient documentation

## 2018-11-18 DIAGNOSIS — G8929 Other chronic pain: Secondary | ICD-10-CM | POA: Insufficient documentation

## 2018-11-18 DIAGNOSIS — R262 Difficulty in walking, not elsewhere classified: Secondary | ICD-10-CM | POA: Insufficient documentation

## 2018-11-18 NOTE — Therapy (Addendum)
[facility] [address] [place], [place], [address] Phone: [phone]   Fax:  [phone]  Physical Therapy Treatment  Patient Details  Name: Sandra Strong MRN: [id number] Date of Birth: 10/16/1956 Referring Provider (PT): Dr. Laban EmperorNaveira   Encounter Date: 11/18/2018  PT End of Session - 11/19/18 1010    Visit Number  1    Number of Visits  1    Date for PT Re-Evaluation  11/19/18    PT Start Time  0824    PT Stop Time  0952    PT Time Calculation (min)  88 min    Activity Tolerance  Patient tolerated treatment well;Patient limited by pain    Behavior During Therapy  [facility] for tasks assessed/performed       Past Medical History:  Diagnosis Date  . Anxiety   . Arthritis   . Asthma   . COPD (chronic obstructive pulmonary disease) (HCC)   . GERD (gastroesophageal reflux disease)   . Hypertension     Past Surgical History:  Procedure Laterality Date  . ABDOMINAL HYSTERECTOMY    . carpel tunnel Bilateral   . FOOT SURGERY Left   . HERNIA REPAIR    . ROTATOR CUFF REPAIR Right     There were no vitals filed for this visit.  Subjective Assessment - 11/18/18 0823    Subjective  Pt. denied disability in 12/2017 for chronic pain.  Pt. states she has been exercising at home since she has stopped working in November.  Pt. lives with son in a mobile home with 10 steps to enter.   Pt. likes to fish/ crochet/ jigsaw puzzles/ play with her cats.       Pertinent History  Pt. retired from [employer] on 10/04/18.  Pt. worked 12 hour shifts with a lot of standing/ lifting.      Patient Stated Goals  FCE only.      Currently in Pain?  Yes    Pain Score  4     Pain Location  Back    Pain Orientation  Right;Lower    Pain Descriptors / Indicators  Aching    Pain Type  Chronic pain                   Plan - 11/19/18 1012    Clinical Impression Statement  Overall Level of Work: Falls within the Light range.  Exerting up to 20 pounds  of force occasionally, and/or up to 10 pounds of force frequently, and/or a negligible amount of force constantly (Constantly: activity or condition exist 2/3 or more of the time) to move objects.  Physical demand requirements are in excess of those for Sedentary Work.  Even though the weight lifted may be only a negligible amount, a job should be rated Light Work: (1) when it requires walking or standing to significant degree; or (2) when it requires sitting most of the time but entails pushing and/or pulling of arm or leg controls; and/or (3) when the job requires working at a production rate pace entailing the constant pushing and/or pulling of materials even though the weight of those materials is negligible.  NOTE: The constant stress and strain of maintaining a production rate pace, especially in an industrial setting, can be and is physically demanding of a worker even though the amount of force exerted is negligible.  Please note that the dynamic strength/manual materials handling section of the report indicates a higher level of work than  that determined by considering the client's performance on the entire test.  Our research shows that the safe, overall level of work is significantly influenced by non-materials handling (i.e. position tolerance and mobility) abilities.  To ignore these non-materials handling demands, negatively impacts the validity of the test.    Please see the Task Performance Table for specific abilities.   Tolerance for the 8-Hour Day: Due to the client's limited ability to walk and stand, she would have to alternate between standing, walking and other tasks as listed in the task performance table to be able to tolerate the Light level of work for the 8-hour day/40-hour week.        Clinical Presentation  Evolving    Clinical Decision Making  Moderate    Rehab Potential  Fair    PT Frequency  1x / week    PT Treatment/Interventions  Neuromuscular re-education;Balance  training;Therapeutic exercise;Therapeutic activities;Patient/family education;Functional mobility training    PT Next Visit Plan  FCE only       Patient will benefit from skilled therapeutic intervention in order to improve the following deficits and impairments:  Abnormal gait, Pain, Decreased activity tolerance, Decreased endurance, Decreased range of motion, Decreased strength, Obesity, Difficulty walking, Decreased balance  Visit Diagnosis: Chronic bilateral low back pain, unspecified whether sciatica present  Muscle weakness (generalized)  Difficulty walking     Problem List Patient Active Problem List   Diagnosis Date Noted  . Chronic hip pain (Left) 08/17/2018  . Lumbar spondylosis 07/11/2018  . Acute postoperative pain 05/05/2018  . Chronic sacroiliac joint pain (Bilateral) (R>L) 02/16/2018  . Spondylosis without myelopathy or radiculopathy, lumbosacral region 02/16/2018  . Lumbar facet syndrome (Bilateral) (R>L) 02/16/2018  . Other specified dorsopathies, sacral and sacrococcygeal region 02/16/2018  . Levoscoliosis 02/16/2018  . Lumbar facet arthropathy 02/16/2018  . 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 Osteoarthritis of lumbar spine 02/16/2018  . Osteoarthritis of ankle (Left) 02/16/2018  . DDD (degenerative disc disease), lumbar 02/16/2018  . GERD (gastroesophageal reflux disease) 02/16/2018  . Muscle cramps 02/16/2018  . 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  . Coccygodynia 02/16/2018  . Sacrococcygeal pain 02/16/2018  . Spondylosis without myelopathy or radiculopathy, sacral and sacrococcygeal region 02/16/2018  . Elevated C-reactive protein (CRP) 01/10/2018  . Chronic low back pain (Primary Area of Pain) (Bilateral) (R>L) 01/06/2018  . Chronic ankle pain (Secondary Area of Pain) (Bilateral) (R>L) 01/06/2018  . Chronic knee pain [facility](Tertiary Area of Pain) (Bilateral) (L>R) 01/06/2018  . Chronic pain syndrome  01/06/2018  . Opiate use 01/06/2018  . Pharmacologic therapy 01/06/2018  . Disorder of skeletal system 01/06/2018  . Problems influencing health status 01/06/2018   Cammie McgeeMichael C Adair Lauderback, PT, DPT # [id number] 11/19/2018, 10:13 AM  [facility] [address] [place], [place], [address] Phone: [phone]   Fax:  [phone]  Name: Sandra Strong MRN: [id number] Date of Birth: 09/17/1956

## 2018-11-19 NOTE — Addendum Note (Signed)
Addended by: Dorene GrebeSHERK, Mirna Sutcliffe C on: 11/19/2018 10:20 AM   Modules accepted: Orders

## 2018-11-28 ENCOUNTER — Other Ambulatory Visit: Payer: Self-pay | Admitting: Pain Medicine

## 2018-12-20 ENCOUNTER — Telehealth: Payer: Self-pay | Admitting: *Deleted

## 2018-12-20 NOTE — Telephone Encounter (Signed)
Attempted to contact patient r/t LDCT Screening follow up due at this time.  No answer received, message left for patient to call [phone] to schedule appointment.    

## 2018-12-21 ENCOUNTER — Telehealth: Payer: Self-pay | Admitting: *Deleted

## 2018-12-21 NOTE — Telephone Encounter (Signed)
Attempted to contact patient r/t LDCT Screening follow up due at this time.  No answer received, message left for patient to call [phone] to schedule appointment.    

## 2018-12-22 ENCOUNTER — Telehealth: Payer: Self-pay | Admitting: *Deleted

## 2018-12-22 ENCOUNTER — Encounter: Payer: Self-pay | Admitting: *Deleted

## 2018-12-22 DIAGNOSIS — Z122 Encounter for screening for malignant neoplasm of respiratory organs: Secondary | ICD-10-CM

## 2018-12-22 NOTE — Telephone Encounter (Signed)
Patient has been notified that the annual lung cancer screening low dose CT scan is due currently or will be in the near future.  Confirmed that the patient is within the age range of 32-80, and asymptomatic, and currently exhibits no signs or symptoms of lung cancer.  Patient denies illness that would prevent curative treatment for lung cancer if found.  Verified smoking history, CURRENT SMOKER 1.5PPD WITH 131PKYR HISTORY.  The shared decision making visit was completed on 12-28-17.  Patient is agreeable for the CT scan to be scheduled.  Will call patient back with date and time of appointment.

## 2018-12-23 ENCOUNTER — Telehealth: Payer: Self-pay | Admitting: *Deleted

## 2018-12-23 NOTE — Telephone Encounter (Signed)
Called pt to inform her of her appt for ldct screening on Friday 12/30/2018 @ 10:10am here @ [facility], ,message left for patient and appt mailed to patient per address in emr.

## 2018-12-30 ENCOUNTER — Ambulatory Visit
Admission: RE | Admit: 2018-12-30 | Discharge: 2018-12-30 | Disposition: A | Discharge status: Home / Self Care | Payer: Self-pay | Source: Ambulatory Visit | Attending: Nurse Practitioner | Admitting: Nurse Practitioner

## 2018-12-30 ENCOUNTER — Encounter: Payer: Self-pay | Admitting: *Deleted

## 2018-12-30 DIAGNOSIS — Z122 Encounter for screening for malignant neoplasm of respiratory organs: Secondary | ICD-10-CM | POA: Insufficient documentation

## 2019-02-06 IMAGING — CR DG ANKLE COMPLETE 3+V*R*
1 series · 3 of 3 positions shown · non-contrast
Comparison: None.

CLINICAL DATA: Ankle pain, no injury

EXAM:
RIGHT ANKLE - COMPLETE 3+ VIEW

[Series 1: dg ankle complete right · 0.14mm/px · 3 of 3 slices shown]
[im 1/3]
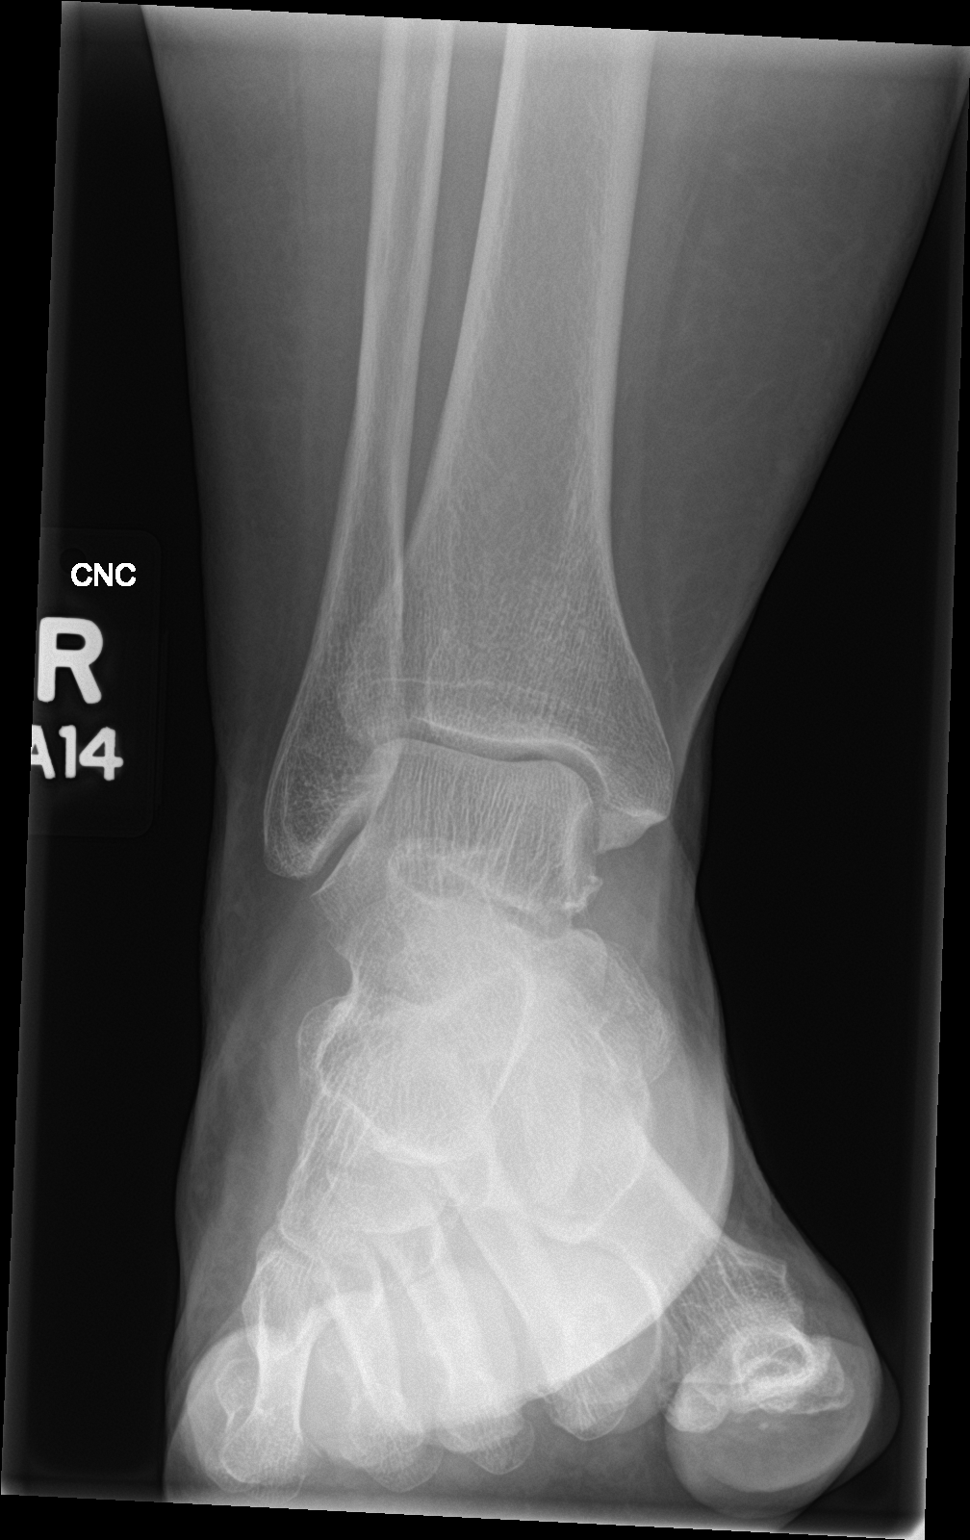
[im 2/3]
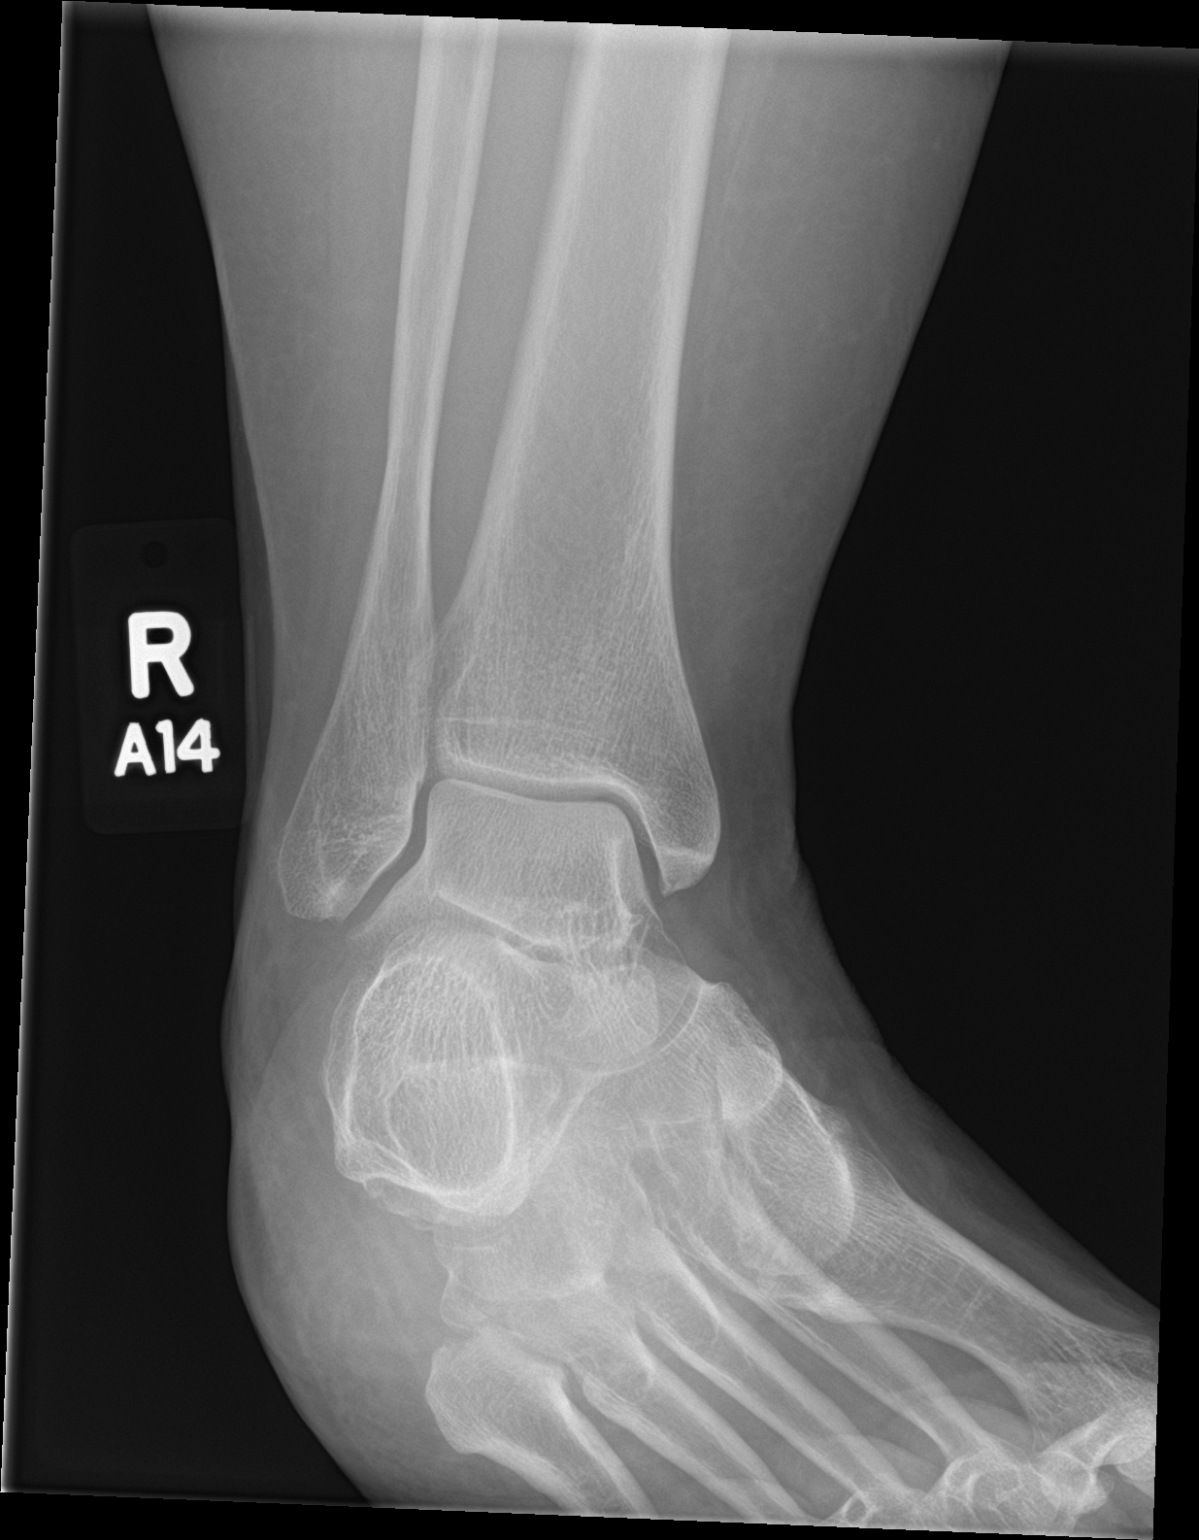
[im 3/3]
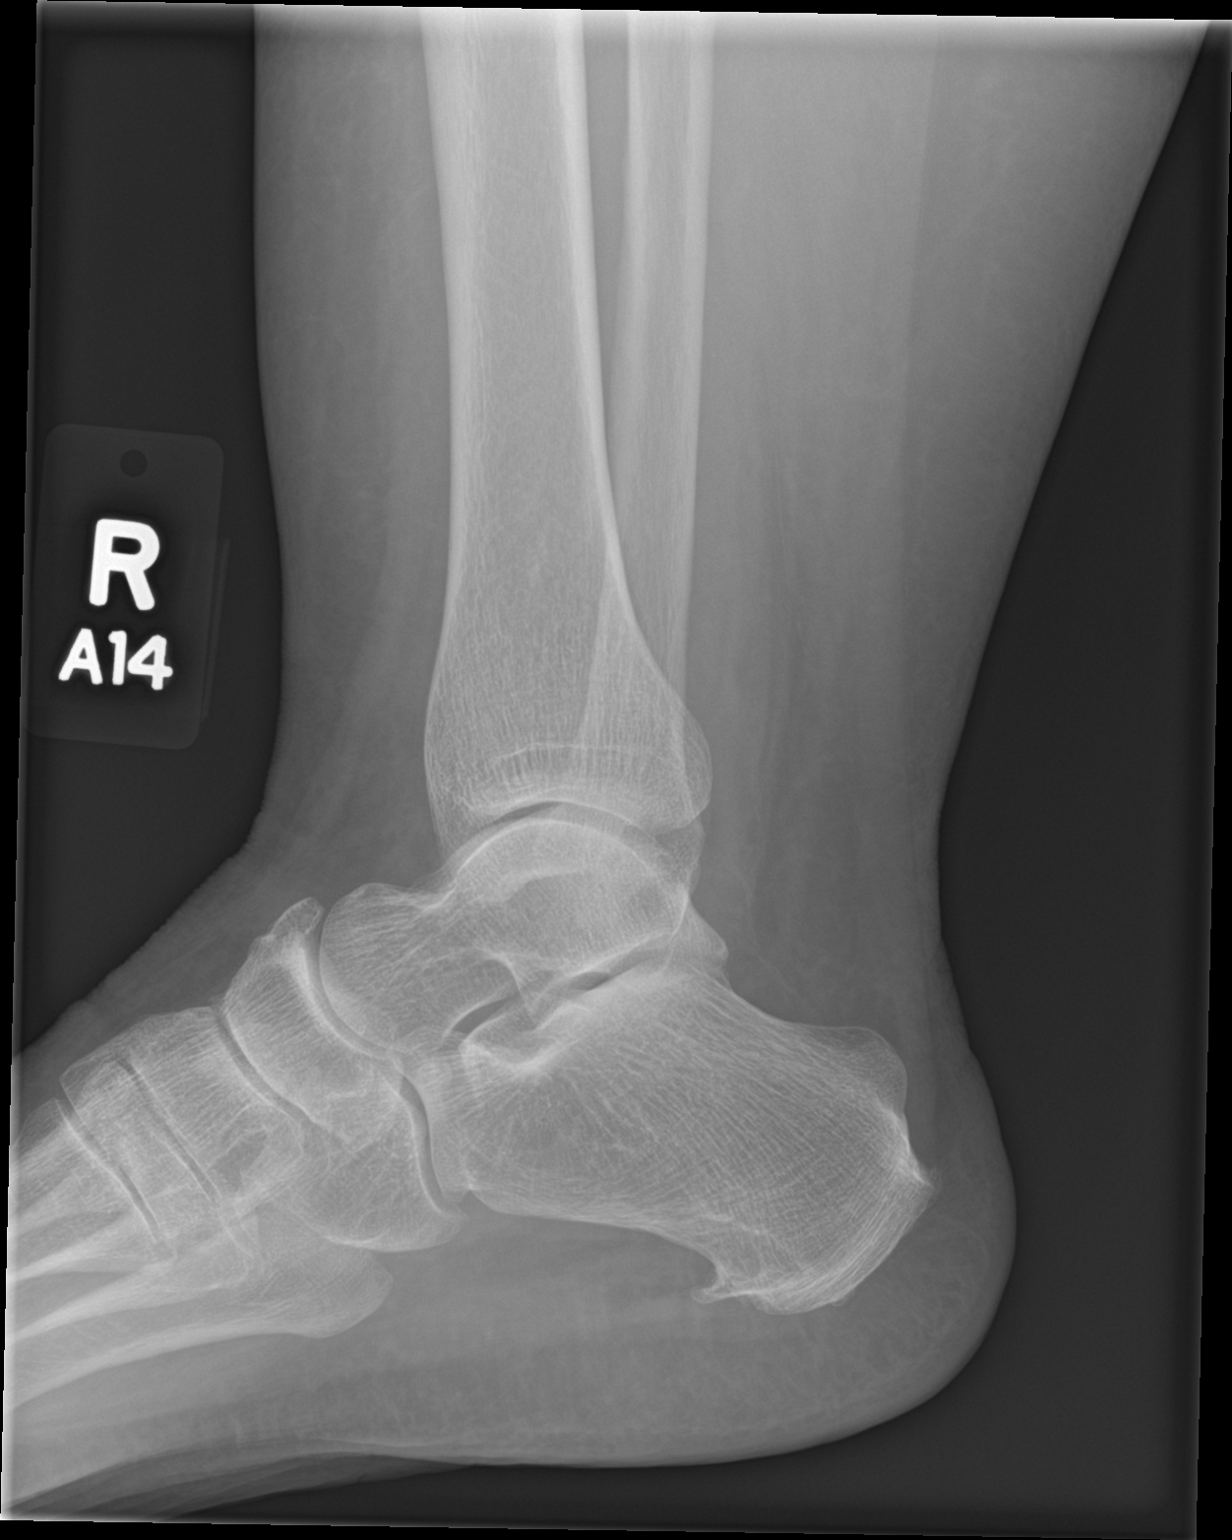

[3 of 3 positions shown; findings below may reference images not displayed]

FINDINGS: The right ankle joint appears normal. Alignment is normal. Only mild
degenerative spurring is noted from the medial malleolus. No
fracture is seen. There is a plantar calcaneal degenerative spur
present. Some spurring is noted from the tarsal navicula on the
dorsal aspect as well.
IMPRESSION: No acute abnormality.  Mild degenerative change as noted above.

## 2019-02-06 IMAGING — CR DG ANKLE COMPLETE 3+V*L*
1 series · 3 of 3 positions shown · non-contrast
Comparison: None.

CLINICAL DATA: Ankle pain, no acute injury

EXAM:
LEFT ANKLE COMPLETE - 3+ VIEW

[Series 1: dg ankle complete left · 0.14mm/px · 3 of 3 slices shown]
[im 1/3]
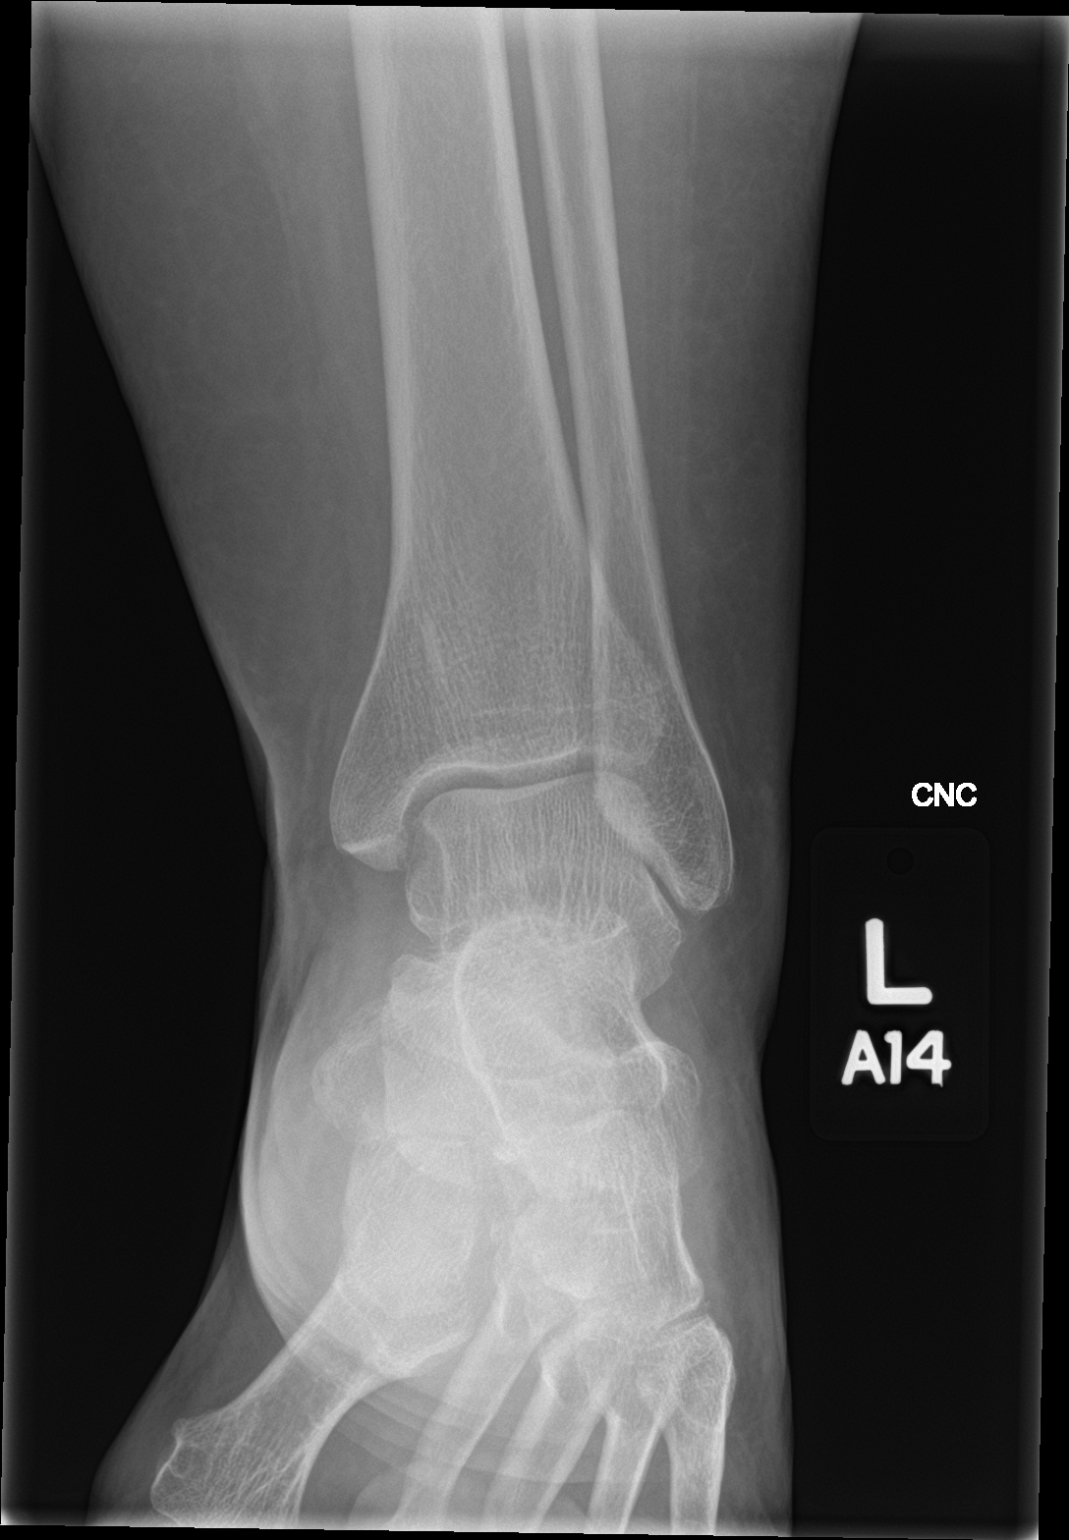
[im 2/3]
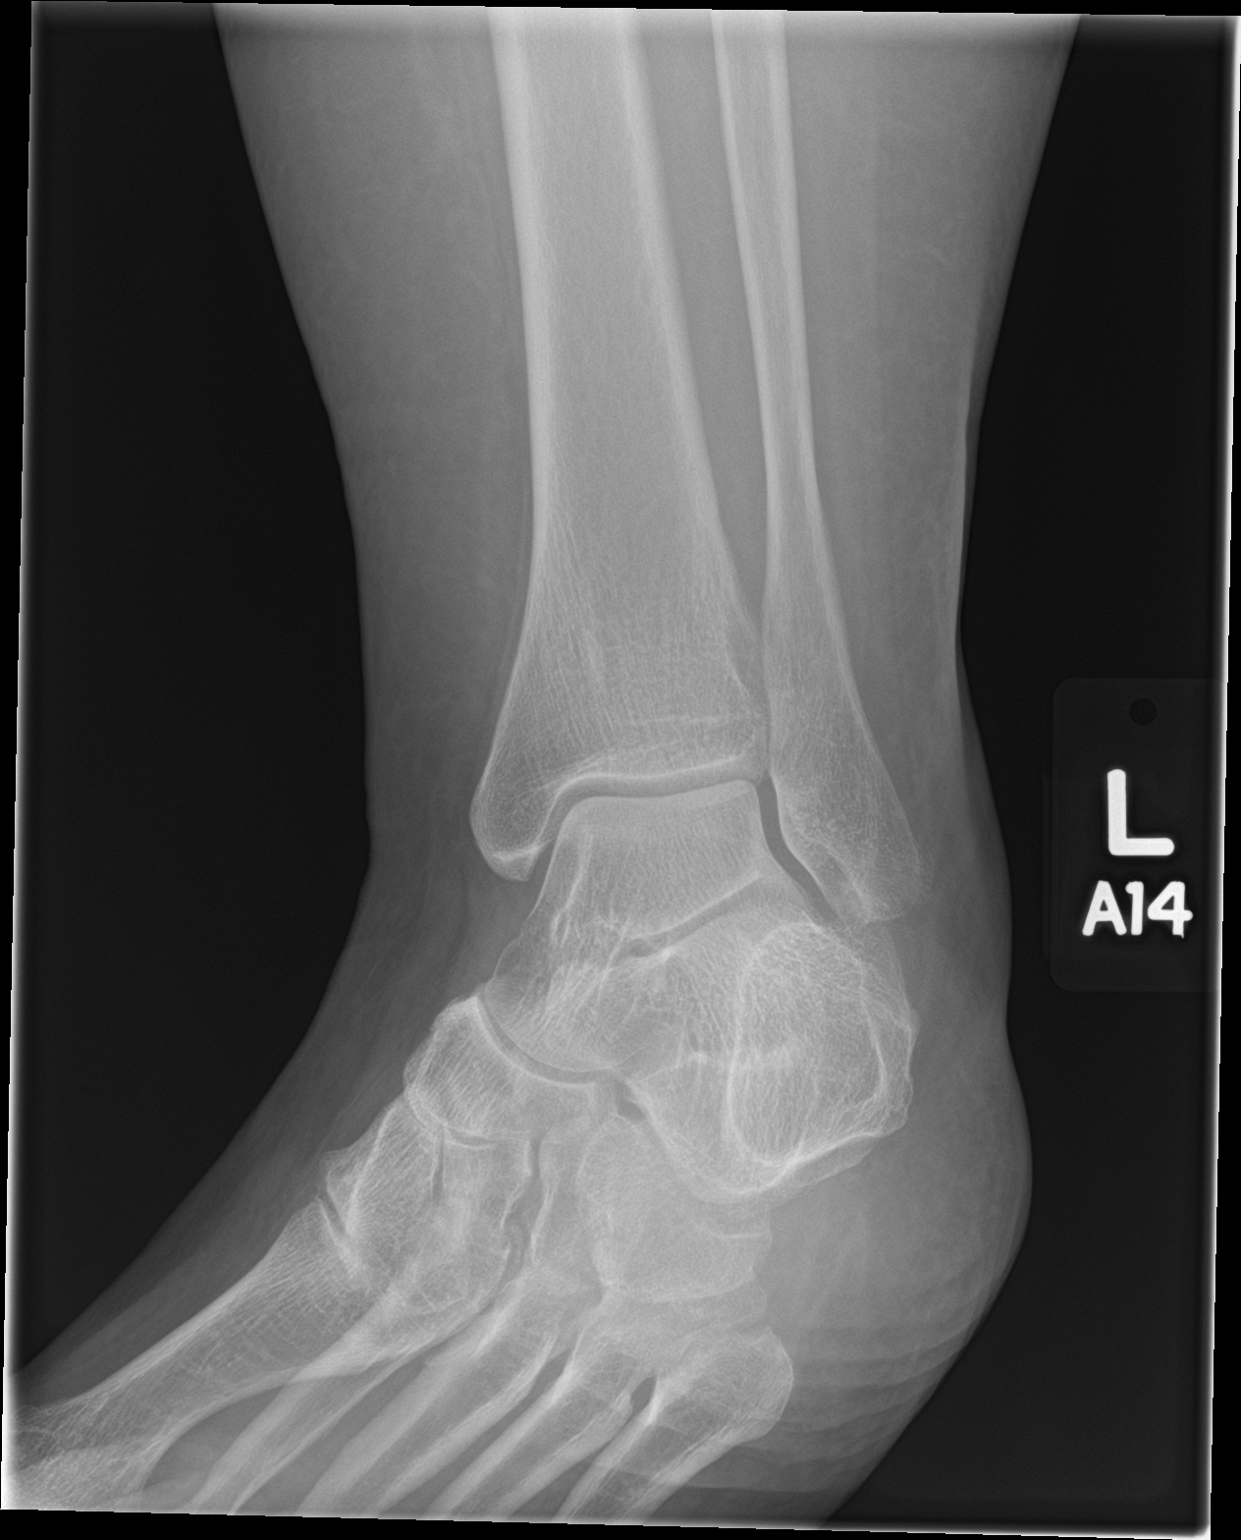
[im 3/3]
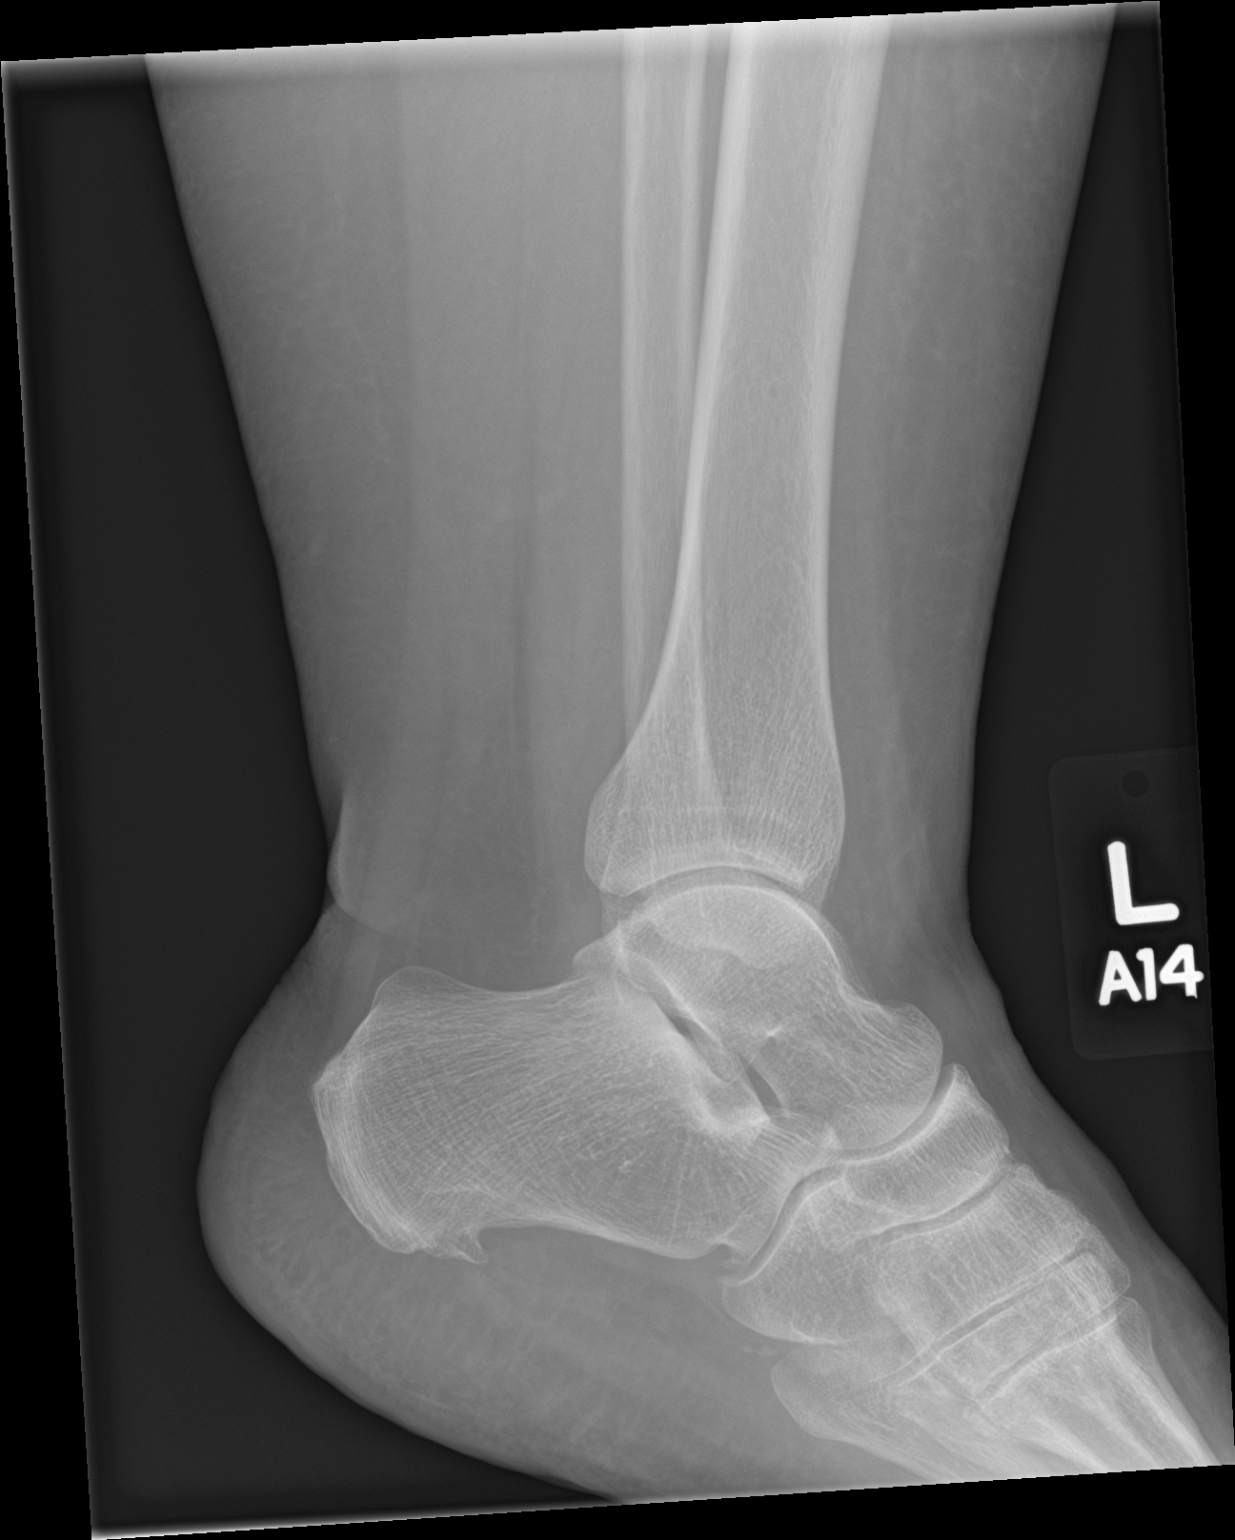

[3 of 3 positions shown; findings below may reference images not displayed]

FINDINGS: The left ankle joint appears normal. Alignment is normal. No
fracture is seen. A plantar calcaneal degenerative spur is present.
IMPRESSION: No acute abnormality.  Small plantar calcaneal degenerative spur.

## 2019-04-17 ENCOUNTER — Ambulatory Visit: Payer: Self-pay | Admitting: *Deleted

## 2019-04-17 DIAGNOSIS — Z20822 Contact with and (suspected) exposure to covid-19: Secondary | ICD-10-CM

## 2019-04-17 NOTE — Telephone Encounter (Signed)
Dr. Hillery Aldo called regarding testing for her patient, Sandra Strong. This patient has chronic conditions, COPD and has had increase in the last 24 hours of sinus congestion, slight chills, subjective fever and lives with a son that is a Psychiatric nurse. Pt notify patient and schedule an appointment.  Pt was notified regarding an appointment for covid-19 testing. Her appointment is tomorrow 04/18/19 (per pt request), at 9 am at the [facility] building in [place]. Pt advised to wear a mask, stay in the care with the windows rolled up until time to be tested.  Pt voiced understanding.

## 2019-04-18 ENCOUNTER — Other Ambulatory Visit: Payer: Self-pay

## 2019-04-18 DIAGNOSIS — Z20822 Contact with and (suspected) exposure to covid-19: Secondary | ICD-10-CM

## 2019-04-18 NOTE — Telephone Encounter (Signed)
Dr. Allena Katz called to verify if the patient was tested, I advised of the appointment today at 0900 and it's still showing active in Epic, so there is no way to know if she's been tested or not. She verbalized understanding.

## 2019-04-20 LAB — NOVEL CORONAVIRUS, NAA: SARS-CoV-2, NAA: NOT DETECTED

## 2019-06-07 ENCOUNTER — Telehealth: Payer: Self-pay | Admitting: Internal Medicine

## 2019-06-07 DIAGNOSIS — J449 Chronic obstructive pulmonary disease, unspecified: Secondary | ICD-10-CM

## 2019-06-07 DIAGNOSIS — R0602 Shortness of breath: Secondary | ICD-10-CM

## 2019-06-07 NOTE — Telephone Encounter (Signed)
Spoke with pt re referral request to [facility]. Pt states that she in the middle of trying to get disability and is having financial issues. She has qualified for financial aid that pays 100% of her medical at [facility]. Referral placed at pt request.

## 2019-08-15 DIAGNOSIS — M461 Sacroiliitis, not elsewhere classified: Secondary | ICD-10-CM | POA: Insufficient documentation

## 2019-12-28 ENCOUNTER — Telehealth: Payer: Self-pay | Admitting: *Deleted

## 2019-12-28 NOTE — Telephone Encounter (Signed)
Contacted regarding lung screening scan being due. Patient reports she will have lung screening at [facility] where she gets charity care. I instructed her to contact me if she wants to consider lung screening at [facility] in the future.

## 2020-12-19 ENCOUNTER — Ambulatory Visit: Payer: Self-pay | Admitting: Internal Medicine

## 2021-05-07 ENCOUNTER — Other Ambulatory Visit: Payer: Self-pay | Admitting: Family Medicine

## 2021-05-07 ENCOUNTER — Ambulatory Visit
Admission: RE | Admit: 2021-05-07 | Discharge: 2021-05-07 | Disposition: A | Discharge status: Home / Self Care | Payer: Medicare Other | Attending: Family Medicine | Admitting: Family Medicine

## 2021-05-07 ENCOUNTER — Ambulatory Visit
Admission: RE | Admit: 2021-05-07 | Discharge: 2021-05-07 | Disposition: A | Discharge status: Home / Self Care | Payer: Medicare Other | Source: Ambulatory Visit | Attending: Family Medicine | Admitting: Family Medicine

## 2021-05-07 DIAGNOSIS — R0781 Pleurodynia: Secondary | ICD-10-CM

## 2023-01-20 ENCOUNTER — Other Ambulatory Visit: Payer: Self-pay | Admitting: Family Medicine

## 2023-01-20 DIAGNOSIS — Z1231 Encounter for screening mammogram for malignant neoplasm of breast: Secondary | ICD-10-CM

## 2023-02-10 ENCOUNTER — Ambulatory Visit
Admission: RE | Admit: 2023-02-10 | Discharge: 2023-02-10 | Disposition: A | Discharge status: Home / Self Care | Payer: Medicare (Managed Care) | Source: Ambulatory Visit | Attending: Family Medicine | Admitting: Family Medicine

## 2023-02-10 DIAGNOSIS — Z1231 Encounter for screening mammogram for malignant neoplasm of breast: Secondary | ICD-10-CM

## 2023-03-01 ENCOUNTER — Telehealth: Payer: Self-pay | Admitting: *Deleted

## 2023-03-01 NOTE — Telephone Encounter (Signed)
Spoke with pt regarding scheduling follow up lung screening CT. Pt is changing to new insurance on 03/08/23 and would like to wait until after that to schedule. Pt will call us back once she gets this new info.

## 2023-03-10 ENCOUNTER — Other Ambulatory Visit: Payer: Self-pay | Admitting: *Deleted

## 2023-03-10 DIAGNOSIS — F17210 Nicotine dependence, cigarettes, uncomplicated: Secondary | ICD-10-CM

## 2023-03-10 DIAGNOSIS — Z122 Encounter for screening for malignant neoplasm of respiratory organs: Secondary | ICD-10-CM

## 2023-03-10 DIAGNOSIS — Z87891 Personal history of nicotine dependence: Secondary | ICD-10-CM

## 2023-04-01 ENCOUNTER — Encounter: Payer: Self-pay | Admitting: Emergency Medicine

## 2023-04-01 ENCOUNTER — Emergency Department
Admission: EM | Admit: 2023-04-01 | Discharge: 2023-04-01 | Disposition: A | Discharge status: Home / Self Care | Payer: Medicare HMO | Attending: Emergency Medicine | Admitting: Emergency Medicine

## 2023-04-01 ENCOUNTER — Emergency Department: Payer: Medicare HMO

## 2023-04-01 DIAGNOSIS — R1030 Lower abdominal pain, unspecified: Secondary | ICD-10-CM | POA: Diagnosis present

## 2023-04-01 DIAGNOSIS — R000 Tachycardia, unspecified: Secondary | ICD-10-CM | POA: Insufficient documentation

## 2023-04-01 DIAGNOSIS — D72829 Elevated white blood cell count, unspecified: Secondary | ICD-10-CM | POA: Insufficient documentation

## 2023-04-01 DIAGNOSIS — N3000 Acute cystitis without hematuria: Secondary | ICD-10-CM | POA: Insufficient documentation

## 2023-04-01 LAB — CBC WITH DIFFERENTIAL/PLATELET
Abs Immature Granulocytes: 0.06 10*3/uL (ref 0.00–0.07)
Basophils Absolute: 0.1 10*3/uL (ref 0.0–0.1)
Basophils Relative: 1 %
Eosinophils Absolute: 0.2 10*3/uL (ref 0.0–0.5)
Eosinophils Relative: 1 %
HCT: 41.2 % (ref 36.0–46.0)
Hemoglobin: 13.4 g/dL (ref 12.0–15.0)
Immature Granulocytes: 1 %
Lymphocytes Relative: 16 %
Lymphs Abs: 2 10*3/uL (ref 0.7–4.0)
MCH: 29.5 pg (ref 26.0–34.0)
MCHC: 32.5 g/dL (ref 30.0–36.0)
MCV: 90.7 fL (ref 80.0–100.0)
Monocytes Absolute: 0.9 10*3/uL (ref 0.1–1.0)
Monocytes Relative: 7 %
Neutro Abs: 9.3 10*3/uL — ABNORMAL HIGH (ref 1.7–7.7)
Neutrophils Relative %: 74 %
Platelets: 318 10*3/uL (ref 150–400)
RBC: 4.54 MIL/uL (ref 3.87–5.11)
RDW: 14.8 % (ref 11.5–15.5)
WBC: 12.5 10*3/uL — ABNORMAL HIGH (ref 4.0–10.5)
nRBC: 0 % (ref 0.0–0.2)

## 2023-04-01 LAB — LACTIC ACID, PLASMA: Lactic Acid, Venous: 1.1 mmol/L (ref 0.5–1.9)

## 2023-04-01 LAB — LIPASE, BLOOD: Lipase: 36 U/L (ref 11–51)

## 2023-04-01 LAB — URINALYSIS, ROUTINE W REFLEX MICROSCOPIC
Bilirubin Urine: NEGATIVE
Glucose, UA: NEGATIVE mg/dL
Hgb urine dipstick: NEGATIVE
Ketones, ur: NEGATIVE mg/dL
Leukocytes,Ua: NEGATIVE
Nitrite: NEGATIVE
Protein, ur: NEGATIVE mg/dL
Specific Gravity, Urine: 1.024 (ref 1.005–1.030)
pH: 7 (ref 5.0–8.0)

## 2023-04-01 LAB — COMPREHENSIVE METABOLIC PANEL
ALT: 16 U/L (ref 0–44)
AST: 18 U/L (ref 15–41)
Albumin: 3.7 g/dL (ref 3.5–5.0)
Alkaline Phosphatase: 83 U/L (ref 38–126)
Anion gap: 9 (ref 5–15)
BUN: 18 mg/dL (ref 8–23)
CO2: 22 mmol/L (ref 22–32)
Calcium: 9.3 mg/dL (ref 8.9–10.3)
Chloride: 107 mmol/L (ref 98–111)
Creatinine, Ser: 0.71 mg/dL (ref 0.44–1.00)
GFR, Estimated: >60 mL/min (ref >60)
Glucose, Bld: 79 mg/dL (ref 70–99)
Potassium: 4.2 mmol/L (ref 3.5–5.1)
Sodium: 138 mmol/L (ref 135–145)
Total Bilirubin: 0.3 mg/dL (ref 0.3–1.2)
Total Protein: 6.9 g/dL (ref 6.5–8.1)

## 2023-04-01 MED ORDER — MORPHINE SULFATE (PF) 4 MG/ML IV SOLN
4.0000 mg | Freq: Once | INTRAVENOUS | Status: AC
  Administered 2023-04-01: 4 mg via INTRAVENOUS
  Filled 2023-04-01: qty 1

## 2023-04-01 MED ORDER — LACTATED RINGERS IV BOLUS
1000.0000 mL | Freq: Once | INTRAVENOUS | Status: AC
  Administered 2023-04-01: 1000 mL via INTRAVENOUS

## 2023-04-01 MED ORDER — ONDANSETRON HCL 4 MG/2ML IJ SOLN
4.0000 mg | INTRAMUSCULAR | Status: AC
  Administered 2023-04-01: 4 mg via INTRAVENOUS
  Filled 2023-04-01: qty 2

## 2023-04-01 MED ORDER — IOHEXOL 300 MG/ML  SOLN
100.0000 mL | Freq: Once | INTRAMUSCULAR | Status: AC | PRN
  Administered 2023-04-01: 100 mL via INTRAVENOUS

## 2023-04-01 MED ORDER — CEPHALEXIN 500 MG PO CAPS: 500.0000 mg | ORAL_CAPSULE | Freq: Three times a day (TID) | ORAL | 0 refills | Status: DC

## 2023-04-01 NOTE — ED Provider Notes (Signed)
[facility] Provider Note    Event Date/Time   First MD Initiated Contact with Patient 04/01/23 [phone]     (approximate)   History   Abdominal Pain   HPI Sandra Strong is a 67 y.o. female who presents for evaluation of lower abdominal pain mostly on the left side.  She has been having this discomfort for less than 24 hours.  She has a history of IBS and wonders if it could be that.  She has had no nausea or vomiting.  She denies chest pain or shortness of breath as well as fever and diarrhea.  She has had no increased urinary frequency nor pain with urinates.  No hematuria.  She has an extensive history of abdominal surgeries but she describes the current pain she is feeling as "burning" pain in the lower abdomen.     Physical Exam   Triage Vital Signs: ED Triage Vitals [04/01/23 0541]  Enc Vitals Group     BP (!) 157/106     Pulse Rate (!) 110     Resp 18     Temp 98.4 F (36.9 C)     Temp Source Oral     SpO2 98 %     Weight 92.1 kg (203 lb)     Height 1.575 m (5\' 2" )     Head Circumference      Peak Flow      Pain Score 10     Pain Loc      Pain Edu?      Excl. in GC?     Most recent vital signs: Vitals:   04/01/23 0541  BP: (!) 157/106  Pulse: (!) 110  Resp: 18  Temp: 98.4 F (36.9 C)  SpO2: 98%    General: Awake, no distress.  Very alert and communicative, no obvious distress, laughing and joking with me. CV:  Good peripheral perfusion.  Mild tachycardia, regular rhythm. Resp:  Normal effort. Speaking easily and comfortably, no accessory muscle usage nor intercostal retractions.   Abd:  No distention.  Very mild tenderness to palpation in the suprapubic region and left lower quadrant.  No rebound or guarding.  Patient has a ventral hernia which is soft and easily reducible and nontender. Other:  Patient does not appear to be in any distress, ambulating without difficulty, not reporting any pain currently.   ED Results /  Procedures / Treatments   Labs (all labs ordered are listed, but only abnormal results are displayed) Labs Reviewed  URINALYSIS, ROUTINE W REFLEX MICROSCOPIC - Abnormal; Notable for the following components:      Result Value   Color, Urine COLORLESS (*)    APPearance CLEAR (*)    All other components within normal limits  CBC WITH DIFFERENTIAL/PLATELET - Abnormal; Notable for the following components:   WBC 12.5 (*)    Neutro Abs 9.3 (*)    All other components within normal limits  LIPASE, BLOOD  COMPREHENSIVE METABOLIC PANEL  LACTIC ACID, PLASMA     PROCEDURES:  Critical Care performed: No  Procedures    IMPRESSION / MDM / ASSESSMENT AND PLAN / ED COURSE  I reviewed the triage vital signs and the nursing notes.                              Differential diagnosis includes, but is not limited to, SBO/ileus or partial SBO, diverticulitis, cystitis/pyelonephritis, renal/ureteral colic, viral illness, IBS.  Patient's presentation is most consistent with acute presentation with potential threat to life or bodily function.  Labs/studies ordered: CMP, lipase, CBC with differential, lactic acid, urinalysis, CT abdomen/pelvis.  Interventions/Medications given:  Medications  lactated ringers bolus 1,000 mL (1,000 mLs Intravenous New Bag/Given 04/01/23 1610)  morphine (PF) 4 MG/ML injection 4 mg (4 mg Intravenous Given 04/01/23 0633)  ondansetron (ZOFRAN) injection 4 mg (4 mg Intravenous Given 04/01/23 0633)  iohexol (OMNIPAQUE) 300 MG/ML solution 100 mL (100 mLs Intravenous Contrast Given 04/01/23 0655)    (Note:  hospital course my include additional interventions and/or labs/studies not listed above.)   Mild tachycardia but the patient is very well-appearing and in no distress.  Lactic acid is pending but the rest of her labs are reassuring other than a mild leukocytosis.  Patient has not yet given a urinalysis but she has minimal urinary symptoms.  My main concern would be for  SBO or partial SBO given her history of extensive abdominal surgery although diverticulitis is also possible.  She agrees with the plan to proceed with CT scan of the abdomen and pelvis.  Medications as listed above to help with her symptoms including a bolus of LR for possible volume depletion and her mild tachycardia.  No indication for emergent antibiotics at this time.     Clinical Course as of 04/01/23 0819  Thu Apr 01, 2023  9604 Lactic Acid, Venous: 1.1 normal [CF]  0801 I viewed and interpreted the patient's CT of the abdomen pelvis and I see no evidence of SBO.  The radiologist confirms no emergent findings.  However he mentioned that there appears to be acute cystitis/UTI, which definitely could correlate clinically with her symptoms.  She has provided a urine specimen which I will evaluate prior to treating her, but anticipate treatment with antibiotics outpatient follow-up.  She agrees with the plan.  Heart rate has normalized. [CF]  [phone] Patient's urinalysis is essentially completely clear and normal.  I talked with her about the possibility of this being interstitial cystitis rather than an acute infectious process (inflammatory versus infectious).  She agrees that she does not want to start antibiotics if she does not need them.  By coincidence, she has an appointment with her general practitioner today.  She will discuss it with her PCP, and I am also giving her follow-up information with Dr. Apolinar Junes with urology if she would like to follow-up with a specialist.  She agrees with the plan.  No indication for admission at this time.  I gave my usual and customary follow-up recommendations and return precautions. [CF]    Clinical Course User Index [CF] Loleta Rose, MD     FINAL CLINICAL IMPRESSION(S) / ED DIAGNOSES   Final diagnoses:  Acute cystitis without hematuria  Lower abdominal pain     Rx / DC Orders   ED Discharge Orders          Ordered    cephALEXin (KEFLEX) 500  MG capsule  3 times daily        04/01/23 0757             Note:  This document was prepared using Dragon voice recognition software and may include unintentional dictation errors.   Loleta Rose, MD 04/01/23 [phone]

## 2023-04-01 NOTE — ED Triage Notes (Signed)
Pt presents ambulatory to triage via POV with complaints of  LLQ pain that started yesterday. Hx of IBS. Rates the pain 10/10 - no meds taken PTA.  A&Ox4 at this time. Denies fevers, chills, N/V/D, urinary sx, CP or SOB.

## 2023-04-01 NOTE — Discharge Instructions (Addendum)
As we discussed, your evaluation was reassuring.  Your CT scan suggest that you may have some infection or inflammation of your bladder, but your urinalysis was completely clear.  We recommend you discuss this with your primary care doctor, and also consider following up with Dr. Apolinar Junes or one of her colleagues with the urology clinic.  But it is unlikely to be beneficial to start antibiotics if your urine has no sign of infection.  Take over-the-counter ibuprofen and/or Tylenol or any other pain medication you take at baseline.  Return to the emergency department if you develop new or worsening symptoms that concern you.

## 2023-04-12 ENCOUNTER — Ambulatory Visit
Admission: RE | Admit: 2023-04-12 | Discharge: 2023-04-12 | Disposition: A | Discharge status: Home / Self Care | Payer: Medicare HMO | Source: Ambulatory Visit | Attending: Acute Care | Admitting: Acute Care

## 2023-04-12 DIAGNOSIS — Z87891 Personal history of nicotine dependence: Secondary | ICD-10-CM | POA: Diagnosis present

## 2023-04-12 DIAGNOSIS — F17210 Nicotine dependence, cigarettes, uncomplicated: Secondary | ICD-10-CM | POA: Diagnosis present

## 2023-04-12 DIAGNOSIS — Z122 Encounter for screening for malignant neoplasm of respiratory organs: Secondary | ICD-10-CM | POA: Insufficient documentation

## 2023-04-15 ENCOUNTER — Other Ambulatory Visit: Payer: Self-pay | Admitting: Acute Care

## 2023-04-15 DIAGNOSIS — Z87891 Personal history of nicotine dependence: Secondary | ICD-10-CM

## 2023-04-15 DIAGNOSIS — Z122 Encounter for screening for malignant neoplasm of respiratory organs: Secondary | ICD-10-CM

## 2023-04-15 DIAGNOSIS — F17210 Nicotine dependence, cigarettes, uncomplicated: Secondary | ICD-10-CM

## 2023-05-24 ENCOUNTER — Ambulatory Visit (INDEPENDENT_AMBULATORY_CARE_PROVIDER_SITE_OTHER): Payer: Medicare HMO | Admitting: Surgery

## 2023-05-24 ENCOUNTER — Encounter: Payer: Self-pay | Admitting: Surgery

## 2023-05-24 VITALS — BP 135/82 | HR 92 | Temp 98.0°F | Ht 62.0 in | Wt 204.0 lb

## 2023-05-24 DIAGNOSIS — K420 Umbilical hernia with obstruction, without gangrene: Secondary | ICD-10-CM

## 2023-05-24 DIAGNOSIS — K436 Other and unspecified ventral hernia with obstruction, without gangrene: Secondary | ICD-10-CM | POA: Diagnosis not present

## 2023-05-24 NOTE — Progress Notes (Signed)
Request for medical clearance has been faxed to Dr Karie Fetch.

## 2023-05-24 NOTE — Patient Instructions (Addendum)
You may try increasing Metamucil to twice a day. Be sure to drink plenty of fluids with this. You may also add Miralax as needed.  You have requested for your Umbilical Hernia be repaired. This will be scheduled with Dr. Aleen Campi at [facility].   Please see your (blue)pre-care sheet for information. Our surgery scheduler will call you to verify surgery date and to go over information.   You will need to arrange to be off work for 1-2 weeks but will have to have a lifting restriction of no more than 15 lbs for 6 weeks following your surgery. If you have FMLA or disability paperwork that needs filled out you may drop this off at our office or this can be faxed to [phone].     Umbilical Hernia, Adult A hernia is a bulge of tissue that pushes through an opening between muscles. An umbilical hernia happens in the abdomen, near the belly button (umbilicus). The hernia may contain tissues from the small intestine, large intestine, or fatty tissue covering the intestines (omentum). Umbilical hernias in adults tend to get worse over time, and they require surgical treatment. There are several types of umbilical hernias. You may have: A hernia located just above or below the umbilicus (indirect hernia). This is the most common type of umbilical hernia in adults. A hernia that forms through an opening formed by the umbilicus (direct hernia). A hernia that comes and goes (reducible hernia). A reducible hernia may be visible only when you strain, lift something heavy, or cough. This type of hernia can be pushed back into the abdomen (reduced). A hernia that traps abdominal tissue inside the hernia (incarcerated hernia). This type of hernia cannot be reduced. A hernia that cuts off blood flow to the tissues inside the hernia (strangulated hernia). The tissues can start to die if this happens. This type of hernia requires emergency treatment.  What are the causes? An umbilical  hernia happens when tissue inside the abdomen presses on a weak area of the abdominal muscles. What increases the risk? You may have a greater risk of this condition if you: Are obese. Have had several pregnancies. Have a buildup of fluid inside your abdomen (ascites). Have had surgery that weakens the abdominal muscles.  What are the signs or symptoms? The main symptom of this condition is a painless bulge at or near the belly button. A reducible hernia may be visible only when you strain, lift something heavy, or cough. Other symptoms may include: Dull pain. A feeling of pressure.  Symptoms of a strangulated hernia may include: Pain that gets increasingly worse. Nausea and vomiting. Pain when pressing on the hernia. Skin over the hernia becoming red or purple. Constipation. Blood in the stool.  How is this diagnosed? This condition may be diagnosed based on: A physical exam. You may be asked to cough or strain while standing. These actions increase the pressure inside your abdomen and force the hernia through the opening in your muscles. Your health care provider may try to reduce the hernia by pressing on it. Your symptoms and medical history.  How is this treated? Surgery is the only treatment for an umbilical hernia. Surgery for a strangulated hernia is done as soon as possible. If you have a small hernia that is not incarcerated, you may need to lose weight before having surgery. Follow these instructions at home: Lose weight, if told by your health care provider. Do not try to push the hernia back  in. Watch your hernia for any changes in color or size. Tell your health care provider if any changes occur. You may need to avoid activities that increase pressure on your hernia. Do not lift anything that is heavier than 10 lb (4.5 kg) until your health care provider says that this is safe. Take over-the-counter and prescription medicines only as told by your health care  provider. Keep all follow-up visits as told by your health care provider. This is important. Contact a health care provider if: Your hernia gets larger. Your hernia becomes painful. Get help right away if: You develop sudden, severe pain near the area of your hernia. You have pain as well as nausea or vomiting. You have pain and the skin over your hernia changes color. You develop a fever. This information is not intended to replace advice given to you by your health care provider. Make sure you discuss any questions you have with your health care provider. Document Released: 04/17/2016 Document Revised: 07/19/2016 Document Reviewed: 04/17/2016 Elsevier Interactive Patient Education  [employer].

## 2023-05-24 NOTE — Progress Notes (Unsigned)
05/24/2023  Reason for Visit:  Umbilical hernia  Requesting Provider:  Karie Fetch, MD  History of Present Illness: Sandra Strong is a 67 y.o. female presenting for evaluation of an umbilical hernia.  The patient reports that she's noticed this for about 2 years.  She denies significant pain but does feel pressure, particularly when things bulge more.  The area bulges more when she's coughing or straining for bowel movement.  She does have chronic constipation history.  She has had prior abdominal surgeries including a transvaginal hysterectomy and an umbilical hernia repair in the past.  She's unsure if mesh was used and reports that it was a very long time ago.  She also smokes.  Denies any nausea or vomiting, chest pain, shortness of breath.  She presented to the ED on 04/01/23 with lower abdominal pain and was diagnosed with cystitis.  On CT scan from the ED visit, she was noted to have an umbilical and a supraumbilical hernia, both containing fat.  I have personally viewed the images.  Past Medical History: Past Medical History:  Diagnosis Date   Anxiety    Arthritis    Asthma    COPD (chronic obstructive pulmonary disease) (HCC)    GERD (gastroesophageal reflux disease)    Hypertension      Past Surgical History: Past Surgical History:  Procedure Laterality Date   ABDOMINAL HYSTERECTOMY     carpel tunnel Bilateral    FOOT SURGERY Left    HERNIA REPAIR     ROTATOR CUFF REPAIR Right     Home Medications: Prior to Admission medications   Medication Sig Start Date End Date Taking? Authorizing Provider  acetaminophen (TYLENOL) 500 MG tablet Take 500 mg by mouth every 6 (six) hours as needed. 2 tabs   Yes [provider]  albuterol (PROVENTIL HFA;VENTOLIN HFA) 108 (90 Base) MCG/ACT inhaler Inhale 2 puffs into the lungs every 4 (four) hours as needed for wheezing or shortness of breath.   Yes [provider]  albuterol (PROVENTIL) (2.5 MG/3ML) 0.083% nebulizer  solution Take 2.5 mg by nebulization every 6 (six) hours as needed for wheezing or shortness of breath.   Yes [provider]  alendronate (FOSAMAX) 70 MG tablet Take 70 mg by mouth once a week. Take with a full glass of water on an empty stomach.   Yes [provider]  Camphor-Menthol-Methyl Sal (SALONPAS) 3.12-05-08 % PTCH Apply topically as needed.   Yes [provider]  cetirizine (ZYRTEC) 10 MG tablet Take 10 mg by mouth daily.   Yes [provider]  Cholecalciferol (VITAMIN D3) 2000 units TABS Take 2,000 Units by mouth daily.   Yes [provider]  Cranberry 500 MG CAPS Take 500 mg by mouth daily.   Yes [provider]  diphenhydrAMINE (BENADRYL) 25 mg capsule Take 50 mg by mouth at bedtime as needed.   Yes [provider]  docusate sodium (COLACE) 50 MG capsule Take 50 mg by mouth 2 (two) times daily.   Yes [provider]  etodolac (LODINE) 400 MG tablet Take 400 mg by mouth 2 (two) times daily.   Yes [provider]  fluticasone (FLONASE) 50 MCG/ACT nasal spray Place 2 sprays into both nostrils daily.   Yes [provider]  fluticasone (FLOVENT HFA) 110 MCG/ACT inhaler Inhale 2 puffs into the lungs 2 (two) times daily.   Yes [provider]  Melatonin 10 MG TABS Take 10 mg by mouth at bedtime.   Yes  [provider]  montelukast (SINGULAIR) 10 MG tablet Take 10 mg by mouth at bedtime.   Yes [provider]  Multiple Vitamins-Minerals (MULTIVITAMIN WITH MINERALS) tablet Take 1 tablet by mouth daily.   Yes [provider]  omeprazole (PRILOSEC) 40 MG capsule Take 40 mg by mouth daily.   Yes [provider]  psyllium (METAMUCIL) 58.6 % powder Take 1 packet by mouth daily.   Yes [provider]  sertraline (ZOLOFT) 25 MG tablet Take 25 mg by mouth daily.   Yes [provider]  sodium chloride (OCEAN) 0.65 % SOLN nasal spray Place 1 spray into both  nostrils as needed for congestion. 07/12/18  Yes Erin Fulling, MD  zolpidem (AMBIEN) 10 MG tablet  08/04/18  Yes [provider]    Allergies: Allergies  Allergen Reactions   Spiriva Handihaler [Tiotropium Bromide Monohydrate] Shortness Of Breath    cough   Advair Diskus [Fluticasone-Salmeterol] Cough    sob   Erythromycin    Sulfa Antibiotics     Social History:  reports that she has been smoking cigarettes. She started smoking about 54 years ago. She has a 66.00 pack-year smoking history. She has been exposed to tobacco smoke. She has never used smokeless tobacco. She reports that she does not drink alcohol and does not use drugs.   Family History: Family History  Problem Relation Age of Onset   Breast cancer Mother 36   Breast cancer Paternal Aunt     Review of Systems: Review of Systems  Constitutional:  Negative for chills and fever.  HENT:  Negative for hearing loss.   Respiratory:  Negative for shortness of breath.   Cardiovascular:  Negative for chest pain.  Gastrointestinal:  Positive for abdominal pain and constipation. Negative for nausea and vomiting.  Genitourinary:  Negative for dysuria.  Musculoskeletal:  Negative for myalgias.  Skin:  Negative for rash.  Neurological:  Negative for dizziness.  Psychiatric/Behavioral:  Negative for depression.     Physical Exam BP 135/82   Pulse 92   Temp 98 F (36.7 C)   Ht 5\' 2"  (1.575 m)   Wt 204 lb (92.5 kg)   SpO2 98%   BMI 37.31 kg/m  CONSTITUTIONAL: No acute distress HEENT:  Normocephalic, atraumatic, extraocular motion intact. NECK: Trachea is midline, and there is no jugular venous distension.  RESPIRATORY:  Lungs are clear, and breath sounds are equal bilaterally. Normal respiratory effort without pathologic use of accessory muscles. CARDIOVASCULAR: Heart is regular without murmurs, gallops, or rubs. GI: The abdomen is soft, non-distended, with some discomfort when pushing on the hernias.  She has  an umbilical and supraumbilical hernia which are both incarcerated, but soft, without evidence of strangulation.  The supraumbilical hernia has more hernia contents, particularly when she coughs/strains. MUSCULOSKELETAL:  Normal muscle strength and tone in all four extremities.  No peripheral edema or cyanosis. SKIN: Skin turgor is normal. There are no pathologic skin lesions.  NEUROLOGIC:  Motor and sensation is grossly normal.  Cranial nerves are grossly intact. PSYCH:  Alert and oriented to person, place and time. Affect is normal.  Laboratory Analysis: Labs on 04/01/23: Na 138, K 4.2, Cl 107, CO2 22, BUN 18, Cr 0.71.  LFTs within normal.  WBC 12.5, Hgb 13.4, Hct 41.2, Plt 318.  Imaging: CT abdomen/pelvis on 04/01/23: IMPRESSION: 1. Mild bladder wall thickening with possible mild perivesical inflammation. Consider UTI/Cystitis. 2. No other acute or inflammatory process identified. No convincing bowel inflammation. 3. Moderate size gastric  hiatal hernia appears stable since 2020. Additional 6 cm ventral abdominal wall hernia of mesenteric fat. 4.  Aortic Atherosclerosis (ICD10-I70.0).  Assessment and Plan: This is a 67 y.o. female with incarcerated umbilical and supraumbilical hernias.  --The patient's hernia defects contain fatty tissue only.  The total length between the defects is about 3-4 cm.  Discussed with the patient that given that they are incarcerated, would recommend surgical repair, but since she is relatively asymptomatic, there is flexibility on timing.  She would prefer to take care of them sooner so there is no risk of them worsening or getting bigger with time.  She had had prior umbilical hernia repair, so that would be a recurrence, and likely the supraumbilical a new hernia.   --Discussed with her then the plan for a robotic assisted ventral hernia repair.  Reviewed with her the procedure at length including the planned incisions, closure of both defects and use of mesh to  reinforce the repair, risks of bleeding, infection, injury to surrounding structures, that this would be an outpatient procedure, post-operative activity restrictions, pain control, and she's willing to proceed. --Patient will be scheduled for 06/24/23.  All of her questions have been answered.  Will send for medical clearance.  I spent 55 minutes dedicated to the care of this patient on the date of this encounter to include pre-visit review of records, face-to-face time with the patient discussing diagnosis and management, and any post-visit coordination of care.   Howie Ill, MD [facility]

## 2023-05-25 ENCOUNTER — Telehealth: Payer: Self-pay | Admitting: Surgery

## 2023-05-25 NOTE — Telephone Encounter (Signed)
Patient has been advised of Pre-Admission date/time, and Surgery date at [facility].  Surgery Date: 06/24/23 Preadmission Testing Date: 06/15/23 (phone 1p-4p)  Patient has been made aware to call [phone], between 1-3:00pm the day before surgery, to find out what time to arrive for surgery.

## 2023-06-14 ENCOUNTER — Telehealth: Payer: Self-pay | Admitting: Surgery

## 2023-06-14 NOTE — Telephone Encounter (Signed)
Error

## 2023-06-15 ENCOUNTER — Other Ambulatory Visit: Payer: Self-pay

## 2023-06-15 ENCOUNTER — Encounter
Admission: RE | Admit: 2023-06-15 | Discharge: 2023-06-15 | Disposition: A | Discharge status: Home / Self Care | Payer: Medicare HMO | Source: Ambulatory Visit | Attending: Surgery | Admitting: Surgery

## 2023-06-15 DIAGNOSIS — R0602 Shortness of breath: Secondary | ICD-10-CM

## 2023-06-15 DIAGNOSIS — Z01812 Encounter for preprocedural laboratory examination: Secondary | ICD-10-CM

## 2023-06-15 HISTORY — DX: Spinal stenosis, lumbar region with neurogenic claudication: M48.062

## 2023-06-15 HISTORY — DX: Unspecified osteoarthritis, unspecified site: M19.90

## 2023-06-15 HISTORY — DX: Anemia, unspecified: D64.9

## 2023-06-15 HISTORY — DX: Other chronic pain: G89.29

## 2023-06-15 HISTORY — DX: Other intervertebral disc degeneration, lumbar region without mention of lumbar back pain or lower extremity pain: M51.369

## 2023-06-15 HISTORY — DX: Spondylosis without myelopathy or radiculopathy, lumbar region: M47.816

## 2023-06-15 HISTORY — DX: Radiculopathy, lumbar region: M54.16

## 2023-06-15 HISTORY — DX: Dyspnea, unspecified: R06.00

## 2023-06-15 HISTORY — DX: Other forms of scoliosis, site unspecified: M41.80

## 2023-06-15 HISTORY — DX: Nicotine dependence, unspecified, uncomplicated: F17.200

## 2023-06-15 HISTORY — DX: Cramp and spasm: R25.2

## 2023-06-15 HISTORY — DX: Other intervertebral disc degeneration, lumbar region: M51.36

## 2023-06-15 HISTORY — DX: Other acute postprocedural pain: G89.18

## 2023-06-15 HISTORY — DX: Other specified postprocedural states: Z98.890

## 2023-06-15 NOTE — Patient Instructions (Addendum)
Your procedure is scheduled on: 06/24/23 - Thursday Report to the Registration Desk on the 1st floor of the [facility]. To find out your arrival time, please call [phone] between 1PM - 3PM on: 06/23/23 - Wednesday If your arrival time is 6:00 am, do not arrive before that time as the [facility] entrance doors do not open until 6:00 am.  REMEMBER: Instructions that are not followed completely may result in serious medical risk, up to and including death; or upon the discretion of your surgeon and anesthesiologist your surgery may need to be rescheduled.  Do not eat food after midnight the night before surgery.  No gum chewing or hard candies.  You may however, drink CLEAR liquids up to 2 hours before you are scheduled to arrive for your surgery. Do not drink anything within 2 hours of your scheduled arrival time.  Clear liquids include: - water  - apple juice without pulp - gatorade (not RED colors) - black coffee or tea (Do NOT add milk or creamers to the coffee or tea) Do NOT drink anything that is not on this list.  One week prior to surgery: SALONPAS  Stop Anti-inflammatories (NSAIDS) such as Advil, Aleve, Ibuprofen, Motrin, Naproxen, Naprosyn and Aspirin based products such as Excedrin, Goody's Powder, BC Powder. You may take Tylenol if needed for pain up until the day of surgery.   Stop taking beginning 06/17/23, ANY OVER THE COUNTER supplements until after surgery : SALON PAS , VITAMIN D3 , Cranberry , MULTIVITAMIN, vitamin E .  Continue taking all prescribed medications with the exception of the following:  etodolac (LODINE) hold beginning 06/17/23.   TAKE ONLY THESE MEDICATIONS THE MORNING OF SURGERY WITH A SIP OF WATER:  omeprazole (PRILOSEC) -(take one the night before and one on the morning of surgery - helps to prevent nausea after surgery.) BREO ELLIPTA  fluticasone (FLONASE)   Use inhaler albuterol (PROVENTIL HFA;VENTOLIN HFA)  on the day of surgery and  bring to the hospital.   No Alcohol for 24 hours before or after surgery.  No Smoking including e-cigarettes for 24 hours before surgery.  No chewable tobacco products for at least 6 hours before surgery.  No nicotine patches on the day of surgery.  Do not use any "recreational" drugs for at least a week (preferably 2 weeks) before your surgery.  Please be advised that the combination of cocaine and anesthesia may have negative outcomes, up to and including death. If you test positive for cocaine, your surgery will be cancelled.  On the morning of surgery brush your teeth with toothpaste and water, you may rinse your mouth with mouthwash if you wish. Do not swallow any toothpaste or mouthwash.  Use CHG Soap or wipes as directed on instruction sheet.  Do not wear jewelry, make-up, hairpins, clips or nail polish.  Do not wear lotions, powders, or perfumes.   Do not shave body hair from the neck down 48 hours before surgery.  Contact lenses, hearing aids and dentures may not be worn into surgery.  Do not bring valuables to the hospital. [facility] is not responsible for any missing/lost belongings or valuables.   Notify your doctor if there is any change in your medical condition (cold, fever, infection).  Wear comfortable clothing (specific to your surgery type) to the hospital.  After surgery, you can help prevent lung complications by doing breathing exercises.  Take deep breaths and cough every 1-2 hours. Your doctor may order a device called an  Incentive Spirometer to help you take deep breaths. When coughing or sneezing, hold a pillow firmly against your incision with both hands. This is called "splinting." Doing this helps protect your incision. It also decreases belly discomfort.  If you are being admitted to the hospital overnight, leave your suitcase in the car. After surgery it may be brought to your room.  In case of increased patient census, it may be necessary for  you, the patient, to continue your postoperative care in the Same Day Surgery department.  If you are being discharged the day of surgery, you will not be allowed to drive home. You will need a responsible individual to drive you home and stay with you for 24 hours after surgery.   If you are taking public transportation, you will need to have a responsible individual with you.  Please call the Pre-admissions Testing Dept. at [phone] if you have any questions about these instructions.  Surgery Visitation Policy:  Patients having surgery or a procedure may have two visitors.  Children under the age of 1 must have an adult with them who is not the patient.  Inpatient Visitation:    Visiting hours are 7 a.m. to 8 p.m. Up to four visitors are allowed at one time in a patient room. The visitors may rotate out with other people during the day.  One visitor age 70 or older may stay with the patient overnight and must be in the room by 8 p.m.    Preparing for Surgery with CHLORHEXIDINE GLUCONATE (CHG) Soap  Chlorhexidine Gluconate (CHG) Soap  o An antiseptic cleaner that kills germs and bonds with the skin to continue killing germs even after washing  o Used for showering the night before surgery and morning of surgery  Before surgery, you can play an important role by reducing the number of germs on your skin.  CHG (Chlorhexidine gluconate) soap is an antiseptic cleanser which kills germs and bonds with the skin to continue killing germs even after washing.  Please do not use if you have an allergy to CHG or antibacterial soaps. If your skin becomes reddened/irritated stop using the CHG.  1. Shower the NIGHT BEFORE SURGERY and the MORNING OF SURGERY with CHG soap.  2. If you choose to wash your hair, wash your hair first as usual with your normal shampoo.  3. After shampooing, rinse your hair and body thoroughly to remove the shampoo.  4. Use CHG as you would any other  liquid soap. You can apply CHG directly to the skin and wash gently with a scrungie or a clean washcloth.  5. Apply the CHG soap to your body only from the neck down. Do not use on open wounds or open sores. Avoid contact with your eyes, ears, mouth, and genitals (private parts). Wash face and genitals (private parts) with your normal soap.  6. Wash thoroughly, paying special attention to the area where your surgery will be performed.  7. Thoroughly rinse your body with warm water.  8. Do not shower/wash with your normal soap after using and rinsing off the CHG soap.  9. Pat yourself dry with a clean towel.  10. Wear clean pajamas to bed the night before surgery.  12. Place clean sheets on your bed the night of your first shower and do not sleep with pets.  13. Shower again with the CHG soap on the day of surgery prior to arriving at the hospital.  14. Do not apply any deodorants/lotions/powders.  15. Please wear clean clothes to the hospital.

## 2023-06-16 ENCOUNTER — Encounter
Admission: RE | Admit: 2023-06-16 | Discharge: 2023-06-16 | Disposition: A | Discharge status: Home / Self Care | Payer: Medicare HMO | Source: Ambulatory Visit | Attending: Surgery | Admitting: Surgery

## 2023-06-16 DIAGNOSIS — I498 Other specified cardiac arrhythmias: Secondary | ICD-10-CM | POA: Insufficient documentation

## 2023-06-16 DIAGNOSIS — R0602 Shortness of breath: Secondary | ICD-10-CM | POA: Insufficient documentation

## 2023-06-16 DIAGNOSIS — Z01812 Encounter for preprocedural laboratory examination: Secondary | ICD-10-CM

## 2023-06-16 DIAGNOSIS — Z01818 Encounter for other preprocedural examination: Secondary | ICD-10-CM | POA: Diagnosis present

## 2023-06-16 DIAGNOSIS — Z01810 Encounter for preprocedural cardiovascular examination: Secondary | ICD-10-CM | POA: Diagnosis not present

## 2023-06-16 LAB — CBC
HCT: 37.8 % (ref 36.0–46.0)
Hemoglobin: 12.9 g/dL (ref 12.0–15.0)
MCH: 29.6 pg (ref 26.0–34.0)
MCHC: 34.1 g/dL (ref 30.0–36.0)
MCV: 86.7 fL (ref 80.0–100.0)
Platelets: 317 10*3/uL (ref 150–400)
RBC: 4.36 MIL/uL (ref 3.87–5.11)
RDW: 14.4 % (ref 11.5–15.5)
WBC: 13.7 10*3/uL — ABNORMAL HIGH (ref 4.0–10.5)
nRBC: 0 % (ref 0.0–0.2)

## 2023-06-17 NOTE — Progress Notes (Unsigned)
Medical Clearance has been received from Dr Letta Pate. The patient is cleared at Medium risk for surgery.

## 2023-06-24 ENCOUNTER — Other Ambulatory Visit: Payer: Self-pay

## 2023-06-24 ENCOUNTER — Ambulatory Visit
Admission: RE | Admit: 2023-06-24 | Discharge: 2023-06-24 | Disposition: A | Discharge status: Home / Self Care | Payer: Medicare HMO | Attending: Surgery | Admitting: Surgery

## 2023-06-24 ENCOUNTER — Encounter
Admission: RE | Disposition: A | Discharge status: Home / Self Care | Payer: Self-pay | Source: Home / Self Care | Attending: Surgery

## 2023-06-24 ENCOUNTER — Encounter: Payer: Self-pay | Admitting: Surgery

## 2023-06-24 ENCOUNTER — Inpatient Hospital Stay: Payer: Medicare HMO | Admitting: Urgent Care

## 2023-06-24 ENCOUNTER — Inpatient Hospital Stay: Payer: Medicare HMO | Admitting: Certified Registered"

## 2023-06-24 DIAGNOSIS — K420 Umbilical hernia with obstruction, without gangrene: Secondary | ICD-10-CM

## 2023-06-24 DIAGNOSIS — K429 Umbilical hernia without obstruction or gangrene: Secondary | ICD-10-CM | POA: Insufficient documentation

## 2023-06-24 DIAGNOSIS — Z79899 Other long term (current) drug therapy: Secondary | ICD-10-CM | POA: Insufficient documentation

## 2023-06-24 DIAGNOSIS — K449 Diaphragmatic hernia without obstruction or gangrene: Secondary | ICD-10-CM | POA: Diagnosis not present

## 2023-06-24 DIAGNOSIS — F17210 Nicotine dependence, cigarettes, uncomplicated: Secondary | ICD-10-CM | POA: Diagnosis not present

## 2023-06-24 DIAGNOSIS — I10 Essential (primary) hypertension: Secondary | ICD-10-CM | POA: Diagnosis not present

## 2023-06-24 DIAGNOSIS — Z90710 Acquired absence of both cervix and uterus: Secondary | ICD-10-CM | POA: Insufficient documentation

## 2023-06-24 DIAGNOSIS — J449 Chronic obstructive pulmonary disease, unspecified: Secondary | ICD-10-CM | POA: Insufficient documentation

## 2023-06-24 DIAGNOSIS — K5909 Other constipation: Secondary | ICD-10-CM | POA: Insufficient documentation

## 2023-06-24 DIAGNOSIS — K436 Other and unspecified ventral hernia with obstruction, without gangrene: Secondary | ICD-10-CM

## 2023-06-24 DIAGNOSIS — Z6837 Body mass index (BMI) 37.0-37.9, adult: Secondary | ICD-10-CM | POA: Insufficient documentation

## 2023-06-24 DIAGNOSIS — K219 Gastro-esophageal reflux disease without esophagitis: Secondary | ICD-10-CM | POA: Insufficient documentation

## 2023-06-24 HISTORY — PX: XI ROBOTIC ASSISTED VENTRAL HERNIA: SHX6789

## 2023-06-24 HISTORY — PX: INSERTION OF MESH: SHX5868

## 2023-06-24 SURGERY — REPAIR, HERNIA, VENTRAL, ROBOT-ASSISTED
Anesthesia: General

## 2023-06-24 MED ORDER — FENTANYL CITRATE (PF) 100 MCG/2ML IJ SOLN
INTRAMUSCULAR | Status: DC | PRN
  Administered 2023-06-24 (×4): 50 ug via INTRAVENOUS

## 2023-06-24 MED ORDER — PHENYLEPHRINE HCL (PRESSORS) 10 MG/ML IV SOLN
INTRAVENOUS | Status: DC | PRN
  Administered 2023-06-24 (×2): 80 ug via INTRAVENOUS
  Administered 2023-06-24: 160 ug via INTRAVENOUS
  Administered 2023-06-24: 80 ug via INTRAVENOUS

## 2023-06-24 MED ORDER — SODIUM CHLORIDE 0.9 % IV SOLN
INTRAVENOUS | Status: DC | PRN
  Administered 2023-06-24: 30 mL

## 2023-06-24 MED ORDER — PROPOFOL 10 MG/ML IV BOLUS
INTRAVENOUS | Status: AC
  Filled 2023-06-24: qty 20

## 2023-06-24 MED ORDER — CHLORHEXIDINE GLUCONATE 0.12 % MT SOLN
15.0000 mL | Freq: Once | OROMUCOSAL | Status: AC
  Administered 2023-06-24: 15 mL via OROMUCOSAL

## 2023-06-24 MED ORDER — ROCURONIUM BROMIDE 100 MG/10ML IV SOLN
INTRAVENOUS | Status: DC | PRN
  Administered 2023-06-24: 50 mg via INTRAVENOUS
  Administered 2023-06-24 (×4): 10 mg via INTRAVENOUS

## 2023-06-24 MED ORDER — SODIUM CHLORIDE FLUSH 0.9 % IV SOLN
INTRAVENOUS | Status: AC
  Filled 2023-06-24: qty 10

## 2023-06-24 MED ORDER — GLYCOPYRROLATE 0.2 MG/ML IJ SOLN
INTRAMUSCULAR | Status: DC | PRN
  Administered 2023-06-24: .2 mg via INTRAVENOUS

## 2023-06-24 MED ORDER — CHLORHEXIDINE GLUCONATE CLOTH 2 % EX PADS
6.0000 | MEDICATED_PAD | Freq: Once | CUTANEOUS | Status: AC
  Administered 2023-06-24: 6 via TOPICAL

## 2023-06-24 MED ORDER — OXYCODONE HCL 5 MG PO TABS
5.0000 mg | ORAL_TABLET | Freq: Once | ORAL | Status: AC
  Administered 2023-06-24: 5 mg via ORAL

## 2023-06-24 MED ORDER — LACTATED RINGERS IV SOLN: INTRAVENOUS | Status: DC | PRN

## 2023-06-24 MED ORDER — IBUPROFEN 600 MG PO TABS: 600.0000 mg | ORAL_TABLET | Freq: Three times a day (TID) | ORAL | 1 refills | Status: DC | PRN

## 2023-06-24 MED ORDER — LIDOCAINE HCL (CARDIAC) PF 100 MG/5ML IV SOSY
PREFILLED_SYRINGE | INTRAVENOUS | Status: DC | PRN
  Administered 2023-06-24: 80 mg via INTRAVENOUS

## 2023-06-24 MED ORDER — OXYCODONE HCL 5 MG PO TABS: 5.0000 mg | ORAL_TABLET | ORAL | 0 refills | Status: DC | PRN

## 2023-06-24 MED ORDER — ACETAMINOPHEN 500 MG PO TABS: 1000.0000 mg | ORAL_TABLET | Freq: Four times a day (QID) | ORAL | Status: DC | PRN

## 2023-06-24 MED ORDER — BUPIVACAINE HCL (PF) 0.5 % IJ SOLN
INTRAMUSCULAR | Status: AC
  Filled 2023-06-24: qty 30

## 2023-06-24 MED ORDER — MIDAZOLAM HCL 2 MG/2ML IJ SOLN
INTRAMUSCULAR | Status: AC
  Filled 2023-06-24: qty 2

## 2023-06-24 MED ORDER — GABAPENTIN 300 MG PO CAPS
ORAL_CAPSULE | ORAL | Status: AC
  Filled 2023-06-24: qty 1

## 2023-06-24 MED ORDER — DEXAMETHASONE SODIUM PHOSPHATE 10 MG/ML IJ SOLN
INTRAMUSCULAR | Status: DC | PRN
  Administered 2023-06-24: 5 mg via INTRAVENOUS

## 2023-06-24 MED ORDER — OXYCODONE HCL 5 MG PO TABS
ORAL_TABLET | ORAL | Status: AC
  Filled 2023-06-24: qty 1

## 2023-06-24 MED ORDER — FENTANYL CITRATE (PF) 100 MCG/2ML IJ SOLN
25.0000 ug | INTRAMUSCULAR | Status: DC | PRN
  Administered 2023-06-24 (×3): 25 ug via INTRAVENOUS

## 2023-06-24 MED ORDER — BUPIVACAINE LIPOSOME 1.3 % IJ SUSP: 20.0000 mL | Freq: Once | INTRAMUSCULAR | Status: DC

## 2023-06-24 MED ORDER — ORAL CARE MOUTH RINSE: 15.0000 mL | Freq: Once | OROMUCOSAL | Status: AC

## 2023-06-24 MED ORDER — FENTANYL CITRATE (PF) 100 MCG/2ML IJ SOLN
INTRAMUSCULAR | Status: AC
  Filled 2023-06-24: qty 2

## 2023-06-24 MED ORDER — CEFAZOLIN SODIUM-DEXTROSE 2-4 GM/100ML-% IV SOLN
2.0000 g | INTRAVENOUS | Status: AC
  Administered 2023-06-24: 2 g via INTRAVENOUS

## 2023-06-24 MED ORDER — SUGAMMADEX SODIUM 200 MG/2ML IV SOLN
INTRAVENOUS | Status: DC | PRN
  Administered 2023-06-24: 200 mg via INTRAVENOUS

## 2023-06-24 MED ORDER — ONDANSETRON HCL 4 MG/2ML IJ SOLN: 4.0000 mg | Freq: Once | INTRAMUSCULAR | Status: DC | PRN

## 2023-06-24 MED ORDER — ACETAMINOPHEN 500 MG PO TABS
1000.0000 mg | ORAL_TABLET | ORAL | Status: AC
  Administered 2023-06-24: 1000 mg via ORAL

## 2023-06-24 MED ORDER — ONDANSETRON HCL 4 MG/2ML IJ SOLN
INTRAMUSCULAR | Status: DC | PRN
Start: 2023-06-24 — End: 2023-06-24
  Administered 2023-06-24: 4 mg via INTRAVENOUS

## 2023-06-24 MED ORDER — KETAMINE HCL 10 MG/ML IJ SOLN
INTRAMUSCULAR | Status: DC | PRN
  Administered 2023-06-24 (×4): 10 mg via INTRAVENOUS

## 2023-06-24 MED ORDER — LACTATED RINGERS IV SOLN: INTRAVENOUS | Status: DC

## 2023-06-24 MED ORDER — CEFAZOLIN SODIUM-DEXTROSE 2-4 GM/100ML-% IV SOLN
INTRAVENOUS | Status: AC
  Filled 2023-06-24: qty 100

## 2023-06-24 MED ORDER — BUPIVACAINE-EPINEPHRINE 0.5% -1:200000 IJ SOLN
INTRAMUSCULAR | Status: DC | PRN
  Administered 2023-06-24: 30 mL

## 2023-06-24 MED ORDER — PROPOFOL 10 MG/ML IV BOLUS: INTRAVENOUS | Status: DC | PRN

## 2023-06-24 MED ORDER — KETOROLAC TROMETHAMINE 15 MG/ML IJ SOLN
INTRAMUSCULAR | Status: DC | PRN
  Administered 2023-06-24: 15 mg via INTRAVENOUS

## 2023-06-24 MED ORDER — KETAMINE HCL 50 MG/5ML IJ SOSY
PREFILLED_SYRINGE | INTRAMUSCULAR | Status: AC
  Filled 2023-06-24: qty 5

## 2023-06-24 MED ORDER — BUPIVACAINE LIPOSOME 1.3 % IJ SUSP
INTRAMUSCULAR | Status: AC
  Filled 2023-06-24: qty 20

## 2023-06-24 MED ORDER — CHLORHEXIDINE GLUCONATE 0.12 % MT SOLN
OROMUCOSAL | Status: AC
  Filled 2023-06-24: qty 15

## 2023-06-24 MED ORDER — PROPOFOL 10 MG/ML IV BOLUS
INTRAVENOUS | Status: DC | PRN
  Administered 2023-06-24: 130 mg via INTRAVENOUS

## 2023-06-24 MED ORDER — GABAPENTIN 300 MG PO CAPS
300.0000 mg | ORAL_CAPSULE | ORAL | Status: AC
  Administered 2023-06-24: 300 mg via ORAL

## 2023-06-24 MED ORDER — 0.9 % SODIUM CHLORIDE (POUR BTL) OPTIME
TOPICAL | Status: DC | PRN
  Administered 2023-06-24: 500 mL

## 2023-06-24 MED ORDER — ACETAMINOPHEN 500 MG PO TABS
ORAL_TABLET | ORAL | Status: AC
  Filled 2023-06-24: qty 2

## 2023-06-24 MED ORDER — EPINEPHRINE PF 1 MG/ML IJ SOLN
INTRAMUSCULAR | Status: AC
  Filled 2023-06-24: qty 1

## 2023-06-24 MED ORDER — MIDAZOLAM HCL 2 MG/2ML IJ SOLN
INTRAMUSCULAR | Status: DC | PRN
  Administered 2023-06-24: 1 mg via INTRAVENOUS

## 2023-06-24 MED ORDER — DEXMEDETOMIDINE HCL IN NACL 80 MCG/20ML IV SOLN
INTRAVENOUS | Status: DC | PRN
  Administered 2023-06-24 (×3): 4 ug via INTRAVENOUS
  Administered 2023-06-24: 8 ug via INTRAVENOUS

## 2023-06-24 SURGICAL SUPPLY — 58 items
ADH SKN CLS APL DERMABOND .7 (GAUZE/BANDAGES/DRESSINGS) ×2
BLADE SURG SZ11 CARB STEEL (BLADE) ×2 IMPLANT
CANNULA CAP OBTURATR AIRSEAL 8 (CAP) IMPLANT
COVER TIP SHEARS 8 DVNC (MISCELLANEOUS) ×2 IMPLANT
COVER WAND RF STERILE (DRAPES) ×2 IMPLANT
DERMABOND ADVANCED .7 DNX12 (GAUZE/BANDAGES/DRESSINGS) ×2 IMPLANT
DRAPE ARM DVNC X/XI (DISPOSABLE) ×6 IMPLANT
DRAPE COLUMN DVNC XI (DISPOSABLE) ×2 IMPLANT
ELECT CAUTERY BLADE TIP 2.5 (TIP) ×2
ELECT REM PT RETURN 9FT ADLT (ELECTROSURGICAL) ×2
ELECTRODE CAUTERY BLDE TIP 2.5 (TIP) ×2 IMPLANT
ELECTRODE REM PT RTRN 9FT ADLT (ELECTROSURGICAL) ×2 IMPLANT
FORCEPS BPLR R/ABLATION 8 DVNC (INSTRUMENTS) ×2 IMPLANT
GLOVE SURG SYN 7.0 (GLOVE) ×12 IMPLANT
GLOVE SURG SYN 7.0 PF PI (GLOVE) ×4 IMPLANT
GLOVE SURG SYN 7.5 E (GLOVE) ×12 IMPLANT
GLOVE SURG SYN 7.5 PF PI (GLOVE) ×4 IMPLANT
GOWN STRL REUS W/ TWL LRG LVL3 (GOWN DISPOSABLE) ×6 IMPLANT
GOWN STRL REUS W/TWL LRG LVL3 (GOWN DISPOSABLE) ×12
GRASPER SUT TROCAR 14GX15 (MISCELLANEOUS) ×2 IMPLANT
IRRIGATION STRYKERFLOW (MISCELLANEOUS) IMPLANT
IRRIGATOR STRYKERFLOW (MISCELLANEOUS)
IV NS 1000ML (IV SOLUTION)
IV NS 1000ML BAXH (IV SOLUTION) IMPLANT
KIT PINK PAD W/HEAD ARE REST (MISCELLANEOUS) ×2
KIT PINK PAD W/HEAD ARM REST (MISCELLANEOUS) ×2 IMPLANT
LABEL OR SOLS (LABEL) ×2 IMPLANT
MANIFOLD NEPTUNE II (INSTRUMENTS) ×2 IMPLANT
MESH VENTRALIGHT ST 4.5 ECHO (Mesh General) IMPLANT
NDL DRIVE SUT CUT DVNC (INSTRUMENTS) ×2 IMPLANT
NDL HYPO 22X1.5 SAFETY MO (MISCELLANEOUS) ×2 IMPLANT
NDL INSUFFLATION 14GA 120MM (NEEDLE) ×2 IMPLANT
NEEDLE DRIVE SUT CUT DVNC (INSTRUMENTS) ×2 IMPLANT
NEEDLE HYPO 22X1.5 SAFETY MO (MISCELLANEOUS) ×2 IMPLANT
NEEDLE INSUFFLATION 14GA 120MM (NEEDLE) ×2 IMPLANT
OBTURATOR OPTICAL STND 8 DVNC (TROCAR) ×2
OBTURATOR OPTICALSTD 8 DVNC (TROCAR) ×2 IMPLANT
PACK LAP CHOLECYSTECTOMY (MISCELLANEOUS) ×2 IMPLANT
PENCIL SMOKE EVACUATOR (MISCELLANEOUS) ×2 IMPLANT
SCISSORS MNPLR CVD DVNC XI (INSTRUMENTS) ×2 IMPLANT
SEAL UNIV 5-12 XI (MISCELLANEOUS) ×4 IMPLANT
SET TUBE FILTERED XL AIRSEAL (SET/KITS/TRAYS/PACK) IMPLANT
SET TUBE SMOKE EVAC HIGH FLOW (TUBING) ×2 IMPLANT
SOL ELECTROSURG ANTI STICK (MISCELLANEOUS) ×2
SOLUTION ELECTROSURG ANTI STCK (MISCELLANEOUS) ×2 IMPLANT
SPONGE T-LAP 18X18 ~~LOC~~+RFID (SPONGE) ×2 IMPLANT
SUT MNCRL 4-0 (SUTURE) ×2
SUT MNCRL 4-0 27XMFL (SUTURE) ×2
SUT STRATAFIX PDS 30 CT-1 (SUTURE) ×2 IMPLANT
SUT VICRYL 0 UR6 27IN ABS (SUTURE) ×4 IMPLANT
SUT VLOC 90 2/L VL 12 GS22 (SUTURE) ×4 IMPLANT
SUTURE MNCRL 4-0 27XMF (SUTURE) ×2 IMPLANT
SYS BAG RETRIEVAL 10MM (BASKET) ×2
SYSTEM BAG RETRIEVAL 10MM (BASKET) IMPLANT
TAPE TRANSPORE STRL 2 31045 (GAUZE/BANDAGES/DRESSINGS) ×2 IMPLANT
TRAP FLUID SMOKE EVACUATOR (MISCELLANEOUS) ×2 IMPLANT
TRAY FOLEY SLVR 16FR LF STAT (SET/KITS/TRAYS/PACK) ×2 IMPLANT
WATER STERILE IRR 500ML POUR (IV SOLUTION) ×2 IMPLANT

## 2023-06-24 NOTE — Anesthesia Procedure Notes (Signed)
Procedure Name: Intubation Date/Time: 06/24/2023 9:59 AM  Performed by: Merlene Pulling, CRNAPre-anesthesia Checklist: Patient identified, Patient being monitored, Timeout performed, Emergency Drugs available and Suction available Patient Re-evaluated:Patient Re-evaluated prior to induction Oxygen Delivery Method: Circle system utilized Preoxygenation: Pre-oxygenation with 100% oxygen Induction Type: IV induction Ventilation: Mask ventilation without difficulty Laryngoscope Size: 3 and McGraph Grade View: Grade I Tube type: Oral Tube size: 7.0 mm Number of attempts: 1 Airway Equipment and Method: Stylet Placement Confirmation: ETT inserted through vocal cords under direct vision, positive ETCO2 and breath sounds checked- equal and bilateral Secured at: 21 cm Tube secured with: Tape Dental Injury: Teeth and Oropharynx as per pre-operative assessment

## 2023-06-24 NOTE — H&P (Signed)
06/24/23  Reason for Visit:  Umbilical hernia   Requesting Provider:  Karie Fetch, MD   History of Present Illness: Sandra Strong is a 67 y.o. female presenting for evaluation of an umbilical hernia.  The patient reports that she's noticed this for about 2 years.  She denies significant pain but does feel pressure, particularly when things bulge more.  The area bulges more when she's coughing or straining for bowel movement.  She does have chronic constipation history.  She has had prior abdominal surgeries including a transvaginal hysterectomy and an umbilical hernia repair in the past.  She's unsure if mesh was used and reports that it was a very long time ago.  She also smokes.  Denies any nausea or vomiting, chest pain, shortness of breath.  She presented to the ED on 04/01/23 with lower abdominal pain and was diagnosed with cystitis.  On CT scan from the ED visit, she was noted to have an umbilical and a supraumbilical hernia, both containing fat.  I have personally viewed the images.   Past Medical History:     Past Medical History:  Diagnosis Date   Anxiety     Arthritis     Asthma     COPD (chronic obstructive pulmonary disease) (HCC)     GERD (gastroesophageal reflux disease)     Hypertension            Past Surgical History:      Past Surgical History:  Procedure Laterality Date   ABDOMINAL HYSTERECTOMY       carpel tunnel Bilateral     FOOT SURGERY Left     HERNIA REPAIR       ROTATOR CUFF REPAIR Right            Home Medications:        Prior to Admission medications   Medication Sig Start Date End Date Taking? Authorizing Provider  acetaminophen (TYLENOL) 500 MG tablet Take 500 mg by mouth every 6 (six) hours as needed. 2 tabs     Yes [provider]  albuterol (PROVENTIL HFA;VENTOLIN HFA) 108 (90 Base) MCG/ACT inhaler Inhale 2 puffs into the lungs every 4 (four) hours as needed for wheezing or shortness of breath.     Yes [provider]   albuterol (PROVENTIL) (2.5 MG/3ML) 0.083% nebulizer solution Take 2.5 mg by nebulization every 6 (six) hours as needed for wheezing or shortness of breath.     Yes [provider]  alendronate (FOSAMAX) 70 MG tablet Take 70 mg by mouth once a week. Take with a full glass of water on an empty stomach.     Yes [provider]  Camphor-Menthol-Methyl Sal (SALONPAS) 3.12-05-08 % PTCH Apply topically as needed.     Yes [provider]  cetirizine (ZYRTEC) 10 MG tablet Take 10 mg by mouth daily.     Yes [provider]  Cholecalciferol (VITAMIN D3) 2000 units TABS Take 2,000 Units by mouth daily.     Yes [provider]  Cranberry 500 MG CAPS Take 500 mg by mouth daily.     Yes [provider]  diphenhydrAMINE (BENADRYL) 25 mg capsule Take 50 mg by mouth at bedtime as needed.     Yes [provider]  docusate sodium (COLACE) 50 MG capsule Take 50 mg by mouth 2 (two) times daily.     Yes [provider]  etodolac (LODINE) 400 MG tablet Take 400 mg by mouth 2 (two) times daily.  Yes [provider]  fluticasone (FLONASE) 50 MCG/ACT nasal spray Place 2 sprays into both nostrils daily.     Yes [provider]  fluticasone (FLOVENT HFA) 110 MCG/ACT inhaler Inhale 2 puffs into the lungs 2 (two) times daily.     Yes [provider]  Melatonin 10 MG TABS Take 10 mg by mouth at bedtime.     Yes [provider]  montelukast (SINGULAIR) 10 MG tablet Take 10 mg by mouth at bedtime.     Yes [provider]  Multiple Vitamins-Minerals (MULTIVITAMIN WITH MINERALS) tablet Take 1 tablet by mouth daily.     Yes [provider]  omeprazole (PRILOSEC) 40 MG capsule Take 40 mg by mouth daily.     Yes [provider]  psyllium (METAMUCIL) 58.6 % powder Take 1 packet by mouth daily.     Yes [provider]  sertraline (ZOLOFT) 25 MG tablet Take 25 mg by mouth daily.     Yes [provider]  sodium chloride (OCEAN) 0.65 % SOLN nasal spray Place 1 spray into both nostrils as needed for congestion. 07/12/18   Yes Erin Fulling, MD  zolpidem (AMBIEN) 10 MG tablet   08/04/18   Yes [provider]      Allergies: Allergies       Allergies  Allergen Reactions   Spiriva Handihaler [Tiotropium Bromide Monohydrate] Shortness Of Breath      cough   Advair Diskus [Fluticasone-Salmeterol] Cough      sob   Erythromycin     Sulfa Antibiotics          Social History:  reports that she has been smoking cigarettes. She started smoking about 54 years ago. She has a 66.00 pack-year smoking history. She has been exposed to tobacco smoke. She has never used smokeless tobacco. She reports that she does not drink alcohol and does not use drugs.    Family History:      Family History  Problem Relation Age of Onset   Breast cancer Mother 64   Breast cancer Paternal Aunt            Review of Systems: Review of Systems  Constitutional:  Negative for chills and fever.  HENT:  Negative for hearing loss.   Respiratory:  Negative for shortness of breath.   Cardiovascular:  Negative for chest pain.  Gastrointestinal:  Positive for abdominal pain and constipation. Negative for nausea and vomiting.  Genitourinary:  Negative for dysuria.  Musculoskeletal:  Negative for myalgias.  Skin:  Negative for rash.  Neurological:  Negative for dizziness.  Psychiatric/Behavioral:  Negative for depression.       Physical Exam BP 135/82   Pulse 92   Temp 98 F (36.7 C)   Ht 5\' 2"  (1.575 m)   Wt 204 lb (92.5 kg)   SpO2 98%   BMI 37.31 kg/m  CONSTITUTIONAL: No acute distress HEENT:  Normocephalic, atraumatic, extraocular motion intact. NECK: Trachea is midline, and there is no jugular venous distension.  RESPIRATORY:  Lungs are clear, and breath sounds are equal bilaterally. Normal respiratory effort without pathologic use of accessory muscles. CARDIOVASCULAR: Heart is  regular without murmurs, gallops, or rubs. GI: The abdomen is soft, non-distended, with some discomfort when pushing on the hernias.  She has an umbilical and supraumbilical hernia which are both incarcerated, but soft, without evidence of strangulation.  The supraumbilical hernia has more hernia contents, particularly when she coughs/strains. MUSCULOSKELETAL:  Normal muscle strength and tone  in all four extremities.  No peripheral edema or cyanosis. SKIN: Skin turgor is normal. There are no pathologic skin lesions.  NEUROLOGIC:  Motor and sensation is grossly normal.  Cranial nerves are grossly intact. PSYCH:  Alert and oriented to person, place and time. Affect is normal.   Laboratory Analysis: Labs on 04/01/23: Na 138, K 4.2, Cl 107, CO2 22, BUN 18, Cr 0.71.  LFTs within normal.  WBC 12.5, Hgb 13.4, Hct 41.2, Plt 318.   Imaging: CT abdomen/pelvis on 04/01/23: IMPRESSION: 1. Mild bladder wall thickening with possible mild perivesical inflammation. Consider UTI/Cystitis. 2. No other acute or inflammatory process identified. No convincing bowel inflammation. 3. Moderate size gastric hiatal hernia appears stable since 2020. Additional 6 cm ventral abdominal wall hernia of mesenteric fat. 4.  Aortic Atherosclerosis (ICD10-I70.0).   Assessment and Plan: This is a 67 y.o. female with incarcerated umbilical and supraumbilical hernias.   --The patient's hernia defects contain fatty tissue only.  The total length between the defects is about 3-4 cm.  Discussed with the patient that given that they are incarcerated, would recommend surgical repair, but since she is relatively asymptomatic, there is flexibility on timing.  She would prefer to take care of them sooner so there is no risk of them worsening or getting bigger with time.  She had had prior umbilical hernia repair, so that would be a recurrence, and likely the supraumbilical a new hernia.   --Discussed with her then the plan for a robotic  assisted ventral hernia repair.  Reviewed with her the procedure at length including the planned incisions, closure of both defects and use of mesh to reinforce the repair, risks of bleeding, infection, injury to surrounding structures, that this would be an outpatient procedure, post-operative activity restrictions, pain control, and she's willing to proceed. --Patient is scheduled for 06/24/23.  All of her questions have been answered.  She had been cleared by her PCP for surgery.    Howie Ill, MD [facility]

## 2023-06-24 NOTE — Transfer of Care (Signed)
Immediate Anesthesia Transfer of Care Note  Patient: Sandra Strong  Procedure(s) Performed: XI ROBOTIC ASSISTED VENTRAL HERNIA INSERTION OF MESH  Patient Location: PACU  Anesthesia Type:General  Level of Consciousness: drowsy  Airway & Oxygen Therapy: Patient Spontanous Breathing and Patient connected to face mask oxygen  Post-op Assessment: Report given to RN and Post -op Vital signs reviewed and stable  Post vital signs: Reviewed  Last Vitals:  Vitals Value Taken Time  BP 155/96 06/24/23 1205  Temp 53F   Pulse 90 06/24/23 1210  Resp 19 06/24/23 1210  SpO2 92 % 06/24/23 1210  Vitals shown include unfiled device data.  Last Pain:  Vitals:   06/24/23 0844  TempSrc: Temporal  PainSc: 5       Patients Stated Pain Goal: 0 (06/24/23 0844)  Complications: No notable events documented.

## 2023-06-24 NOTE — Discharge Instructions (Addendum)
Discharge Instructions 1.  Patient may shower, but do not scrub wounds heavily and dab dry only. 2.  Do not submerge wounds in pool/tub until fully healed. 3.  Do not apply ointments or hydrogen peroxide to the wounds. 4.  May apply ice packs to the wounds for comfort. 5.  Please wear your abdominal binder at all times for the next 4 weeks.  May remove for showers. 6.  No heavy lifting or pushing of more than 10-15 lbs for 4 weeks. 7.  Do not drive while taking narcotics for pain control.  Prior to driving, make sure you are able to rotate right and left to look at blindspots without significant pain or discomfort.   AMBULATORY SURGERY  DISCHARGE INSTRUCTIONS   The drugs that you were given will stay in your system until tomorrow so for the next 24 hours you should not:  Drive an automobile Make any legal decisions Drink any alcoholic beverage   You may resume regular meals tomorrow.  Today it is better to start with liquids and gradually work up to solid foods.  You may eat anything you prefer, but it is better to start with liquids, then soup and crackers, and gradually work up to solid foods.   Please notify your doctor immediately if you have any unusual bleeding, trouble breathing, redness and pain at the surgery site, drainage, fever, or pain not relieved by medication.   Information for Discharge Teaching:  DO NOT REMOVE TEAL EXPAREL BRACELET FOR 4 Days (96 hours) 06/28/2023 EXPAREL (bupivacaine liposome injectable suspension)   Your surgeon or anesthesiologist gave you EXPAREL(bupivacaine) to help control your pain after surgery.  EXPAREL is a local anesthetic that provides pain relief by numbing the tissue around the surgical site. EXPAREL is designed to release pain medication over time and can control pain for up to 72 hours. Depending on how you respond to EXPAREL, you may require less pain medication during your recovery.  Possible side effects: Temporary loss of  sensation or ability to move in the area where bupivacaine was injected. Nausea, vomiting, constipation Rarely, numbness and tingling in your mouth or lips, lightheadedness, or anxiety may occur. Call your doctor right away if you think you may be experiencing any of these sensations, or if you have other questions regarding possible side effects.  Follow all other discharge instructions given to you by your surgeon or nurse. Eat a healthy diet and drink plenty of water or other fluids.  If you return to the hospital for any reason within 96 hours following the administration of EXPAREL, it is important for health care providers to know that you have received this anesthetic. A teal colored band has been placed on your arm with the date, time and amount of EXPAREL you have received in order to alert and inform your health care providers. Please leave this armband in place for the full 96 hours following administration, and then you may remove the band.        Please contact your physician with any problems or Same Day Surgery at [phone], Monday through Friday 6 am to 4 pm, or [place] at [facility] number at [phone].

## 2023-06-24 NOTE — Anesthesia Postprocedure Evaluation (Signed)
Anesthesia Post Note  Patient: Sandra Strong  Procedure(s) Performed: XI ROBOTIC ASSISTED VENTRAL HERNIA INSERTION OF MESH  Patient location during evaluation: PACU Anesthesia Type: General Level of consciousness: awake and awake and alert Pain management: satisfactory to patient Vital Signs Assessment: post-procedure vital signs reviewed and stable Respiratory status: spontaneous breathing Cardiovascular status: stable Anesthetic complications: no   No notable events documented.   Last Vitals:  Vitals:   06/24/23 1245 06/24/23 1250  BP: (!) 154/108   Pulse: 81 73  Resp: 16 13  Temp: 36.7 C   SpO2: 92% (!) 87%    Last Pain:  Vitals:   06/24/23 1255  TempSrc:   PainSc: 6                  VAN STAVEREN,Breelyn Icard

## 2023-06-24 NOTE — Op Note (Signed)
Procedure Date:  06/24/2023  Pre-operative Diagnosis:  Recurrent umbilical hernia, new supraumbilical hernia, both incarcerated.  Post-operative Diagnosis: Recurrent umbilical hernia, new supraumbilical hernia, both incarcerated, total defect length 3.5 cm.  Procedure:  Robotic assisted Incarcerated umbilical and supraumbilical Hernia Repair with mesh  Surgeon:  Howie Ill, MD  Assistant:  Rosezena Sensor, PA-S  Anesthesia:  General endotracheal  Estimated Blood Loss:  15 ml  Specimens:  None  Complications:  None  Indications for Procedure:  This is a 67 y.o. female who presents with a recurrent umbilical hernia and a new supraumbilical hernia, both incarcerated.  The options of surgery versus observation were reviewed with the patient and/or family. The risks of bleeding, abscess or infection, recurrence of symptoms, potential for an open procedure, injury to surrounding structures, and chronic pain were all discussed with the patient and was willing to proceed.  Description of Procedure: The patient was correctly identified in the preoperative area and brought into the operating room.  The patient was placed supine with VTE prophylaxis in place.  Appropriate time-outs were performed.  Anesthesia was induced and the patient was intubated.  Appropriate antibiotics were infused.  The abdomen was prepped and draped in a sterile fashion. The patient's hernia defect was marked with a marking pen.  A Veress needle was introduced in the left upper quadrant and pneumoperitoneum was obtained with appropriate pressures.  Using Optiview technique, an 8 mm port was introduced in the left lateral abdominal wall without complications.  Then, a 12 mm port was introduced in the left upper quadrant and an 8 mm port in the left lower quadrant under direct visualization.  The DaVinci platform was docked, camera targeted, and instruments placed under direct visualization.  The patient's  supraumbilical hernia contained omentum which was incarcerated.  The umbilical hernia contained preperitoneal fat.  Both hernias were fully reduced and the peritoneum and preperitoneal fat were dissected and resected to allow better exposure of the hernia defect and for better mesh placement.  Both hernias combined measured 3.5 cm.  A 4.5 inch Bard Ventralight ST Echo mesh, a 0 Stratafix suture, and two 2-0 V-loc sutures were inserted through the 12 mm port under direct visualization. The hernia defects were closed together using the stratafix suture.  A PMI was brought through the center of the hernia defects and the positioning system of the mesh was passed through.  This allowed the mesh to splay open and be centered over the repair site with good overlap.  The mesh was then sutured in place circumferentially and through the center of the mesh using the V-loc sutures.  The preperitoneal fat was placed in an Endocatch bag.  All needles and the positioning system were then removed through the 12 mm port without complications.  The DaVinci platform was then undocked and instruments removed.    60 ml of Exparel solution mixed with 0.5% bupivacaine with epi was infiltrated around the mesh edges, hernia repair site, and port sites.  The 12 mm port was removed and the bag retrieved.  The fascia was closed under direct visualization utilizing an Endo Close technique with 0 Vicryl suture.  The 8 mm ports were removed. The 12 mm incision was closed using 3-0 Vicryl and 4-0 Monocryl, and the other port incisions were closed with 4-0 Monocryl.  The wounds were cleaned and sealed with DermaBond.  The patient was emerged from anesthesia and extubated and brought to the recovery room for further management.  The patient tolerated the  procedure well and all counts were correct at the end of the case.   Howie Ill, MD

## 2023-06-24 NOTE — Anesthesia Preprocedure Evaluation (Signed)
Anesthesia Evaluation  Patient identified by MRN, date of birth, ID band Patient awake    Reviewed: Allergy & Precautions, NPO status , Patient's Chart, lab work & pertinent test results  Airway Mallampati: III  TM Distance: >3 FB Neck ROM: full    Dental  (+) Missing, Poor Dentition   Pulmonary neg pulmonary ROS, shortness of breath, COPD,  COPD inhaler, Current Smoker   Pulmonary exam normal breath sounds clear to auscultation       Cardiovascular Exercise Tolerance: Good hypertension, Pt. on medications negative cardio ROS Normal cardiovascular exam Rhythm:Regular Rate:Normal     Neuro/Psych   Anxiety     negative neurological ROS  negative psych ROS   GI/Hepatic negative GI ROS, Neg liver ROS,GERD  Medicated,,  Endo/Other  negative endocrine ROS  Morbid obesity  Renal/GU negative Renal ROS     Musculoskeletal   Abdominal  (+) + obese  Peds negative pediatric ROS (+)  Hematology negative hematology ROS (+) Blood dyscrasia, anemia   Anesthesia Other Findings Past Medical History: No date: Acute postoperative pain No date: Anemia No date: Anxiety No date: Arthritis No date: Asthma No date: Chronic BILATERAL sacroiliac joint pain No date: Chronic knee pain No date: Chronic pain No date: COPD (chronic obstructive pulmonary disease) (HCC) No date: Current every day smoker No date: DDD (degenerative disc disease), lumbar No date: Dyspnea No date: GERD (gastroesophageal reflux disease) No date: History of carpal tunnel release No date: Hypertension No date: Levoscoliosis No date: Lumbar facet arthropathy No date: Lumbar radiculitis No date: Lumbar stenosis with neurogenic claudication No date: Muscle cramps No date: Osteoarthritis No date: Osteoarthritis of facet joint of lumbar spine  Past Surgical History: No date: ABDOMINAL HYSTERECTOMY No date: carpel tunnel; Bilateral No date: COLONOSCOPY No  date: DENTAL SURGERY     Comment:  x 2 No date: ESOPHAGOGASTRODUODENOSCOPY No date: FOOT SURGERY; Left No date: HERNIA REPAIR No date: ROTATOR CUFF REPAIR; Right  BMI    Body Mass Index: 37.31 kg/m      Reproductive/Obstetrics negative OB ROS                             Anesthesia Physical Anesthesia Plan  ASA: 3  Anesthesia Plan: General   Post-op Pain Management:    Induction: Intravenous  PONV Risk Score and Plan: 1 and Ondansetron and Dexamethasone  Airway Management Planned: Oral ETT  Additional Equipment:   Intra-op Plan:   Post-operative Plan: Extubation in OR  Informed Consent: I have reviewed the patients History and Physical, chart, labs and discussed the procedure including the risks, benefits and alternatives for the proposed anesthesia with the patient or authorized representative who has indicated his/her understanding and acceptance.     Dental Advisory Given  Plan Discussed with: CRNA and Surgeon  Anesthesia Plan Comments:        Anesthesia Quick Evaluation

## 2023-06-25 ENCOUNTER — Encounter: Payer: Self-pay | Admitting: Surgery

## 2023-06-29 ENCOUNTER — Telehealth: Payer: Self-pay

## 2023-06-29 NOTE — Telephone Encounter (Signed)
Patient confirmed no cardiac history.  Directions to the office provided.

## 2023-07-01 ENCOUNTER — Ambulatory Visit (INDEPENDENT_AMBULATORY_CARE_PROVIDER_SITE_OTHER): Payer: Medicare HMO | Admitting: Physician Assistant

## 2023-07-01 ENCOUNTER — Encounter: Payer: Self-pay | Admitting: Physician Assistant

## 2023-07-01 VITALS — BP 149/91 | HR 105 | Temp 98.1°F | Ht 60.0 in | Wt 204.6 lb

## 2023-07-01 DIAGNOSIS — K420 Umbilical hernia with obstruction, without gangrene: Secondary | ICD-10-CM | POA: Diagnosis not present

## 2023-07-01 DIAGNOSIS — Z09 Encounter for follow-up examination after completed treatment for conditions other than malignant neoplasm: Secondary | ICD-10-CM | POA: Diagnosis not present

## 2023-07-01 DIAGNOSIS — K436 Other and unspecified ventral hernia with obstruction, without gangrene: Secondary | ICD-10-CM | POA: Diagnosis not present

## 2023-07-01 NOTE — Progress Notes (Signed)
[facility] POST-OP OFFICE VISIT  07/01/2023  HPI: Sandra Strong is a 67 y.o. female 7 days s/p Robotic assisted Incarcerated umbilical and supraumbilical Hernia Repair with mesh  with Dr Aleen Campi   She is overall doing well She wanted to be seen earlier due to some increased pain and mild swelling at most superior incision She denied any trauma or injury to this aside from intermittent coughing No fever, chills, nausea, emesis or bowel changes She is quite pleased with how things are going Incisions themselves otherwise are healing well aside from mild bruising  Ambulating at baseline Using abdominal binder although feels this does not fit her well   Vital signs: BP (!) 149/91   Pulse (!) 105   Temp 98.1 F (36.7 C)   Ht 5' (1.524 m)   Wt 204 lb 9.6 oz (92.8 kg)   SpO2 98%   BMI 39.96 kg/m    Physical Exam: Constitutional: Well appearing female, NAD Abdomen: Soft, expected degrees of incisions and ventral soreness, non-distended, no rebound/guarding Skin: Her most superior incision is slightly indurated consistent with scarring and inflammation following surgery, there is no fluctuance, no erythema. There is no evidence of hematoma, seroma, nor abscess. There is some healing ecchymosis present. All other laparoscopic incisions are healing well, no erythema or drainage   Assessment/Plan: This is a 67 y.o. female 7 days s/p Robotic assisted Incarcerated umbilical and supraumbilical Hernia Repair with mesh  with Dr Aleen Campi    - Do not suspect any complication at this point, certainly no evidence of recurrent or new hernia at incision sites.   - Pain control prn; OTC modalities should be sufficient at this point   - Continue abdominal binder as tolerated; especially with activity   - Reviewed wound care recommendation  - Reviewed lifting restrictions; 6 weeks total  - I will push back her initially scheduled follow up from 08/07 to 08/16 with Dr Aleen Campi; She  understands to call with questions/concerns in the interim   -- Lynden Oxford, PA-C [facility] 07/01/2023, 3:57 PM M-F: 7am - 4pm

## 2023-07-07 ENCOUNTER — Encounter: Payer: Medicare HMO | Admitting: Surgery

## 2023-07-09 ENCOUNTER — Encounter: Payer: Self-pay | Admitting: Cardiology

## 2023-07-09 ENCOUNTER — Ambulatory Visit: Payer: Medicare HMO | Attending: Cardiology | Admitting: Cardiology

## 2023-07-09 VITALS — BP 118/76 | HR 76 | Ht 60.0 in | Wt 208.8 lb

## 2023-07-09 DIAGNOSIS — F17200 Nicotine dependence, unspecified, uncomplicated: Secondary | ICD-10-CM

## 2023-07-09 DIAGNOSIS — I2510 Atherosclerotic heart disease of native coronary artery without angina pectoris: Secondary | ICD-10-CM

## 2023-07-09 DIAGNOSIS — E7800 Pure hypercholesterolemia, unspecified: Secondary | ICD-10-CM

## 2023-07-09 MED ORDER — CLOPIDOGREL BISULFATE 75 MG PO TABS: 75.0000 mg | ORAL_TABLET | Freq: Every day | ORAL | 3 refills | Status: DC

## 2023-07-09 NOTE — Progress Notes (Addendum)
Cardiology Office Note:    Date:  07/09/2023   ID:  REDIA TURCO, DOB 1956-09-23, MRN [id number]  PCP:  Emogene Morgan, MD   [facility] Providers Cardiologist:  Debbe Odea, MD     Referring MD: Emogene Morgan, MD   Chief Complaint  Patient presents with   New Patient (Initial Visit)    Recent lung cancer screening CT resulted with Aortic Atherosclerosis.  No cardiac symptoms with no history.    History of Present Illness:    Sandra Strong is a 67 y.o. female with a hx of hyperlipidemia, current smoker x 50+ years, COPD presenting due to coronary artery calcifications.  Patient had a CT 03/2023 lung cancer screening showing LAD calcifications and aortic atherosclerosis.  She denies chest pain, has occasional shortness of breath which she attributes to COPD.  Inhalers recently change improving her breathing.  Recently had an abdominal hernia surgery successfully.  Overall feels well, no concerns at this time.  Past Medical History:  Diagnosis Date   Acute postoperative pain    Anemia    Anxiety    Arthritis    Asthma    Chronic BILATERAL sacroiliac joint pain    Chronic knee pain    Chronic pain    COPD (chronic obstructive pulmonary disease) (HCC)    Current every day smoker    DDD (degenerative disc disease), lumbar    Dyspnea    GERD (gastroesophageal reflux disease)    History of carpal tunnel release    Hypertension    Levoscoliosis    Lumbar facet arthropathy    Lumbar radiculitis    Lumbar stenosis with neurogenic claudication    Muscle cramps    Osteoarthritis    Osteoarthritis of facet joint of lumbar spine     Past Surgical History:  Procedure Laterality Date   ABDOMINAL HYSTERECTOMY     carpel tunnel Bilateral    COLONOSCOPY     DENTAL SURGERY     x 2   ESOPHAGOGASTRODUODENOSCOPY     FOOT SURGERY Left    HERNIA REPAIR     INSERTION OF MESH  06/24/2023   Procedure: INSERTION OF MESH;  Surgeon: Henrene Dodge, MD;  Location: [facility]  ORS;  Service: General;;   ROTATOR CUFF REPAIR Right    XI ROBOTIC ASSISTED VENTRAL HERNIA N/A 06/24/2023   Procedure: XI ROBOTIC ASSISTED VENTRAL HERNIA;  Surgeon: Henrene Dodge, MD;  Location: [facility] ORS;  Service: General;  Laterality: N/A;    Current Medications: Current Meds  Medication Sig   acetaminophen (TYLENOL) 500 MG tablet Take 500 mg by mouth every 6 (six) hours as needed. 2 tabs   acetaminophen (TYLENOL) 500 MG tablet Take 2 tablets (1,000 mg total) by mouth every 6 (six) hours as needed for mild pain.   albuterol (PROVENTIL HFA;VENTOLIN HFA) 108 (90 Base) MCG/ACT inhaler Inhale 2 puffs into the lungs every 4 (four) hours as needed for wheezing or shortness of breath.   albuterol (PROVENTIL) (2.5 MG/3ML) 0.083% nebulizer solution Take 2.5 mg by nebulization daily. A bedtime   alendronate (FOSAMAX) 70 MG tablet Take 70 mg by mouth once a week. Take with a full glass of water on an empty stomach.   Camphor-Menthol-Methyl Sal (SALONPAS) 3.12-05-08 % PTCH Apply topically as needed.   cetirizine (ZYRTEC) 10 MG tablet Take 10 mg by mouth daily.   Cholecalciferol (VITAMIN D3) 2000 units TABS Take 2,000 Units by mouth daily.   clopidogrel (PLAVIX) 75 MG tablet Take 1  tablet (75 mg total) by mouth daily.   Cranberry 500 MG CAPS Take 500 mg by mouth daily.   diphenhydrAMINE (BENADRYL) 25 mg capsule Take 50 mg by mouth daily. 2 tabs every am   docusate sodium (COLACE) 50 MG capsule Take 50 mg by mouth daily as needed.   etodolac (LODINE) 400 MG tablet Take 400 mg by mouth 2 (two) times daily.   fluticasone (FLONASE) 50 MCG/ACT nasal spray Place 2 sprays into both nostrils in the morning and at bedtime.   fluticasone furoate-vilanterol (BREO ELLIPTA) 100-25 MCG/ACT AEPB Inhale 1 puff into the lungs daily.   montelukast (SINGULAIR) 10 MG tablet Take 10 mg by mouth at bedtime.   Multiple Vitamins-Minerals (MULTIVITAMIN WITH MINERALS) tablet Take 1 tablet by mouth daily.   omeprazole (PRILOSEC)  40 MG capsule Take 40 mg by mouth daily.   OVER THE COUNTER MEDICATION Take 2 tablets by mouth daily. Prune lax   oxyCODONE (OXY IR/ROXICODONE) 5 MG immediate release tablet Take 1 tablet (5 mg total) by mouth every 4 (four) hours as needed for severe pain.   psyllium (METAMUCIL) 58.6 % powder Take 1 packet by mouth daily.   rosuvastatin (CRESTOR) 5 MG tablet Take 5 mg by mouth daily.   traMADol (ULTRAM) 50 MG tablet Take 50 mg by mouth every 12 (twelve) hours as needed.   traZODone (DESYREL) 50 MG tablet Take 50 mg by mouth at bedtime.   vitamin E 180 MG (400 UNITS) capsule Take 400 Units by mouth daily.     Allergies:   Spiriva handihaler [tiotropium bromide monohydrate], Advair diskus [fluticasone-salmeterol], Erythromycin, and Sulfa antibiotics   Social History   Socioeconomic History   Marital status: Single    Spouse name: Not on file   Number of children: Not on file   Years of education: Not on file   Highest education level: Not on file  Occupational History   Not on file  Tobacco Use   Smoking status: Every Day    Current packs/day: 1.50    Average packs/day: 1.5 packs/day for 54.2 years (81.2 ttl pk-yrs)    Types: Cigarettes    Start date: 05/12/1969    Passive exposure: Past   Smokeless tobacco: Never   Tobacco comments:    Information on quiting smoking given to pt, 1.5ppd currrently.  On 07/09/23-Reports has cut back to 18 cigarettes a day  Vaping Use   Vaping status: Never Used  Substance and Sexual Activity   Alcohol use: No   Drug use: Never   Sexual activity: Not on file  Other Topics Concern   Not on file  Social History Narrative   Lives with son   Social Determinants of Health   Financial Resource Strain: Not on file  Food Insecurity: Not on file  Transportation Needs: Not on file  Physical Activity: Not on file  Stress: Not on file  Social Connections: Not on file     Family History: The patient's family history includes Breast cancer in her  paternal aunt; Breast cancer (age of onset: 11) in her mother.  ROS:   Please see the history of present illness.     All other systems reviewed and are negative.  EKGs/Labs/Other Studies Reviewed:    The following studies were reviewed today:  EKG Interpretation Date/Time:  Friday July 09 2023 11:38:41 EDT Ventricular Rate:  76 PR Interval:  128 QRS Duration:  84 QT Interval:  352 QTC Calculation: 396 R Axis:   17  Text  Interpretation: Normal sinus rhythm with sinus arrhythmia Normal ECG Confirmed by Debbe Odea ([id number]) on 07/09/2023 11:42:39 AM    Recent Labs: 04/01/2023: ALT 16; BUN 18; Creatinine, Ser 0.71; Potassium 4.2; Sodium 138 06/16/2023: Hemoglobin 12.9; Platelets 317  Recent Lipid Panel No results found for: "CHOL", "TRIG", "HDL", "CHOLHDL", "VLDL", "LDLCALC", "LDLDIRECT"   Risk Assessment/Calculations:             Physical Exam:    VS:  BP 118/76 (BP Location: Left Arm, Patient Position: Sitting, Cuff Size: Normal)   Pulse 76   Ht 5' (1.524 m)   Wt 208 lb 12.8 oz (94.7 kg)   SpO2 96%   BMI 40.78 kg/m     Wt Readings from Last 3 Encounters:  07/09/23 208 lb 12.8 oz (94.7 kg)  07/01/23 204 lb 9.6 oz (92.8 kg)  06/24/23 204 lb (92.5 kg)     GEN:  Well nourished, well developed in no acute distress HEENT: Normal NECK: No JVD; No carotid bruits CARDIAC: RRR, no murmurs, rubs, gallops RESPIRATORY: Inspiratory wheezing noted ABDOMEN: Soft, non-tender, non-distended MUSCULOSKELETAL:  No edema; No deformity  SKIN: Warm and dry NEUROLOGIC:  Alert and oriented x 3 PSYCHIATRIC:  Normal affect   ASSESSMENT:    1. Coronary artery disease involving native heart without angina pectoris, unspecified vessel or lesion type   2. Smoking   3. Pure hypercholesterolemia    PLAN:    In order of problems listed above:  CAD/LAD calcifications noted on chest CT.  Patient denies chest pain.  Obtain echo to rule out any structural abnormalities.  Start Plavix  75 mg, continue Crestor.  Obtain lipid profile.  Consider ischemic workup if patient becomes symptomatic or EF is reduced.  Patient on chronic nonsteroidals for arthritis. Current smoker, smoking cessation advised. Hyperlipidemia, LDL goal less than 70.  Obtain lipid profile.  Continue Crestor 5 mg daily for now.  Follow-up after echocardiogram      Medication Adjustments/Labs and Tests Ordered: Current medicines are reviewed at length with the patient today.  Concerns regarding medicines are outlined above.  Orders Placed This Encounter  Procedures   Lipid panel   EKG 12-Lead   ECHOCARDIOGRAM COMPLETE   Meds ordered this encounter  Medications   clopidogrel (PLAVIX) 75 MG tablet    Sig: Take 1 tablet (75 mg total) by mouth daily.    Dispense:  90 tablet    Refill:  3    Patient Instructions  Medication Instructions:   START Plavix - Take one tablet (75mg ) by mouth daily.   *If you need a refill on your cardiac medications before your next appointment, please call your pharmacy*   Lab Work:  Your physician recommends you have lab work today - LIPID  If you have labs (blood work) drawn today and your tests are completely normal, you will receive your results only by: MyChart Message (if you have MyChart) OR A paper copy in the mail If you have any lab test that is abnormal or we need to change your treatment, we will call you to review the results.   Testing/Procedures:  Your physician has requested that you have an echocardiogram. Echocardiography is a painless test that uses sound waves to create images of your heart. It provides your doctor with information about the size and shape of your heart and how well your heart's chambers and valves are working. This procedure takes approximately one hour. There are no restrictions for this procedure. Please do NOT wear cologne,  perfume, aftershave, or lotions (deodorant is allowed). Please arrive 15 minutes prior to your  appointment time.    Follow-Up: At [facility], you and your health needs are our priority.  As part of our continuing mission to provide you with exceptional heart care, we have created designated Provider Care Teams.  These Care Teams include your primary Cardiologist (physician) and Advanced Practice Providers (APPs -  Physician Assistants and Nurse Practitioners) who all work together to provide you with the care you need, when you need it.  We recommend signing up for the patient portal called "MyChart".  Sign up information is provided on this After Visit Summary.  MyChart is used to connect with patients for Virtual Visits (Telemedicine).  Patients are able to view lab/test results, encounter notes, upcoming appointments, etc.  Non-urgent messages can be sent to your provider as well.   To learn more about what you can do with MyChart, go to [email or web address].    Your next appointment:    After Echocardiogram  Provider:   You may see Debbe Odea, MD or one of the following Advanced Practice Providers on your designated Care Team:   Nicolasa Ducking, NP Eula Listen, PA-C Cadence Fransico Michael, PA-C Charlsie Quest, NP    Signed, Debbe Odea, MD  07/09/2023 1:19 PM    [facility]

## 2023-07-09 NOTE — Patient Instructions (Signed)
Medication Instructions:   START Plavix - Take one tablet (75mg ) by mouth daily.   *If you need a refill on your cardiac medications before your next appointment, please call your pharmacy*   Lab Work:  Your physician recommends you have lab work today - LIPID  If you have labs (blood work) drawn today and your tests are completely normal, you will receive your results only by: MyChart Message (if you have MyChart) OR A paper copy in the mail If you have any lab test that is abnormal or we need to change your treatment, we will call you to review the results.   Testing/Procedures:  Your physician has requested that you have an echocardiogram. Echocardiography is a painless test that uses sound waves to create images of your heart. It provides your doctor with information about the size and shape of your heart and how well your heart's chambers and valves are working. This procedure takes approximately one hour. There are no restrictions for this procedure. Please do NOT wear cologne, perfume, aftershave, or lotions (deodorant is allowed). Please arrive 15 minutes prior to your appointment time.    Follow-Up: At [facility], you and your health needs are our priority.  As part of our continuing mission to provide you with exceptional heart care, we have created designated Provider Care Teams.  These Care Teams include your primary Cardiologist (physician) and Advanced Practice Providers (APPs -  Physician Assistants and Nurse Practitioners) who all work together to provide you with the care you need, when you need it.  We recommend signing up for the patient portal called "MyChart".  Sign up information is provided on this After Visit Summary.  MyChart is used to connect with patients for Virtual Visits (Telemedicine).  Patients are able to view lab/test results, encounter notes, upcoming appointments, etc.  Non-urgent messages can be sent to your provider as well.   To learn  more about what you can do with MyChart, go to [email or web address].    Your next appointment:    After Echocardiogram  Provider:   You may see Debbe Odea, MD or one of the following Advanced Practice Providers on your designated Care Team:   Nicolasa Ducking, NP Eula Listen, PA-C Cadence Fransico Michael, PA-C Charlsie Quest, NP

## 2023-07-15 MED ORDER — ROSUVASTATIN CALCIUM 10 MG PO TABS: 10.0000 mg | ORAL_TABLET | Freq: Every day | ORAL | 0 refills | Status: DC

## 2023-07-15 NOTE — Addendum Note (Signed)
Addended by: Margrett Rud on: 07/15/2023 03:09 PM   Modules accepted: Orders

## 2023-07-16 ENCOUNTER — Encounter: Payer: Self-pay | Admitting: Surgery

## 2023-07-16 ENCOUNTER — Ambulatory Visit (INDEPENDENT_AMBULATORY_CARE_PROVIDER_SITE_OTHER): Payer: Medicare HMO | Admitting: Surgery

## 2023-07-16 VITALS — BP 151/103 | HR 101 | Temp 98.3°F | Ht 60.0 in | Wt 204.8 lb

## 2023-07-16 DIAGNOSIS — K436 Other and unspecified ventral hernia with obstruction, without gangrene: Secondary | ICD-10-CM | POA: Diagnosis not present

## 2023-07-16 DIAGNOSIS — K420 Umbilical hernia with obstruction, without gangrene: Secondary | ICD-10-CM

## 2023-07-16 DIAGNOSIS — Z09 Encounter for follow-up examination after completed treatment for conditions other than malignant neoplasm: Secondary | ICD-10-CM

## 2023-07-16 NOTE — Patient Instructions (Signed)

## 2023-07-16 NOTE — Progress Notes (Signed)
07/16/2023  History of Present Illness: Sandra Strong is a 67 y.o. female s/p robotic assisted umbilical and supraumbilical hernia repair on 06/24/23.  Total defect length was 3.5 cm.  She presents today for follow up.  She reports that she's doing better now from the LUQ site.  The bruising and pain are improved.  She does have some soreness at the hernia repair site, but denies any worsening pain.  She's able to move/mobilize better.  Past Medical History: Past Medical History:  Diagnosis Date   Acute postoperative pain    Anemia    Anxiety    Arthritis    Asthma    Chronic BILATERAL sacroiliac joint pain    Chronic knee pain    Chronic pain    COPD (chronic obstructive pulmonary disease) (HCC)    Current every day smoker    DDD (degenerative disc disease), lumbar    Dyspnea    GERD (gastroesophageal reflux disease)    History of carpal tunnel release    Hypertension    Levoscoliosis    Lumbar facet arthropathy    Lumbar radiculitis    Lumbar stenosis with neurogenic claudication    Muscle cramps    Osteoarthritis    Osteoarthritis of facet joint of lumbar spine      Past Surgical History: Past Surgical History:  Procedure Laterality Date   ABDOMINAL HYSTERECTOMY     carpel tunnel Bilateral    COLONOSCOPY     DENTAL SURGERY     x 2   ESOPHAGOGASTRODUODENOSCOPY     FOOT SURGERY Left    HERNIA REPAIR     INSERTION OF MESH  06/24/2023   Procedure: INSERTION OF MESH;  Surgeon: Henrene Dodge, MD;  Location: [facility] ORS;  Service: General;;   ROTATOR CUFF REPAIR Right    XI ROBOTIC ASSISTED VENTRAL HERNIA N/A 06/24/2023   Procedure: XI ROBOTIC ASSISTED VENTRAL HERNIA;  Surgeon: Henrene Dodge, MD;  Location: [facility] ORS;  Service: General;  Laterality: N/A;    Home Medications: Prior to Admission medications   Medication Sig Start Date End Date Taking? Authorizing Provider  acetaminophen (TYLENOL) 500 MG tablet Take 500 mg by mouth every 6 (six) hours as needed. 2 tabs   Yes  [provider]  albuterol (PROVENTIL HFA;VENTOLIN HFA) 108 (90 Base) MCG/ACT inhaler Inhale 2 puffs into the lungs every 4 (four) hours as needed for wheezing or shortness of breath.   Yes [provider]  albuterol (PROVENTIL) (2.5 MG/3ML) 0.083% nebulizer solution Take 2.5 mg by nebulization daily. A bedtime   Yes [provider]  alendronate (FOSAMAX) 70 MG tablet Take 70 mg by mouth once a week. Take with a full glass of water on an empty stomach.   Yes [provider]  Camphor-Menthol-Methyl Sal (SALONPAS) 3.12-05-08 % PTCH Apply topically as needed.   Yes [provider]  cetirizine (ZYRTEC) 10 MG tablet Take 10 mg by mouth daily.   Yes [provider]  Cholecalciferol (VITAMIN D3) 2000 units TABS Take 2,000 Units by mouth daily.   Yes [provider]  clopidogrel (PLAVIX) 75 MG tablet Take 1 tablet (75 mg total) by mouth daily. 07/09/23  Yes Agbor-Etang, Arlys John, MD  Cranberry 500 MG CAPS Take 500 mg by mouth daily.   Yes [provider]  diphenhydrAMINE (BENADRYL) 25 mg capsule Take 50 mg by mouth daily. 2 tabs every am   Yes [provider]  docusate sodium (COLACE) 50 MG capsule Take 50 mg by mouth  daily as needed.   Yes [provider]  etodolac (LODINE) 400 MG tablet Take 400 mg by mouth 2 (two) times daily.   Yes [provider]  fluticasone (FLONASE) 50 MCG/ACT nasal spray Place 2 sprays into both nostrils in the morning and at bedtime.   Yes [provider]  fluticasone furoate-vilanterol (BREO ELLIPTA) 100-25 MCG/ACT AEPB Inhale 1 puff into the lungs daily.   Yes [provider]  montelukast (SINGULAIR) 10 MG tablet Take 10 mg by mouth at bedtime.   Yes [provider]  Multiple Vitamins-Minerals (MULTIVITAMIN WITH MINERALS) tablet Take 1 tablet by mouth daily.   Yes [provider]  omeprazole (PRILOSEC) 40 MG capsule Take 40 mg by mouth daily.   Yes  [provider]  OVER THE COUNTER MEDICATION Take 2 tablets by mouth daily. Prune lax   Yes [provider]  psyllium (METAMUCIL) 58.6 % powder Take 1 packet by mouth daily.   Yes [provider]  rosuvastatin (CRESTOR) 10 MG tablet Take 1 tablet (10 mg total) by mouth daily. 07/15/23  Yes Agbor-Etang, Arlys John, MD  traMADol (ULTRAM) 50 MG tablet Take 50 mg by mouth every 12 (twelve) hours as needed.   Yes [provider]  vitamin E 180 MG (400 UNITS) capsule Take 400 Units by mouth daily.   Yes [provider]    Allergies: Allergies  Allergen Reactions   Spiriva Handihaler [Tiotropium Bromide Monohydrate] Shortness Of Breath    cough   Advair Diskus [Fluticasone-Salmeterol] Cough    sob   Erythromycin    Sulfa Antibiotics     Review of Systems: Review of Systems  Constitutional:  Negative for chills and fever.  Respiratory:  Negative for shortness of breath.   Cardiovascular:  Negative for chest pain.  Gastrointestinal:  Positive for abdominal pain (postoperative). Negative for nausea and vomiting.  Genitourinary:  Negative for dysuria.    Physical Exam BP (!) 151/103   Pulse (!) 101   Temp 98.3 F (36.8 C) (Oral)   Ht 5' (1.524 m)   Wt 204 lb 12.8 oz (92.9 kg)   SpO2 98%   BMI 40.00 kg/m  CONSTITUTIONAL: No acute distress HEENT:  Normocephalic, atraumatic, extraocular motion intact. RESPIRATORY:  Normal respiratory effort without pathologic use of accessory muscles. CARDIOVASCULAR: Regular rhythm and rate. GI: The abdomen is soft, non-distended, appropriately sore.  Left sided incisions are healing well and are clean, dry, intact.  Minimal resolving ecchymosis in LUQ site.  Midline area is intact without any hernia recurrence. NEUROLOGIC:  Motor and sensation is grossly normal.  Cranial nerves are grossly intact. PSYCH:  Alert and oriented to person, place and time. Affect is normal.   Assessment and Plan: This is a 67 y.o.  female s/p robotic assisted umbilical and supraumbilical hernia repair.  --Patient is doing well after surgery and her pain has been improving.  There is some soreness in the midline which may be due to her activity level increasing recently.  Reminded her of activity restrictions and wearing her abdominal binder.  Otherwise, no evidence of hernia recurrence or any complications with the incisions/mesh placement. --Follow up as needed.  I spent 20 minutes dedicated to the care of this patient on the date of this encounter to include pre-visit review of records, face-to-face time with the patient discussing diagnosis and management, and any post-visit coordination of care.   Howie Ill, MD [facility]

## 2023-08-06 ENCOUNTER — Ambulatory Visit: Payer: Medicare HMO | Attending: Cardiology

## 2023-08-06 DIAGNOSIS — I2510 Atherosclerotic heart disease of native coronary artery without angina pectoris: Secondary | ICD-10-CM

## 2023-08-06 LAB — ECHOCARDIOGRAM COMPLETE: Area-P 1/2: 4.49 cm2

## 2023-08-06 MED ORDER — PERFLUTREN LIPID MICROSPHERE
1.0000 mL | INTRAVENOUS | Status: AC | PRN
Start: 2023-08-06 — End: 2023-08-06
  Administered 2023-08-06: 4 mL via INTRAVENOUS

## 2023-08-17 ENCOUNTER — Telehealth: Payer: Self-pay | Admitting: Cardiology

## 2023-08-17 ENCOUNTER — Ambulatory Visit: Payer: Medicare HMO | Admitting: Physician Assistant

## 2023-08-17 MED ORDER — ROSUVASTATIN CALCIUM 10 MG PO TABS: 10.0000 mg | ORAL_TABLET | Freq: Every day | ORAL | 3 refills | Status: DC

## 2023-08-17 NOTE — Telephone Encounter (Signed)
*  STAT* If patient is at the pharmacy, call can be transferred to refill team.   1. Which medications need to be refilled? (please list name of each medication and dose if known) rosuvastatin (CRESTOR) 10 MG tablet   2. Which pharmacy/location (including street and city if local pharmacy) is medication to be sent to? [facility] - [place], [place] - [address]   3. Do they need a 30 day or 90 day supply? 90

## 2023-08-17 NOTE — Telephone Encounter (Signed)
Pt's medication was sent to pt's pharmacy as requested. Confirmation received.  °

## 2023-08-27 ENCOUNTER — Other Ambulatory Visit: Payer: Self-pay

## 2023-08-27 ENCOUNTER — Emergency Department
Admission: EM | Admit: 2023-08-27 | Discharge: 2023-08-27 | Disposition: A | Discharge status: Home / Self Care | Payer: Medicare HMO | Attending: Emergency Medicine | Admitting: Emergency Medicine

## 2023-08-27 ENCOUNTER — Encounter: Payer: Self-pay | Admitting: Emergency Medicine

## 2023-08-27 ENCOUNTER — Emergency Department: Payer: Medicare HMO

## 2023-08-27 DIAGNOSIS — I10 Essential (primary) hypertension: Secondary | ICD-10-CM | POA: Insufficient documentation

## 2023-08-27 DIAGNOSIS — J449 Chronic obstructive pulmonary disease, unspecified: Secondary | ICD-10-CM | POA: Diagnosis not present

## 2023-08-27 DIAGNOSIS — Z79899 Other long term (current) drug therapy: Secondary | ICD-10-CM | POA: Insufficient documentation

## 2023-08-27 DIAGNOSIS — R079 Chest pain, unspecified: Secondary | ICD-10-CM

## 2023-08-27 DIAGNOSIS — J45909 Unspecified asthma, uncomplicated: Secondary | ICD-10-CM | POA: Diagnosis not present

## 2023-08-27 LAB — COMPREHENSIVE METABOLIC PANEL
ALT: 14 U/L (ref 0–44)
AST: 18 U/L (ref 15–41)
Albumin: 3.7 g/dL (ref 3.5–5.0)
Alkaline Phosphatase: 102 U/L (ref 38–126)
Anion gap: 12 (ref 5–15)
BUN: 14 mg/dL (ref 8–23)
CO2: 23 mmol/L (ref 22–32)
Calcium: 9.3 mg/dL (ref 8.9–10.3)
Chloride: 104 mmol/L (ref 98–111)
Creatinine, Ser: 0.7 mg/dL (ref 0.44–1.00)
GFR, Estimated: >60 mL/min (ref >60)
Glucose, Bld: 107 mg/dL — ABNORMAL HIGH (ref 70–99)
Potassium: 3.9 mmol/L (ref 3.5–5.1)
Sodium: 139 mmol/L (ref 135–145)
Total Bilirubin: 0.7 mg/dL (ref 0.3–1.2)
Total Protein: 7 g/dL (ref 6.5–8.1)

## 2023-08-27 LAB — TROPONIN I (HIGH SENSITIVITY)
Troponin I (High Sensitivity): 3 ng/L (ref <18)
Troponin I (High Sensitivity): 4 ng/L (ref <18)

## 2023-08-27 LAB — CBC
HCT: 38.4 % (ref 36.0–46.0)
Hemoglobin: 12.8 g/dL (ref 12.0–15.0)
MCH: 28.7 pg (ref 26.0–34.0)
MCHC: 33.3 g/dL (ref 30.0–36.0)
MCV: 86.1 fL (ref 80.0–100.0)
Platelets: 308 10*3/uL (ref 150–400)
RBC: 4.46 MIL/uL (ref 3.87–5.11)
RDW: 15.4 % (ref 11.5–15.5)
WBC: 12.7 10*3/uL — ABNORMAL HIGH (ref 4.0–10.5)
nRBC: 0 % (ref 0.0–0.2)

## 2023-08-27 LAB — LIPASE, BLOOD: Lipase: 36 U/L (ref 11–51)

## 2023-08-27 LAB — D-DIMER, QUANTITATIVE: D-Dimer, Quant: 0.3 ug{FEU}/mL (ref 0.00–0.50)

## 2023-08-27 MED ORDER — MORPHINE SULFATE (PF) 4 MG/ML IV SOLN
4.0000 mg | Freq: Once | INTRAVENOUS | Status: AC
  Administered 2023-08-27: 4 mg via INTRAVENOUS
  Filled 2023-08-27: qty 1

## 2023-08-27 MED ORDER — ONDANSETRON HCL 4 MG/2ML IJ SOLN
4.0000 mg | Freq: Once | INTRAMUSCULAR | Status: AC
  Administered 2023-08-27: 4 mg via INTRAVENOUS
  Filled 2023-08-27: qty 2

## 2023-08-27 MED ORDER — ASPIRIN 81 MG PO CHEW
324.0000 mg | CHEWABLE_TABLET | Freq: Once | ORAL | Status: AC
  Administered 2023-08-27: 324 mg via ORAL
  Filled 2023-08-27: qty 4

## 2023-08-27 NOTE — ED Triage Notes (Signed)
Pt presents ambulatory to triage via POV with complaints of generalized CP that started initially as back pain on yesterday and has progressed this AM into pain. Pt rates the pain an 8/10. A&Ox4 at this time. Denies fevers, chills, N/V or SOB.

## 2023-08-27 NOTE — ED Provider Notes (Signed)
[facility] Provider Note    Event Date/Time   First MD Initiated Contact with Patient 08/27/23 [phone]     (approximate)   History   Chest Pain   HPI  Sandra Strong is a 67 y.o. female who presents to the ED from home with a chief complaint of chest pain.  Patient reports "colon pain which progressed to her entire back yesterday and was awakened from sleep with pain across her entire chest which she describes as both sharp and pressure.  Denies associated diaphoresis, shortness of breath, palpitations, nausea or vomiting.  Denies recent fever/chills, cough, abdominal pain, dysuria or dizziness.     Past Medical History   Past Medical History:  Diagnosis Date   Acute postoperative pain    Anemia    Anxiety    Arthritis    Asthma    Chronic BILATERAL sacroiliac joint pain    Chronic knee pain    Chronic pain    COPD (chronic obstructive pulmonary disease) (HCC)    Current every day smoker    DDD (degenerative disc disease), lumbar    Dyspnea    GERD (gastroesophageal reflux disease)    History of carpal tunnel release    Hypertension    Levoscoliosis    Lumbar facet arthropathy    Lumbar radiculitis    Lumbar stenosis with neurogenic claudication    Muscle cramps    Osteoarthritis    Osteoarthritis of facet joint of lumbar spine      Active Problem List   Patient Active Problem List   Diagnosis Date Noted   Incarcerated umbilical hernia 06/24/2023   Incarcerated ventral hernia 06/24/2023   Sacroiliitis (HCC) 08/15/2019   Chronic hip pain (Left) 08/17/2018   Lumbar spondylosis 07/11/2018   Acute postoperative pain 05/05/2018   Chronic sacroiliac joint pain (Bilateral) (R>L) 02/16/2018   Spondylosis without myelopathy or radiculopathy, lumbosacral region 02/16/2018   Lumbar facet syndrome (Bilateral) (R>L) 02/16/2018   Other specified dorsopathies, sacral and sacrococcygeal region 02/16/2018   Levoscoliosis 02/16/2018   Lumbar facet  arthropathy 02/16/2018   Osteoarthritis of facet joint of lumbar spine (L5-S1) (Bilateral) 02/16/2018   Osteoarthritis of lumbar spine 02/16/2018   Osteoarthritis of ankle (Left) 02/16/2018   DDD (degenerative disc disease), lumbar 02/16/2018   GERD (gastroesophageal reflux disease) 02/16/2018   Muscle cramps 02/16/2018   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   Coccygodynia 02/16/2018   Sacrococcygeal pain 02/16/2018   Spondylosis without myelopathy or radiculopathy, sacral and sacrococcygeal region 02/16/2018   Elevated C-reactive protein (CRP) 01/10/2018   Chronic low back pain (Primary Area of Pain) (Bilateral) (R>L) 01/06/2018   Chronic ankle pain (Secondary Area of Pain) (Bilateral) (R>L) 01/06/2018   Chronic knee pain (Tertiary Area of Pain) (Bilateral) (L>R) 01/06/2018   Chronic pain syndrome 01/06/2018   Opiate use 01/06/2018   Pharmacologic therapy 01/06/2018   Disorder of skeletal system 01/06/2018   Problems influencing health status 01/06/2018     Past Surgical History   Past Surgical History:  Procedure Laterality Date   ABDOMINAL HYSTERECTOMY     carpel tunnel Bilateral    COLONOSCOPY     DENTAL SURGERY     x 2   ESOPHAGOGASTRODUODENOSCOPY     FOOT SURGERY Left    HERNIA REPAIR     INSERTION OF MESH  06/24/2023   Procedure: INSERTION OF MESH;  Surgeon: Henrene Dodge, MD;  Location: [facility] ORS;  Service: General;;   ROTATOR CUFF REPAIR Right    XI  ROBOTIC ASSISTED VENTRAL HERNIA N/A 06/24/2023   Procedure: XI ROBOTIC ASSISTED VENTRAL HERNIA;  Surgeon: Henrene Dodge, MD;  Location: [facility] ORS;  Service: General;  Laterality: N/A;     Home Medications   Prior to Admission medications   Medication Sig Start Date End Date Taking? Authorizing Provider  acetaminophen (TYLENOL) 500 MG tablet Take 500 mg by mouth every 6 (six) hours as needed. 2 tabs    [provider]  albuterol (PROVENTIL HFA;VENTOLIN HFA) 108 (90 Base) MCG/ACT inhaler Inhale 2 puffs  into the lungs every 4 (four) hours as needed for wheezing or shortness of breath.    [provider]  albuterol (PROVENTIL) (2.5 MG/3ML) 0.083% nebulizer solution Take 2.5 mg by nebulization daily. A bedtime    [provider]  alendronate (FOSAMAX) 70 MG tablet Take 70 mg by mouth once a week. Take with a full glass of water on an empty stomach.    [provider]  Camphor-Menthol-Methyl Sal (SALONPAS) 3.12-05-08 % PTCH Apply topically as needed.    [provider]  cetirizine (ZYRTEC) 10 MG tablet Take 10 mg by mouth daily.    [provider]  Cholecalciferol (VITAMIN D3) 2000 units TABS Take 2,000 Units by mouth daily.    [provider]  clopidogrel (PLAVIX) 75 MG tablet Take 1 tablet (75 mg total) by mouth daily. 07/09/23   Debbe Odea, MD  Cranberry 500 MG CAPS Take 500 mg by mouth daily.    [provider]  diphenhydrAMINE (BENADRYL) 25 mg capsule Take 50 mg by mouth daily. 2 tabs every am    [provider]  docusate sodium (COLACE) 50 MG capsule Take 50 mg by mouth daily as needed.    [provider]  etodolac (LODINE) 400 MG tablet Take 400 mg by mouth 2 (two) times daily.    [provider]  fluticasone (FLONASE) 50 MCG/ACT nasal spray Place 2 sprays into both nostrils in the morning and at bedtime.    [provider]  fluticasone furoate-vilanterol (BREO ELLIPTA) 100-25 MCG/ACT AEPB Inhale 1 puff into the lungs daily.    [provider]  montelukast (SINGULAIR) 10 MG tablet Take 10 mg by mouth at bedtime.    [provider]  Multiple Vitamins-Minerals (MULTIVITAMIN WITH MINERALS) tablet Take 1 tablet by mouth daily.    [provider]  omeprazole (PRILOSEC) 40 MG capsule Take 40 mg by mouth daily.    [provider]  OVER THE COUNTER MEDICATION Take 2 tablets by mouth daily. Prune lax    [provider]  psyllium (METAMUCIL) 58.6 % powder  Take 1 packet by mouth daily.    [provider]  rosuvastatin (CRESTOR) 10 MG tablet Take 1 tablet (10 mg total) by mouth daily. 08/17/23   Debbe Odea, MD  traMADol (ULTRAM) 50 MG tablet Take 50 mg by mouth every 12 (twelve) hours as needed.    [provider]  vitamin E 180 MG (400 UNITS) capsule Take 400 Units by mouth daily.    [provider]     Allergies  Spiriva handihaler [tiotropium bromide monohydrate], Advair diskus [fluticasone-salmeterol], Erythromycin, and Sulfa antibiotics   Family History   Family History  Problem Relation Age of Onset   Breast cancer Mother 36   Breast cancer Paternal Aunt      Physical Exam  Triage Vital Signs: ED Triage Vitals  Encounter Vitals Group     BP 08/27/23 0545 (!) 175/106  Systolic BP Percentile --      Diastolic BP Percentile --      Pulse Rate 08/27/23 0545 99     Resp 08/27/23 0545 18     Temp 08/27/23 0545 98.3 F (36.8 C)     Temp Source 08/27/23 0545 Oral     SpO2 08/27/23 0545 100 %     Weight 08/27/23 0544 204 lb (92.5 kg)     Height 08/27/23 0544 5' (1.524 m)     Head Circumference --      Peak Flow --      Pain Score 08/27/23 0544 8     Pain Loc --      Pain Education --      Exclude from Growth Chart --     Updated Vital Signs: BP (!) 140/119   Pulse 94   Temp 98.3 F (36.8 C) (Oral)   Resp 18   Ht 5' (1.524 m)   Wt 92.5 kg   SpO2 99%   BMI 39.84 kg/m    General: Awake, no distress.  CV:  RRR.  Good peripheral perfusion.  Resp:  Normal effort.  CTAB. Abd:  Nontender.  No distention.  Other:  Bilateral calves are supple without tenderness.   ED Results / Procedures / Treatments  Labs (all labs ordered are listed, but only abnormal results are displayed) Labs Reviewed  CBC - Abnormal; Notable for the following components:      Result Value   WBC 12.7 (*)    All other components within normal limits  COMPREHENSIVE METABOLIC PANEL - Abnormal; Notable for  the following components:   Glucose, Bld 107 (*)    All other components within normal limits  LIPASE, BLOOD  D-DIMER, QUANTITATIVE  TROPONIN I (HIGH SENSITIVITY)     EKG  ED ECG REPORT I, Hayven Croy J, the attending physician, personally viewed and interpreted this ECG.   Date: 08/27/2023  EKG Time: 0550  Rate: 98  Rhythm: normal sinus rhythm  Axis: Normal  Intervals:none  ST&T Change: Nonspecific    RADIOLOGY I have independently visualized and interpreted patient's imaging study as well as noted the radiology interpretation:  X-ray wet read: No acute cardiopulmonary process  Official radiology report(s): No results found.   PROCEDURES:  Critical Care performed: No  .1-3 Lead EKG Interpretation  Performed by: Irean Hong, MD Authorized by: Irean Hong, MD     Interpretation: normal     ECG rate:  95   ECG rate assessment: normal     Rhythm: sinus rhythm     Ectopy: none     Conduction: normal   Comments:     Patient placed on cardiac monitor to evaluate for arrhythmias    MEDICATIONS ORDERED IN ED: Medications  morphine (PF) 4 MG/ML injection 4 mg (4 mg Intravenous Given 08/27/23 0603)  ondansetron (ZOFRAN) injection 4 mg (4 mg Intravenous Given 08/27/23 0602)  aspirin chewable tablet 324 mg (324 mg Oral Given 08/27/23 0603)     IMPRESSION / MDM / ASSESSMENT AND PLAN / ED COURSE  I reviewed the triage vital signs and the nursing notes.                             67 year old female presenting with abdominal, back and mainly chest pain. Differential diagnosis includes, but is not limited to, ACS, aortic dissection, pulmonary embolism, cardiac tamponade, pneumothorax, pneumonia, pericarditis, myocarditis, GI-related causes including esophagitis/gastritis, and  musculoskeletal chest wall pain.   I personally reviewed patient's records and note a general surgery office visit on 07/16/2023 for follow-up incarcerated umbilical hernia surgery.  Patient's  presentation is most consistent with acute presentation with potential threat to life or bodily function.  The patient is on the cardiac monitor to evaluate for evidence of arrhythmia and/or significant heart rate changes.  Obtain cardiac panel, add LFTs/lipase, D-dimer, chest x-ray.  Administer aspirin, Morphine and reassess.  Clinical Course as of 08/27/23 0649  Fri Aug 27, 2023  1610 Pain improved.  Blood pressure improved 165/89.  Updated patient and family member of all test results thus far including negative troponin and D-dimer.  Awaiting repeat troponin.  If negative I anticipate patient may be safely discharged home.  Care will be transferred to the oncoming provider at change of shift. [JS]    Clinical Course User Index [JS] Irean Hong, MD     FINAL CLINICAL IMPRESSION(S) / ED DIAGNOSES   Final diagnoses:  Chest pain, unspecified type  Hypertension, unspecified type     Rx / DC Orders   ED Discharge Orders     None        Note:  This document was prepared using Dragon voice recognition software and may include unintentional dictation errors.   Irean Hong, MD 08/27/23 [phone]

## 2023-08-27 NOTE — ED Provider Notes (Addendum)
  Physical Exam  BP (!) 169/85   Pulse 87   Temp 98.3 F (36.8 C) (Oral)   Resp (!) 22   Ht 5' (1.524 m)   Wt 92.5 kg   SpO2 100%   BMI 39.84 kg/m   Physical Exam I have reviewed the vital signs. General:  Awake, alert, no acute distress. Head:  Normocephalic, Atraumatic. EENT:  PERRL, EOMI, Oral mucosa pink and moist, Neck is supple. Cardiovascular: Regular rate, 2+ distal pulses. Respiratory:  Normal respiratory effort, symmetrical expansion, no distress.   Extremities:  Moving all four extremities through full ROM without pain.   Neuro:  Alert and oriented.  Interacting appropriately.   Skin:  Warm, dry, no rash.   Psych: Appropriate affect.   Procedures  Procedures  ED Course / MDM   Clinical Course as of 08/27/23 0727  Fri Aug 27, 2023  3086 Pain improved.  Blood pressure improved 165/89.  Updated patient and family member of all test results thus far including negative troponin and D-dimer.  Awaiting repeat troponin.  If negative I anticipate patient may be safely discharged home.  Care will be transferred to the oncoming provider at change of shift. [JS]    Clinical Course User Index [JS] Irean Hong, MD   Medical Decision Making Amount and/or Complexity of Data Reviewed Labs: ordered. Radiology: ordered.  Risk OTC drugs. Prescription drug management.   Patient is a 67 year old female who received in signout for chest pain.  Had her typical "colon pain" yesterday that progressed into chest pain this morning.  No other associated symptoms with it.  Initial EKG reassuring and troponin negative.  D-dimer is negative.  Laboratory workup otherwise reassuring and pain symptoms improved with morphine.  Plan for repeat troponin and if negative can be discharged home.  Second troponin negative.  Reassessed patient with no ongoing pain complaints.  Will discharge at this time with follow-up with PCP and cardiologist.     Janith Lima, MD 08/27/23 [id number]    Janith Lima, MD 08/27/23 [phone]

## 2023-08-27 NOTE — ED Notes (Signed)
ED Provider at bedside. 

## 2023-08-27 NOTE — ED Notes (Signed)
X-ray at bedside

## 2023-09-10 ENCOUNTER — Encounter: Payer: Self-pay | Admitting: Medical

## 2023-09-10 ENCOUNTER — Ambulatory Visit: Payer: Medicare HMO | Attending: Physician Assistant | Admitting: Medical

## 2023-09-10 VITALS — BP 140/102 | HR 83 | Ht 60.0 in | Wt 201.4 lb

## 2023-09-10 DIAGNOSIS — Z79899 Other long term (current) drug therapy: Secondary | ICD-10-CM

## 2023-09-10 DIAGNOSIS — E782 Mixed hyperlipidemia: Secondary | ICD-10-CM

## 2023-09-10 DIAGNOSIS — F17200 Nicotine dependence, unspecified, uncomplicated: Secondary | ICD-10-CM

## 2023-09-10 DIAGNOSIS — I10 Essential (primary) hypertension: Secondary | ICD-10-CM | POA: Diagnosis not present

## 2023-09-10 DIAGNOSIS — R072 Precordial pain: Secondary | ICD-10-CM | POA: Diagnosis not present

## 2023-09-10 DIAGNOSIS — I2510 Atherosclerotic heart disease of native coronary artery without angina pectoris: Secondary | ICD-10-CM | POA: Diagnosis not present

## 2023-09-10 DIAGNOSIS — IMO0001 Reserved for inherently not codable concepts without codable children: Secondary | ICD-10-CM

## 2023-09-10 MED ORDER — METOPROLOL TARTRATE 100 MG PO TABS: ORAL_TABLET | ORAL | 0 refills | Status: DC

## 2023-09-10 MED ORDER — CARVEDILOL 6.25 MG PO TABS: 6.2500 mg | ORAL_TABLET | Freq: Two times a day (BID) | ORAL | 3 refills | Status: DC

## 2023-09-10 NOTE — Patient Instructions (Addendum)
Medication Instructions:  Your physician recommends the following medication changes.  START TAKING: Carvedilol 6.25 mg by mouth twice a day   *If you need a refill on your cardiac medications before your next appointment, please call your pharmacy*   Lab Work: Your provider would like for you to return in 1 week prior to Cardiac CTA to have the following labs drawn: (BMP).   Please go to [facility] [address] ([facility] [address], [place] [address] You do not need an appointment.  They are open from 7:30 am-4 pm.  Lunch from 1:00 pm- 2:00 pm You will not need to be fasting.   Testing/Procedures: Cardiac CT Angiography (CTA), is a special type of CT scan that uses a computer to produce multi-dimensional views of major blood vessels throughout the body. In CT angiography, a contrast material is injected through an IV to help visualize the blood vessels Please see instruction below    Follow-Up: At [facility], you and your health needs are our priority.  As part of our continuing mission to provide you with exceptional heart care, we have created designated Provider Care Teams.  These Care Teams include your primary Cardiologist (physician) and Advanced Practice Providers (APPs -  Physician Assistants and Nurse Practitioners) who all work together to provide you with the care you need, when you need it.  We recommend signing up for the patient portal called "MyChart".  Sign up information is provided on this After Visit Summary.  MyChart is used to connect with patients for Virtual Visits (Telemedicine).  Patients are able to view lab/test results, encounter notes, upcoming appointments, etc.  Non-urgent messages can be sent to your provider as well.   To learn more about what you can do with MyChart, go to [email or web address].    Your next appointment:   3 month(s)  Provider:   You may see Debbe Odea, MD or one of the following  Advanced Practice Providers on your designated Care Team:   Nicolasa Ducking, NP Eula Listen, PA-C Cadence Fransico Michael, PA-C Charlsie Quest, NP      Your cardiac CT will be scheduled at one of the below locations:   [facility] [address] [place], [place] [address] [phone]  OR   [facility] [address] [place], [place] [address] [phone]  If scheduled at [facility], please arrive at the [facility] (Entrance C2) of [facility] 30 minutes prior to test start time. You can use the FREE valet parking offered at entrance C (encouraged to control the heart rate for the test)  Proceed to the [facility] Radiology Department (first floor) to check-in and test prep.  All radiology patients and guests should use entrance C2 at [facility], accessed from [facility], even though the hospital's physical address listed is [address].    If scheduled at [facility] or [facility], please arrive 15 mins early for check-in and test prep.  There is spacious parking and easy access to the radiology department from the [facility] entrance. Please enter here and check-in with the desk attendant.   Please follow these instructions carefully (unless otherwise directed):  An IV will be required for this test and Nitroglycerin will be given.   On the Night Before the Test: Be sure to Drink plenty of water. Do not consume any caffeinated/decaffeinated beverages or  chocolate 12 hours prior to your test. Do not take any antihistamines 12 hours prior to your test. If the patient has contrast allergy:  On the Day of the Test: Drink plenty of water until 1 hour prior to the test. Do not eat any food 1 hour prior to test. You may take your regular medications prior to the test.  Take metoprolol  (Lopressor) 100 mg two hours prior to test. (HOLD Carvedilol morning of procedure)  FEMALES- please wear underwire-free bra if available, avoid dresses & tight clothing      After the Test: Drink plenty of water. After receiving IV contrast, you may experience a mild flushed feeling. This is normal. On occasion, you may experience a mild rash up to 24 hours after the test. This is not dangerous. If this occurs, you can take Benadryl 25 mg and increase your fluid intake. If you experience trouble breathing, this can be serious. If it is severe call 911 IMMEDIATELY. If it is mild, please call our office. If you take any of these medications: Glipizide/Metformin, Avandament, Glucavance, please do not take 48 hours after completing test unless otherwise instructed.  We will call to schedule your test 2-4 weeks out understanding that some insurance companies will need an authorization prior to the service being performed.   For more information and frequently asked questions, please visit our website : [email or web address]  For non-scheduling related questions, please contact the cardiac imaging nurse navigator should you have any questions/concerns: Cardiac Imaging Nurse Navigators Direct Office Dial: [phone]   For scheduling needs, including cancellations and rescheduling, please call [place], [phone].

## 2023-09-10 NOTE — Progress Notes (Signed)
Cardiology Office Note:    Date:  09/10/2023   ID:  Sandra Strong, DOB May 22, 1956, MRN [id number]  PCP:  Emogene Morgan, MD  [facility] Cardiologist:  Debbe Odea, MD  [facility] Electrophysiologist:  None   Referring MD: Emogene Morgan, MD   Chief Complaint: ER follow-up  History of Present Illness:    Sandra Strong is a 67 y.o. female with a hx of HLD, HTN, current smoker, COPD, coronary artery calcifications who presents for ER follow-up.   CT scan 03/2023 for lung cancer screening showed LAD calcifications and aortic atherosclerosis. Echo 08/06/23 showed LVEF 55-60%, G2DD.  Patient was seen in the ER 08/27/2023 with chest pain.  Workup showed elevated BP, normal troponin, WBC 12.7. The patient was given ASA and morphine with improvement and sent home.   Today, the patient reports she was thinking the chest pain was from back pain and arthritis pain that radiated into her chest. She also describes a sharp MSK pain. She denies recurrent chest pain. No DOE, LLE, orthopnea or pnd. She thinks she is going to go back on anxiety meds. BP is mildly elevated, not on any BP meds. She said she was on hydrochlorothiazide in the past and did not tolerate it.   Past Medical History:  Diagnosis Date   Acute postoperative pain    Anemia    Anxiety    Arthritis    Asthma    Chronic BILATERAL sacroiliac joint pain    Chronic knee pain    Chronic pain    COPD (chronic obstructive pulmonary disease) (HCC)    Current every day smoker    DDD (degenerative disc disease), lumbar    Dyspnea    GERD (gastroesophageal reflux disease)    History of carpal tunnel release    Hypertension    Levoscoliosis    Lumbar facet arthropathy    Lumbar radiculitis    Lumbar stenosis with neurogenic claudication    Muscle cramps    Osteoarthritis    Osteoarthritis of facet joint of lumbar spine     Past Surgical History:  Procedure Laterality Date   ABDOMINAL HYSTERECTOMY     carpel tunnel  Bilateral    COLONOSCOPY     DENTAL SURGERY     x 2   ESOPHAGOGASTRODUODENOSCOPY     FOOT SURGERY Left    HERNIA REPAIR     INSERTION OF MESH  06/24/2023   Procedure: INSERTION OF MESH;  Surgeon: Henrene Dodge, MD;  Location: [facility] ORS;  Service: General;;   ROTATOR CUFF REPAIR Right    XI ROBOTIC ASSISTED VENTRAL HERNIA N/A 06/24/2023   Procedure: XI ROBOTIC ASSISTED VENTRAL HERNIA;  Surgeon: Henrene Dodge, MD;  Location: [facility] ORS;  Service: General;  Laterality: N/A;    Current Medications: Current Meds  Medication Sig   acetaminophen (TYLENOL) 500 MG tablet Take 500 mg by mouth every 6 (six) hours as needed. 2 tabs   albuterol (PROVENTIL HFA;VENTOLIN HFA) 108 (90 Base) MCG/ACT inhaler Inhale 2 puffs into the lungs every 4 (four) hours as needed for wheezing or shortness of breath.   albuterol (PROVENTIL) (2.5 MG/3ML) 0.083% nebulizer solution Take 2.5 mg by nebulization daily. A bedtime   alendronate (FOSAMAX) 70 MG tablet Take 70 mg by mouth once a week. Take with a full glass of water on an empty stomach.   Camphor-Menthol-Methyl Sal (SALONPAS) 3.12-05-08 % PTCH Apply topically as needed.   carvedilol (COREG) 6.25 MG tablet Take 1 tablet (6.25 mg  total) by mouth 2 (two) times daily.   cetirizine (ZYRTEC) 10 MG tablet Take 10 mg by mouth daily.   Cholecalciferol (VITAMIN D3) 2000 units TABS Take 2,000 Units by mouth daily.   clopidogrel (PLAVIX) 75 MG tablet Take 1 tablet (75 mg total) by mouth daily.   Cranberry 500 MG CAPS Take 500 mg by mouth daily.   diphenhydrAMINE (BENADRYL) 25 mg capsule Take 50 mg by mouth daily. 2 tabs every am   etodolac (LODINE) 400 MG tablet Take 400 mg by mouth 2 (two) times daily.   fluticasone (FLONASE) 50 MCG/ACT nasal spray Place 2 sprays into both nostrils in the morning and at bedtime.   fluticasone furoate-vilanterol (BREO ELLIPTA) 100-25 MCG/ACT AEPB Inhale 1 puff into the lungs daily.   metoprolol tartrate (LOPRESSOR) 100 MG tablet TAKE 1 TABLET 2  HR PRIOR TO CARDIAC PROCEDURE   montelukast (SINGULAIR) 10 MG tablet Take 10 mg by mouth at bedtime.   omeprazole (PRILOSEC) 40 MG capsule Take 40 mg by mouth daily.   psyllium (METAMUCIL) 58.6 % powder Take 1 packet by mouth daily.   rosuvastatin (CRESTOR) 10 MG tablet Take 1 tablet (10 mg total) by mouth daily.   traMADol (ULTRAM) 50 MG tablet Take 50 mg by mouth every 12 (twelve) hours as needed.   vitamin E 180 MG (400 UNITS) capsule Take 400 Units by mouth daily.   zolpidem (AMBIEN) 5 MG tablet Take 5 mg by mouth at bedtime as needed for sleep.     Allergies:   Spiriva handihaler [tiotropium bromide monohydrate], Tiotropium, Advair diskus [fluticasone-salmeterol], Erythromycin, Sulfa antibiotics, and Trazodone and nefazodone   Social History   Socioeconomic History   Marital status: Single    Spouse name: Not on file   Number of children: Not on file   Years of education: Not on file   Highest education level: Not on file  Occupational History   Not on file  Tobacco Use   Smoking status: Every Day    Current packs/day: 1.50    Average packs/day: 1.5 packs/day for 54.3 years (81.5 ttl pk-yrs)    Types: Cigarettes    Start date: 05/12/1969    Passive exposure: Past   Smokeless tobacco: Never   Tobacco comments:    Information on quiting smoking given to pt, 1.5ppd currrently.  On 07/09/23-Reports has cut back to 18 cigarettes a day  Vaping Use   Vaping status: Never Used  Substance and Sexual Activity   Alcohol use: No   Drug use: Never   Sexual activity: Not on file  Other Topics Concern   Not on file  Social History Narrative   Lives with son   Social Determinants of Health   Financial Resource Strain: Not on file  Food Insecurity: Not on file  Transportation Needs: Not on file  Physical Activity: Not on file  Stress: Not on file  Social Connections: Not on file     Family History: The patient's family history includes Breast cancer in her paternal aunt; Breast  cancer (age of onset: 36) in her mother.  ROS:   Please see the history of present illness.     All other systems reviewed and are negative.  EKGs/Labs/Other Studies Reviewed:    The following studies were reviewed today:  Echo 08/2023  1. Left ventricular ejection fraction, by estimation, is 55 to 60%. The  left ventricle has normal function. The left ventricle has no regional  wall motion abnormalities. Left ventricular diastolic parameters are  consistent with Grade II diastolic  dysfunction (pseudonormalization).   2. Right ventricular systolic function is normal. The right ventricular  size is normal. There is normal pulmonary artery systolic pressure.   3. Left atrial size was mildly dilated.   4. The mitral valve is normal in structure. No evidence of mitral valve  regurgitation.   5. The aortic valve was not well visualized. Aortic valve regurgitation  is not visualized.   6. The inferior vena cava is normal in size with <50% respiratory  variability, suggesting right atrial pressure of 8 mmHg.   EKG:  EKG is ordered today.  The ekg ordered today demonstrates NSR 83bpm, nonspecific T wave changes  Recent Labs: 08/27/2023: ALT 14; BUN 14; Creatinine, Ser 0.70; Hemoglobin 12.8; Platelets 308; Potassium 3.9; Sodium 139  Recent Lipid Panel    Component Value Date/Time   CHOL 162 07/09/2023 1205   TRIG 155 (H) 07/09/2023 1205   HDL 49 07/09/2023 1205   CHOLHDL 3.3 07/09/2023 1205   LDLCALC 86 07/09/2023 1205     Physical Exam:    VS:  BP (!) 140/102 (BP Location: Left Arm, Patient Position: Sitting, Cuff Size: Normal)   Pulse 83   Ht 5' (1.524 m)   Wt 201 lb 6.4 oz (91.4 kg)   SpO2 98%   BMI 39.33 kg/m     Wt Readings from Last 3 Encounters:  09/10/23 201 lb 6.4 oz (91.4 kg)  08/27/23 204 lb (92.5 kg)  07/16/23 204 lb 12.8 oz (92.9 kg)     GEN:  Well nourished, well developed in no acute distress HEENT: Normal NECK: No JVD; No carotid bruits LYMPHATICS: No  lymphadenopathy CARDIAC: RRR, no murmurs, rubs, gallops RESPIRATORY:  Clear to auscultation without rales, wheezing or rhonchi  ABDOMEN: Soft, non-tender, non-distended MUSCULOSKELETAL:  No edema; No deformity  SKIN: Warm and dry NEUROLOGIC:  Alert and oriented x 3 PSYCHIATRIC:  Normal affect   ASSESSMENT:    1. Coronary artery disease involving native heart without angina pectoris, unspecified vessel or lesion type   2. Precordial pain   3. Medication management   4. Essential hypertension   5. Hyperlipidemia, mixed   6. Smoking    PLAN:    In order of problems listed above:  Chest pain Coronary artery calcifications Recent ER visit for chest pain found to have elevated BP and negative troponin. Prior chest CT showed coronary artery calcifications.  She denies any further chest pain. EKG shows NSR with no ischemia. Blood pressure still mildly elevated.  Recent echo showed EF 55 to 60%, grade 2 diastolic dysfunction.  I will start patient on Coreg and order a cardiac CTA.  Patient reports she will be out of town for couple months and we can schedule the study for January 2025.  Continue Plavix 75 mg daily and crestor.  HTN Blood pressure mildly elevated today.  I will start Coreg 6.25 mg twice daily.  Patient will monitor blood pressure at home until we can see her back in the office.  HLD LDL 86. Continue Crestor 10mg  daily. Can increase at follow-up.   Tobacco use She cut back on smoking, complete cessation recommended.   Disposition: Follow up in 3 months with MD/APP    Signed, Nyla Creason David Stall, PA-C  09/10/2023 12:16 PM    [facility]

## 2023-09-13 ENCOUNTER — Encounter (HOSPITAL_COMMUNITY): Payer: Self-pay

## 2023-10-08 ENCOUNTER — Telehealth: Payer: Self-pay | Admitting: Cardiology

## 2023-10-08 MED ORDER — CLOPIDOGREL BISULFATE 75 MG PO TABS: 75.0000 mg | ORAL_TABLET | Freq: Every day | ORAL | 0 refills | Status: DC

## 2023-10-08 NOTE — Telephone Encounter (Signed)
Message left informing patient that a 30 day supply sent to requested pharmacy.

## 2023-10-08 NOTE — Telephone Encounter (Signed)
last visit 09/10/23 with plan to f/u in 3 months.    next visit:  12/23/23

## 2023-10-08 NOTE — Telephone Encounter (Signed)
*  STAT* If patient is at the pharmacy, call can be transferred to refill team.   1. Which medications need to be refilled? (please list name of each medication and dose if known)  clopidogrel (PLAVIX) 75 MG tablet  2. Which pharmacy/location (including street and city if local pharmacy) is medication to be sent to? [facility] [address], [place], [place] [address]  Phone: [phone] Fax: [phone]  3. Do they need a 30 day or 90 day supply?   90 day supply  Patient states she is in [place] visiting her sister and last night she took her last tablet. She is completely out of medication and unable to get prescription from [employer].

## 2023-10-22 ENCOUNTER — Telehealth: Payer: Self-pay | Admitting: Medical

## 2023-10-22 NOTE — Telephone Encounter (Signed)
Pt c/o medication issue:  1. Name of Medication:   carvedilol (COREG) 6.25 MG tablet    2. How are you currently taking this medication (dosage and times per day)?   Take 1 tablet (6.25 mg total) by mouth 2 (two) times daily.    3. Are you having a reaction (difficulty breathing--STAT)? No  4. What is your medication issue? Pt states the medication from above is not working for her. Please advise

## 2023-11-01 MED ORDER — CARVEDILOL 12.5 MG PO TABS: 12.5000 mg | ORAL_TABLET | Freq: Two times a day (BID) | ORAL | 3 refills | Status: DC

## 2023-11-01 NOTE — Telephone Encounter (Signed)
Left message on patient's voicemail stating the following: "Furth, Cadence Furth, PA-C We can go up to Coreg 12.5mg  twice daily."

## 2023-11-25 ENCOUNTER — Other Ambulatory Visit: Payer: Self-pay

## 2023-11-25 MED ORDER — ROSUVASTATIN CALCIUM 10 MG PO TABS: 10.0000 mg | ORAL_TABLET | Freq: Every day | ORAL | 2 refills | Status: DC

## 2023-12-09 ENCOUNTER — Other Ambulatory Visit: Payer: Medicare HMO

## 2023-12-10 LAB — BASIC METABOLIC PANEL
BUN/Creatinine Ratio: 18 (ref 12–28)
BUN: 15 mg/dL (ref 8–27)
CO2: 22 mmol/L (ref 20–29)
Calcium: 9.9 mg/dL (ref 8.7–10.3)
Chloride: 104 mmol/L (ref 96–106)
Creatinine, Ser: 0.84 mg/dL (ref 0.57–1.00)
Glucose: 80 mg/dL (ref 70–99)
Potassium: 4.6 mmol/L (ref 3.5–5.2)
Sodium: 140 mmol/L (ref 134–144)
eGFR: 77 mL/min/{1.73_m2} (ref >59)

## 2023-12-13 ENCOUNTER — Ambulatory Visit: Payer: Medicare HMO | Admitting: Cardiology

## 2023-12-15 ENCOUNTER — Telehealth (HOSPITAL_COMMUNITY): Payer: Self-pay | Admitting: *Deleted

## 2023-12-15 NOTE — Telephone Encounter (Signed)
 Reaching out to patient to offer assistance regarding upcoming cardiac imaging study; pt verbalizes understanding of appt date/time, parking situation and where to check in, pre-test NPO status and medications ordered, and verified current allergies; name and call back number provided for further questions should they arise Johney Frame RN Navigator Cardiac Imaging [facility] [phone] office [phone] cell

## 2023-12-16 ENCOUNTER — Other Ambulatory Visit: Payer: Self-pay | Admitting: Medical

## 2023-12-16 ENCOUNTER — Ambulatory Visit
Admission: RE | Admit: 2023-12-16 | Discharge: 2023-12-16 | Disposition: A | Discharge status: Home / Self Care | Payer: Medicare HMO | Source: Ambulatory Visit | Attending: Medical | Admitting: Medical

## 2023-12-16 DIAGNOSIS — R931 Abnormal findings on diagnostic imaging of heart and coronary circulation: Secondary | ICD-10-CM | POA: Insufficient documentation

## 2023-12-16 DIAGNOSIS — R072 Precordial pain: Secondary | ICD-10-CM | POA: Insufficient documentation

## 2023-12-16 MED ORDER — IOHEXOL 350 MG/ML SOLN
100.0000 mL | Freq: Once | INTRAVENOUS | Status: AC | PRN
  Administered 2023-12-16: 100 mL via INTRAVENOUS

## 2023-12-16 MED ORDER — NITROGLYCERIN 0.4 MG SL SUBL
0.8000 mg | SUBLINGUAL_TABLET | Freq: Once | SUBLINGUAL | Status: AC
  Administered 2023-12-16: 0.8 mg via SUBLINGUAL

## 2023-12-16 MED ORDER — SODIUM CHLORIDE 0.9 % IV SOLN: INTRAVENOUS | Status: DC

## 2023-12-16 MED ORDER — DILTIAZEM HCL 25 MG/5ML IV SOLN: 10.0000 mg | INTRAVENOUS | Status: DC | PRN

## 2023-12-16 MED ORDER — METOPROLOL TARTRATE 5 MG/5ML IV SOLN: 10.0000 mg | Freq: Once | INTRAVENOUS | Status: DC | PRN

## 2023-12-16 NOTE — Progress Notes (Signed)
Patient tolerated procedure well. Ambulate w/o difficulty. Denies light headedness or being dizzy. Sitting up drinking water provided. Encouraged to drink extra water today and reasoning explained. Verbalized understanding. All questions answered. ABC intact. No further needs. Discharge from procedure area w/o issues.

## 2023-12-23 ENCOUNTER — Encounter: Payer: Self-pay | Admitting: Cardiology

## 2023-12-23 ENCOUNTER — Ambulatory Visit: Payer: Medicare HMO | Attending: Cardiology | Admitting: Cardiology

## 2023-12-23 VITALS — BP 150/98 | HR 71 | Ht 62.0 in | Wt 198.6 lb

## 2023-12-23 DIAGNOSIS — I2510 Atherosclerotic heart disease of native coronary artery without angina pectoris: Secondary | ICD-10-CM

## 2023-12-23 DIAGNOSIS — E782 Mixed hyperlipidemia: Secondary | ICD-10-CM

## 2023-12-23 DIAGNOSIS — F17200 Nicotine dependence, unspecified, uncomplicated: Secondary | ICD-10-CM

## 2023-12-23 MED ORDER — LOSARTAN POTASSIUM 50 MG PO TABS: 50.0000 mg | ORAL_TABLET | Freq: Every day | ORAL | 3 refills | Status: DC

## 2023-12-23 NOTE — Patient Instructions (Signed)
Medication Instructions:  STOP Carvedilol  START Losartan 50 mg daily   *If you need a refill on your cardiac medications before your next appointment, please call your pharmacy*   Follow-Up: At [facility], you and your health needs are our priority.  As part of our continuing mission to provide you with exceptional heart care, we have created designated Provider Care Teams.  These Care Teams include your primary Cardiologist (physician) and Advanced Practice Providers (APPs -  Physician Assistants and Nurse Practitioners) who all work together to provide you with the care you need, when you need it.  We recommend signing up for the patient portal called "MyChart".  Sign up information is provided on this After Visit Summary.  MyChart is used to connect with patients for Virtual Visits (Telemedicine).  Patients are able to view lab/test results, encounter notes, upcoming appointments, etc.  Non-urgent messages can be sent to your provider as well.   To learn more about what you can do with MyChart, go to [email or web address].    Your next appointment:   3 month(s)  Provider:   You may see Debbe Odea, MD or one of the following Advanced Practice Providers on your designated Care Team:   Nicolasa Ducking, NP Eula Listen, PA-C Cadence Fransico Michael, PA-C Charlsie Quest, NP Carlos Levering, NP

## 2023-12-23 NOTE — Progress Notes (Signed)
Cardiology Office Note:    Date:  12/23/2023   ID:  Sandra Strong, DOB [birth year], MRN [id number]  PCP:  Emogene Morgan, MD   [facility] Providers Cardiologist:  Debbe Odea, MD     Referring MD: Emogene Morgan, MD   Chief Complaint  Patient presents with   Follow-up    Not taking Carvedilol 12.5 due to high risk with COPD.  But still taking Carvedilol 6.25 mg BID and is concerned with this could be causing her increasing "breathing issues".      History of Present Illness:    Sandra Strong is a 68 y.o. female with a hx of hypertension, hyperlipidemia, current smoker x 50+ years, COPD presenting for follow-up.   Being seen for elevated BP and coronary calcifications.  Denies chest pain.  Started on Coreg after last visit due to elevated BP, she is worried due to reports of Coreg causing breathing issues.  Has a pinched nerve in her back, is in chronic pain, worse over the past 2 weeks.  Prior notes/testing Echo 9/24 EF 55 to 60%. Chest CT 03/2023 lung cancer screening showing LAD calcifications and aortic atherosclerosis.   Past Medical History:  Diagnosis Date   Acute postoperative pain    Anemia    Anxiety    Arthritis    Asthma    Chronic BILATERAL sacroiliac joint pain    Chronic knee pain    Chronic pain    COPD (chronic obstructive pulmonary disease) (HCC)    Current every day smoker    DDD (degenerative disc disease), lumbar    Dyspnea    GERD (gastroesophageal reflux disease)    History of carpal tunnel release    Hypertension    Levoscoliosis    Lumbar facet arthropathy    Lumbar radiculitis    Lumbar stenosis with neurogenic claudication    Muscle cramps    Osteoarthritis    Osteoarthritis of facet joint of lumbar spine     Past Surgical History:  Procedure Laterality Date   ABDOMINAL HYSTERECTOMY     carpel tunnel Bilateral    COLONOSCOPY     DENTAL SURGERY     x 2   ESOPHAGOGASTRODUODENOSCOPY     FOOT SURGERY Left    HERNIA  REPAIR     INSERTION OF MESH  06/24/2023   Procedure: INSERTION OF MESH;  Surgeon: Henrene Dodge, MD;  Location: [facility] ORS;  Service: General;;   ROTATOR CUFF REPAIR Right    XI ROBOTIC ASSISTED VENTRAL HERNIA N/A 06/24/2023   Procedure: XI ROBOTIC ASSISTED VENTRAL HERNIA;  Surgeon: Henrene Dodge, MD;  Location: [facility] ORS;  Service: General;  Laterality: N/A;    Current Medications: Current Meds  Medication Sig   acetaminophen (TYLENOL) 500 MG tablet Take 500 mg by mouth every 6 (six) hours as needed. 2 tabs   albuterol (PROVENTIL HFA;VENTOLIN HFA) 108 (90 Base) MCG/ACT inhaler Inhale 2 puffs into the lungs every 4 (four) hours as needed for wheezing or shortness of breath.   albuterol (PROVENTIL) (2.5 MG/3ML) 0.083% nebulizer solution Take 2.5 mg by nebulization daily. A bedtime   alendronate (FOSAMAX) 70 MG tablet Take 70 mg by mouth once a week. Take with a full glass of water on an empty stomach.   Camphor-Menthol-Methyl Sal (SALONPAS) 3.12-05-08 % PTCH Apply topically as needed.   cetirizine (ZYRTEC) 10 MG tablet Take 10 mg by mouth daily.   Cholecalciferol (VITAMIN D3) 2000 units TABS Take 2,000 Units by mouth daily.  clopidogrel (PLAVIX) 75 MG tablet Take 1 tablet (75 mg total) by mouth daily.   Cranberry 500 MG CAPS Take 500 mg by mouth daily.   diphenhydrAMINE (BENADRYL) 25 mg capsule Take 50 mg by mouth daily. 2 tabs every am   etodolac (LODINE) 400 MG tablet Take 400 mg by mouth 2 (two) times daily.   fluticasone (FLONASE) 50 MCG/ACT nasal spray Place 2 sprays into both nostrils in the morning and at bedtime.   fluticasone furoate-vilanterol (BREO ELLIPTA) 100-25 MCG/ACT AEPB Inhale 1 puff into the lungs daily.   losartan (COZAAR) 50 MG tablet Take 1 tablet (50 mg total) by mouth daily.   montelukast (SINGULAIR) 10 MG tablet Take 10 mg by mouth at bedtime.   omeprazole (PRILOSEC) 40 MG capsule Take 40 mg by mouth daily.   psyllium (METAMUCIL) 58.6 % powder Take 1 packet by mouth  daily.   rosuvastatin (CRESTOR) 10 MG tablet Take 1 tablet (10 mg total) by mouth daily.   sertraline (ZOLOFT) 100 MG tablet Take 100 mg by mouth daily.   traMADol (ULTRAM) 50 MG tablet Take 50 mg by mouth every 12 (twelve) hours as needed.   vitamin E 180 MG (400 UNITS) capsule Take 400 Units by mouth daily.   zolpidem (AMBIEN) 5 MG tablet Take 5 mg by mouth at bedtime as needed for sleep.   [DISCONTINUED] carvedilol (COREG) 6.25 MG tablet Take 6.25 mg by mouth 2 (two) times daily.     Allergies:   Spiriva handihaler [tiotropium bromide monohydrate], Tiotropium, Advair diskus [fluticasone-salmeterol], Erythromycin, Sulfa antibiotics, and Trazodone and nefazodone   Social History   Socioeconomic History   Marital status: Single    Spouse name: Not on file   Number of children: Not on file   Years of education: Not on file   Highest education level: Not on file  Occupational History   Not on file  Tobacco Use   Smoking status: Every Day    Current packs/day: 1.50    Average packs/day: 1.5 packs/day for 54.6 years (81.9 ttl pk-yrs)    Types: Cigarettes    Start date: 05/12/1969    Passive exposure: Past   Smokeless tobacco: Never   Tobacco comments:    Information on quiting smoking given to pt, 1.5ppd currrently.  On 07/09/23-Reports has cut back to 18 cigarettes a day  Vaping Use   Vaping status: Never Used  Substance and Sexual Activity   Alcohol use: No   Drug use: Never   Sexual activity: Not on file  Other Topics Concern   Not on file  Social History Narrative   Lives with son   Social Drivers of Health   Financial Resource Strain: Not on file  Food Insecurity: Not on file  Transportation Needs: Not on file  Physical Activity: Not on file  Stress: Not on file  Social Connections: Not on file     Family History: The patient's family history includes Breast cancer in her paternal aunt; Breast cancer (age of onset: 72) in her mother.  ROS:   Please see the  history of present illness.     All other systems reviewed and are negative.  EKGs/Labs/Other Studies Reviewed:    The following studies were reviewed today:       Recent Labs: 08/27/2023: ALT 14; Hemoglobin 12.8; Platelets 308 12/09/2023: BUN 15; Creatinine, Ser 0.84; Potassium 4.6; Sodium 140  Recent Lipid Panel    Component Value Date/Time   CHOL 162 07/09/2023 1205   TRIG  155 (H) 07/09/2023 1205   HDL 49 07/09/2023 1205   CHOLHDL 3.3 07/09/2023 1205   LDLCALC 86 07/09/2023 1205     Risk Assessment/Calculations:         Physical Exam:    VS:  BP (!) 150/98 (BP Location: Left Arm, Patient Position: Sitting, Cuff Size: Large)   Pulse 71   Ht 5\' 2"  (1.575 m)   Wt 198 lb 9.6 oz (90.1 kg)   SpO2 98%   BMI 36.32 kg/m     Wt Readings from Last 3 Encounters:  12/23/23 198 lb 9.6 oz (90.1 kg)  09/10/23 201 lb 6.4 oz (91.4 kg)  08/27/23 204 lb (92.5 kg)     GEN:  Well nourished, well developed in no acute distress HEENT: Normal NECK: No JVD; No carotid bruits CARDIAC: RRR, no murmurs, rubs, gallops RESPIRATORY: Diminished breath sounds, no wheezing. ABDOMEN: Soft, non-tender, non-distended MUSCULOSKELETAL:  No edema; No deformity  SKIN: Warm and dry NEUROLOGIC:  Alert and oriented x 3 PSYCHIATRIC:  Normal affect   ASSESSMENT:    1. Coronary artery disease involving native heart without angina pectoris, unspecified vessel or lesion type   2. Smoking   3. Hyperlipidemia, mixed     PLAN:    In order of problems listed above:  CAD/LAD calcifications noted on chest CT.  Patient denies chest pain.  Echo 9/24 EF 55 to 60%.  Continue Plavix 75 mg,  Crestor 10 mg daily.    Patient on chronic nonsteroidals for arthritis. Current smoker, smoking cessation advised. Hyperlipidemia, LDL goal less than 70.  Crestor increased to 10 mg daily.  Low-cholesterol diet emphasized.  Hypertension, BP elevated.  Shortness of breath with Coreg.  Stop Coreg, start losartan 50 mg  daily.  Follow-up in 3 months.     Medication Adjustments/Labs and Tests Ordered: Current medicines are reviewed at length with the patient today.  Concerns regarding medicines are outlined above.  No orders of the defined types were placed in this encounter.  Meds ordered this encounter  Medications   losartan (COZAAR) 50 MG tablet    Sig: Take 1 tablet (50 mg total) by mouth daily.    Dispense:  90 tablet    Refill:  3    Patient Instructions  Medication Instructions:  STOP Carvedilol  START Losartan 50 mg daily   *If you need a refill on your cardiac medications before your next appointment, please call your pharmacy*   Follow-Up: At [facility], you and your health needs are our priority.  As part of our continuing mission to provide you with exceptional heart care, we have created designated Provider Care Teams.  These Care Teams include your primary Cardiologist (physician) and Advanced Practice Providers (APPs -  Physician Assistants and Nurse Practitioners) who all work together to provide you with the care you need, when you need it.  We recommend signing up for the patient portal called "MyChart".  Sign up information is provided on this After Visit Summary.  MyChart is used to connect with patients for Virtual Visits (Telemedicine).  Patients are able to view lab/test results, encounter notes, upcoming appointments, etc.  Non-urgent messages can be sent to your provider as well.   To learn more about what you can do with MyChart, go to [email or web address].    Your next appointment:   3 month(s)  Provider:   You may see Debbe Odea, MD or one of the following Advanced Practice Providers on your designated Care Team:  Nicolasa Ducking, NP Eula Listen, PA-C Cadence Fransico Michael, PA-C Charlsie Quest, NP Carlos Levering, NP           Signed, Debbe Odea, MD  12/23/2023 10:48 AM    [facility]

## 2024-02-10 ENCOUNTER — Encounter: Payer: Self-pay | Admitting: Cardiovascular Disease

## 2024-02-10 ENCOUNTER — Ambulatory Visit (INDEPENDENT_AMBULATORY_CARE_PROVIDER_SITE_OTHER): Payer: Medicare HMO | Admitting: Cardiovascular Disease

## 2024-02-10 VITALS — BP 133/75 | HR 104 | Ht 60.0 in | Wt 192.0 lb

## 2024-02-10 DIAGNOSIS — R0602 Shortness of breath: Secondary | ICD-10-CM | POA: Diagnosis not present

## 2024-02-10 DIAGNOSIS — E782 Mixed hyperlipidemia: Secondary | ICD-10-CM | POA: Diagnosis not present

## 2024-02-10 DIAGNOSIS — I2585 Chronic coronary microvascular dysfunction: Secondary | ICD-10-CM | POA: Diagnosis not present

## 2024-02-10 DIAGNOSIS — F17218 Nicotine dependence, cigarettes, with other nicotine-induced disorders: Secondary | ICD-10-CM | POA: Diagnosis not present

## 2024-02-10 DIAGNOSIS — I5033 Acute on chronic diastolic (congestive) heart failure: Secondary | ICD-10-CM | POA: Diagnosis not present

## 2024-02-10 MED ORDER — CLOPIDOGREL BISULFATE 75 MG PO TABS: 75.0000 mg | ORAL_TABLET | Freq: Every day | ORAL | 0 refills | Status: DC

## 2024-02-10 MED ORDER — PANTOPRAZOLE SODIUM 40 MG PO TBEC
40.0000 mg | DELAYED_RELEASE_TABLET | Freq: Every day | ORAL | 1 refills | Status: DC
Start: 2024-02-10 — End: 2024-06-22

## 2024-02-10 MED ORDER — SPIRONOLACTONE 25 MG PO TABS
12.5000 mg | ORAL_TABLET | Freq: Every day | ORAL | 11 refills | Status: AC
Start: 2024-02-10 — End: 2025-02-09

## 2024-02-10 MED ORDER — DAPAGLIFLOZIN PROPANEDIOL 10 MG PO TABS: 10.0000 mg | ORAL_TABLET | Freq: Every day | ORAL | 3 refills | Status: DC

## 2024-02-10 NOTE — Progress Notes (Signed)
 Cardiology Office Note   Date:  02/10/2024   ID:  Sandra, Strong Feb 02, 1956, MRN [id number]  PCP:  Emogene Morgan, MD  Cardiologist:  Adrian Blackwater, MD      History of Present Illness: Sandra Strong is a 68 y.o. female who presents for No chief complaint on file.   This is a 68 year old white female with a past medical history of COPD and smoking presents to me for evaluation because of shortness of breath on minimal exertion and even at rest.  She has no chest pain. Had echo and CTA.  She had FFR but I do not see any calcium score although's CT chest revealed calcification of the coronaries without mentioning any calcium score.  She had a echocardiogram which showed grade 1 diastolic dysfunction with normal ejection fraction.      Past Medical History:  Diagnosis Date   Acute postoperative pain    Anemia    Anxiety    Arthritis    Asthma    Chronic BILATERAL sacroiliac joint pain    Chronic knee pain    Chronic pain    COPD (chronic obstructive pulmonary disease) (HCC)    Current every day smoker    DDD (degenerative disc disease), lumbar    Dyspnea    GERD (gastroesophageal reflux disease)    History of carpal tunnel release    Hypertension    Levoscoliosis    Lumbar facet arthropathy    Lumbar radiculitis    Lumbar stenosis with neurogenic claudication    Muscle cramps    Osteoarthritis    Osteoarthritis of facet joint of lumbar spine      Past Surgical History:  Procedure Laterality Date   ABDOMINAL HYSTERECTOMY     carpel tunnel Bilateral    COLONOSCOPY     DENTAL SURGERY     x 2   ESOPHAGOGASTRODUODENOSCOPY     FOOT SURGERY Left    HERNIA REPAIR     INSERTION OF MESH  06/24/2023   Procedure: INSERTION OF MESH;  Surgeon: Henrene Dodge, MD;  Location: [facility];  Service: General;;   ROTATOR CUFF REPAIR Right    XI ROBOTIC ASSISTED VENTRAL HERNIA N/A 06/24/2023   Procedure: XI ROBOTIC ASSISTED VENTRAL HERNIA;  Surgeon: Henrene Dodge, MD;   Location: [facility];  Service: General;  Laterality: N/A;     Current Outpatient Medications  Medication Sig Dispense Refill   dapagliflozin propanediol (FARXIGA) 10 MG TABS tablet Take 1 tablet (10 mg total) by mouth daily before breakfast. 30 tablet 3   losartan (COZAAR) 50 MG tablet Take 1 tablet (50 mg total) by mouth daily. 90 tablet 3   pantoprazole (PROTONIX) 40 MG tablet Take 1 tablet (40 mg total) by mouth daily. 30 tablet 1   spironolactone (ALDACTONE) 25 MG tablet Take 0.5 tablets (12.5 mg total) by mouth daily. 30 tablet 11   acetaminophen (TYLENOL) 500 MG tablet Take 500 mg by mouth every 6 (six) hours as needed. 2 tabs     albuterol (PROVENTIL HFA;VENTOLIN HFA) 108 (90 Base) MCG/ACT inhaler Inhale 2 puffs into the lungs every 4 (four) hours as needed for wheezing or shortness of breath.     albuterol (PROVENTIL) (2.5 MG/3ML) 0.083% nebulizer solution Take 2.5 mg by nebulization daily. A bedtime     alendronate (FOSAMAX) 70 MG tablet Take 70 mg by mouth once a week. Take with a full glass of water on an empty stomach.     Camphor-Menthol-Methyl Sal (  SALONPAS) 3.12-05-08 % PTCH Apply topically as needed.     cetirizine (ZYRTEC) 10 MG tablet Take 10 mg by mouth daily.     Cholecalciferol (VITAMIN D3) 2000 units TABS Take 2,000 Units by mouth daily.     clopidogrel (PLAVIX) 75 MG tablet Take 1 tablet (75 mg total) by mouth daily. 30 tablet 0   Cranberry 500 MG CAPS Take 500 mg by mouth daily.     diphenhydrAMINE (BENADRYL) 25 mg capsule Take 50 mg by mouth daily. 2 tabs every am     etodolac (LODINE) 400 MG tablet Take 400 mg by mouth 2 (two) times daily.     fluticasone (FLONASE) 50 MCG/ACT nasal spray Place 2 sprays into both nostrils in the morning and at bedtime.     fluticasone furoate-vilanterol (BREO ELLIPTA) 100-25 MCG/ACT AEPB Inhale 1 puff into the lungs daily.     montelukast (SINGULAIR) 10 MG tablet Take 10 mg by mouth at bedtime.     psyllium (METAMUCIL) 58.6 % powder Take  1 packet by mouth daily.     sertraline (ZOLOFT) 100 MG tablet Take 100 mg by mouth daily.     traMADol (ULTRAM) 50 MG tablet Take 50 mg by mouth every 12 (twelve) hours as needed.     vitamin E 180 MG (400 UNITS) capsule Take 400 Units by mouth daily.     zolpidem (AMBIEN) 5 MG tablet Take 5 mg by mouth at bedtime as needed for sleep.     No current facility-administered medications for this visit.    Allergies:   Spiriva handihaler [tiotropium bromide monohydrate], Tiotropium, Advair diskus [fluticasone-salmeterol], Erythromycin, Sulfa antibiotics, and Trazodone and nefazodone    Social History:   reports that she has been smoking cigarettes. She started smoking about 54 years ago. She has a 82.1 pack-year smoking history. She has been exposed to tobacco smoke. She has never used smokeless tobacco. She reports that she does not drink alcohol and does not use drugs.   Family History:  family history includes Breast cancer in her paternal aunt; Breast cancer (age of onset: 41) in her mother.    ROS:     Review of Systems  Constitutional: Negative.   HENT: Negative.    Eyes: Negative.   Respiratory: Negative.    Gastrointestinal: Negative.   Genitourinary: Negative.   Musculoskeletal: Negative.   Skin: Negative.   Neurological: Negative.   Endo/Heme/Allergies: Negative.   Psychiatric/Behavioral: Negative.    All other systems reviewed and are negative.     All other systems are reviewed and negative.    PHYSICAL EXAM: VS:  BP 133/75   Pulse (!) 104   Ht 5' (1.524 m)   Wt 192 lb (87.1 kg)   SpO2 98%   BMI 37.50 kg/m  , BMI Body mass index is 37.5 kg/m. Last weight:  Wt Readings from Last 3 Encounters:  02/10/24 192 lb (87.1 kg)  12/23/23 198 lb 9.6 oz (90.1 kg)  09/10/23 201 lb 6.4 oz (91.4 kg)     Physical Exam Constitutional:      Appearance: Normal appearance.  Cardiovascular:     Rate and Rhythm: Normal rate and regular rhythm.     Heart sounds: Normal  heart sounds.  Pulmonary:     Effort: Pulmonary effort is normal.     Breath sounds: Normal breath sounds.  Musculoskeletal:     Right lower leg: No edema.     Left lower leg: No edema.  Neurological:  Mental Status: She is alert.       EKG: nsr 93/min low voltage lae  Recent Labs: 08/27/2023: ALT 14; Hemoglobin 12.8; Platelets 308 12/09/2023: BUN 15; Creatinine, Ser 0.84; Potassium 4.6; Sodium 140    Lipid Panel    Component Value Date/Time   CHOL 162 07/09/2023 1205   TRIG 155 (H) 07/09/2023 1205   HDL 49 07/09/2023 1205   CHOLHDL 3.3 07/09/2023 1205   LDLCALC 86 07/09/2023 1205      Other studies Reviewed: Additional studies/ records that were reviewed today include:  Review of the above records demonstrates:      07/09/2023   11:33 AM  PAD Screen  Previous PAD dx? No  Previous surgical procedure? No  Pain with walking? No  Feet/toe relief with dangling? No  Painful, non-healing ulcers? No  Extremities discolored? No      ASSESSMENT AND PLAN:    ICD-10-CM   1. SOB (shortness of breath)  R06.02 pantoprazole (PROTONIX) 40 MG tablet    PCV ECHOCARDIOGRAM COMPLETE    MYOCARDIAL PERFUSION IMAGING    dapagliflozin propanediol (FARXIGA) 10 MG TABS tablet    spironolactone (ALDACTONE) 25 MG tablet   Patient still has shortness of breath thus will do our own testing echo and a stress test.    2. CHF (congestive heart failure), NYHA class III, acute on chronic, diastolic (HCC)  I50.33 pantoprazole (PROTONIX) 40 MG tablet    PCV ECHOCARDIOGRAM COMPLETE    MYOCARDIAL PERFUSION IMAGING    dapagliflozin propanediol (FARXIGA) 10 MG TABS tablet    spironolactone (ALDACTONE) 25 MG tablet   Patient has grade 1 diastolic dysfunction with severe shortness of breath thus will start the patient on Aldactone 12.5 mg once a day and Farxiga 10 mg daily.    3. Chronic coronary microvascular dysfunction  I25.85 pantoprazole (PROTONIX) 40 MG tablet    PCV ECHOCARDIOGRAM  COMPLETE    MYOCARDIAL PERFUSION IMAGING    dapagliflozin propanediol (FARXIGA) 10 MG TABS tablet    spironolactone (ALDACTONE) 25 MG tablet   Patient has nonobstructive CAD and LDL is 72 and goal is to achieve 55.  Thus will increase Crestor to 20 mg p.o. once a day.    4. Mixed hyperlipidemia  E78.2 pantoprazole (PROTONIX) 40 MG tablet    PCV ECHOCARDIOGRAM COMPLETE    MYOCARDIAL PERFUSION IMAGING    dapagliflozin propanediol (FARXIGA) 10 MG TABS tablet    spironolactone (ALDACTONE) 25 MG tablet   LDL 72, tig 130, total chol 149.  Will increase Crestor to 20 as has Coronary calcification without any obstructive CAD.       Problem List Items Addressed This Visit   None Visit Diagnoses       SOB (shortness of breath)    -  Primary   Patient still has shortness of breath thus will do our own testing echo and a stress test.   Relevant Medications   pantoprazole (PROTONIX) 40 MG tablet   dapagliflozin propanediol (FARXIGA) 10 MG TABS tablet   spironolactone (ALDACTONE) 25 MG tablet   Other Relevant Orders   PCV ECHOCARDIOGRAM COMPLETE   MYOCARDIAL PERFUSION IMAGING     CHF (congestive heart failure), NYHA class III, acute on chronic, diastolic (HCC)       Patient has grade 1 diastolic dysfunction with severe shortness of breath thus will start the patient on Aldactone 12.5 mg once a day and Farxiga 10 mg daily.   Relevant Medications   pantoprazole (PROTONIX) 40 MG tablet  dapagliflozin propanediol (FARXIGA) 10 MG TABS tablet   spironolactone (ALDACTONE) 25 MG tablet   Other Relevant Orders   PCV ECHOCARDIOGRAM COMPLETE   MYOCARDIAL PERFUSION IMAGING     Chronic coronary microvascular dysfunction       Patient has nonobstructive CAD and LDL is 72 and goal is to achieve 55.  Thus will increase Crestor to 20 mg p.o. once a day.   Relevant Medications   pantoprazole (PROTONIX) 40 MG tablet   dapagliflozin propanediol (FARXIGA) 10 MG TABS tablet   spironolactone (ALDACTONE) 25 MG  tablet   Other Relevant Orders   PCV ECHOCARDIOGRAM COMPLETE   MYOCARDIAL PERFUSION IMAGING     Mixed hyperlipidemia       LDL 72, tig 130, total chol 149.  Will increase Crestor to 20 as has Coronary calcification without any obstructive CAD.   Relevant Medications   pantoprazole (PROTONIX) 40 MG tablet   dapagliflozin propanediol (FARXIGA) 10 MG TABS tablet   spironolactone (ALDACTONE) 25 MG tablet   Other Relevant Orders   PCV ECHOCARDIOGRAM COMPLETE   MYOCARDIAL PERFUSION IMAGING          Disposition:   Return in about 4 weeks (around 03/09/2024) for echo, and stress test aand f/u.    Total time spent: 50 minutes  Signed,  Adrian Blackwater, MD  02/10/2024 11:46 AM    [facility]

## 2024-02-14 ENCOUNTER — Telehealth: Payer: Self-pay

## 2024-02-14 ENCOUNTER — Other Ambulatory Visit: Payer: Self-pay

## 2024-02-14 NOTE — Telephone Encounter (Signed)
 Spoke with patient, she needed rx for rosuvastatin called into the pharmacy for the patient

## 2024-02-14 NOTE — Telephone Encounter (Signed)
 Pt LM but did not state what she needed only left her number

## 2024-02-15 MED ORDER — ROSUVASTATIN CALCIUM 20 MG PO TABS: 20.0000 mg | ORAL_TABLET | Freq: Every day | ORAL | 1 refills | Status: DC

## 2024-03-02 ENCOUNTER — Ambulatory Visit (INDEPENDENT_AMBULATORY_CARE_PROVIDER_SITE_OTHER)

## 2024-03-02 DIAGNOSIS — I2585 Chronic coronary microvascular dysfunction: Secondary | ICD-10-CM | POA: Diagnosis not present

## 2024-03-02 DIAGNOSIS — E782 Mixed hyperlipidemia: Secondary | ICD-10-CM

## 2024-03-02 DIAGNOSIS — R0602 Shortness of breath: Secondary | ICD-10-CM

## 2024-03-02 DIAGNOSIS — I5033 Acute on chronic diastolic (congestive) heart failure: Secondary | ICD-10-CM | POA: Diagnosis not present

## 2024-03-02 MED ORDER — TECHNETIUM TC 99M SESTAMIBI GENERIC - CARDIOLITE
11.0000 | Freq: Once | INTRAVENOUS | Status: AC | PRN
  Administered 2024-03-02: 11 via INTRAVENOUS

## 2024-03-02 MED ORDER — TECHNETIUM TC 99M SESTAMIBI GENERIC - CARDIOLITE
31.2000 | Freq: Once | INTRAVENOUS | Status: AC | PRN
  Administered 2024-03-02: 31.2 via INTRAVENOUS

## 2024-03-03 ENCOUNTER — Ambulatory Visit

## 2024-03-03 DIAGNOSIS — R0602 Shortness of breath: Secondary | ICD-10-CM

## 2024-03-03 DIAGNOSIS — I2585 Chronic coronary microvascular dysfunction: Secondary | ICD-10-CM

## 2024-03-03 DIAGNOSIS — I361 Nonrheumatic tricuspid (valve) insufficiency: Secondary | ICD-10-CM

## 2024-03-03 DIAGNOSIS — I5033 Acute on chronic diastolic (congestive) heart failure: Secondary | ICD-10-CM

## 2024-03-03 DIAGNOSIS — E782 Mixed hyperlipidemia: Secondary | ICD-10-CM

## 2024-03-07 ENCOUNTER — Encounter: Payer: Self-pay | Admitting: Cardiovascular Disease

## 2024-03-07 ENCOUNTER — Ambulatory Visit (INDEPENDENT_AMBULATORY_CARE_PROVIDER_SITE_OTHER): Admitting: Cardiovascular Disease

## 2024-03-07 VITALS — BP 110/72 | Ht 60.0 in | Wt 193.0 lb

## 2024-03-07 DIAGNOSIS — E782 Mixed hyperlipidemia: Secondary | ICD-10-CM

## 2024-03-07 DIAGNOSIS — I2585 Chronic coronary microvascular dysfunction: Secondary | ICD-10-CM

## 2024-03-07 DIAGNOSIS — I5033 Acute on chronic diastolic (congestive) heart failure: Secondary | ICD-10-CM | POA: Diagnosis not present

## 2024-03-07 DIAGNOSIS — F17209 Nicotine dependence, unspecified, with unspecified nicotine-induced disorders: Secondary | ICD-10-CM

## 2024-03-07 DIAGNOSIS — R0602 Shortness of breath: Secondary | ICD-10-CM

## 2024-03-07 DIAGNOSIS — Z0130 Encounter for examination of blood pressure without abnormal findings: Secondary | ICD-10-CM

## 2024-03-07 NOTE — Progress Notes (Signed)
 Cardiology Office Note   Date:  03/07/2024   ID:  Ortha, Metts Oct 05, 1956, MRN [id number]  PCP:  Emogene Morgan, MD  Cardiologist:  Adrian Blackwater, MD      History of Present Illness: Sandra Strong is a 68 y.o. female who presents for  Chief Complaint  Patient presents with   Follow-up    ECHO/NST    Gets SOB a lot but has COPD, still smoking.      Past Medical History:  Diagnosis Date   Acute postoperative pain    Anemia    Anxiety    Arthritis    Asthma    Chronic BILATERAL sacroiliac joint pain    Chronic knee pain    Chronic pain    COPD (chronic obstructive pulmonary disease) (HCC)    Current every day smoker    DDD (degenerative disc disease), lumbar    Dyspnea    GERD (gastroesophageal reflux disease)    History of carpal tunnel release    Hypertension    Levoscoliosis    Lumbar facet arthropathy    Lumbar radiculitis    Lumbar stenosis with neurogenic claudication    Muscle cramps    Osteoarthritis    Osteoarthritis of facet joint of lumbar spine      Past Surgical History:  Procedure Laterality Date   ABDOMINAL HYSTERECTOMY     carpel tunnel Bilateral    COLONOSCOPY     DENTAL SURGERY     x 2   ESOPHAGOGASTRODUODENOSCOPY     FOOT SURGERY Left    HERNIA REPAIR     INSERTION OF MESH  06/24/2023   Procedure: INSERTION OF MESH;  Surgeon: Henrene Dodge, MD;  Location: [facility];  Service: General;;   ROTATOR CUFF REPAIR Right    XI ROBOTIC ASSISTED VENTRAL HERNIA N/A 06/24/2023   Procedure: XI ROBOTIC ASSISTED VENTRAL HERNIA;  Surgeon: Henrene Dodge, MD;  Location: [facility];  Service: General;  Laterality: N/A;     Current Outpatient Medications  Medication Sig Dispense Refill   gabapentin (NEURONTIN) 100 MG capsule Take 100 mg by mouth daily.     acetaminophen (TYLENOL) 500 MG tablet Take 500 mg by mouth every 6 (six) hours as needed. 2 tabs     albuterol (PROVENTIL HFA;VENTOLIN HFA) 108 (90 Base) MCG/ACT inhaler Inhale 2 puffs into  the lungs every 4 (four) hours as needed for wheezing or shortness of breath.     albuterol (PROVENTIL) (2.5 MG/3ML) 0.083% nebulizer solution Take 2.5 mg by nebulization daily. A bedtime     alendronate (FOSAMAX) 70 MG tablet Take 70 mg by mouth once a week. Take with a full glass of water on an empty stomach.     Camphor-Menthol-Methyl Sal (SALONPAS) 3.12-05-08 % PTCH Apply topically as needed.     cetirizine (ZYRTEC) 10 MG tablet Take 10 mg by mouth daily.     Cholecalciferol (VITAMIN D3) 2000 units TABS Take 2,000 Units by mouth daily.     clopidogrel (PLAVIX) 75 MG tablet Take 1 tablet (75 mg total) by mouth daily. 30 tablet 0   Cranberry 500 MG CAPS Take 500 mg by mouth daily.     dapagliflozin propanediol (FARXIGA) 10 MG TABS tablet Take 1 tablet (10 mg total) by mouth daily before breakfast. 30 tablet 3   diphenhydrAMINE (BENADRYL) 25 mg capsule Take 50 mg by mouth daily. 2 tabs every am     etodolac (LODINE) 400 MG tablet Take 400 mg by mouth  2 (two) times daily.     fluticasone (FLONASE) 50 MCG/ACT nasal spray Place 2 sprays into both nostrils in the morning and at bedtime.     fluticasone furoate-vilanterol (BREO ELLIPTA) 100-25 MCG/ACT AEPB Inhale 1 puff into the lungs daily.     losartan (COZAAR) 50 MG tablet Take 1 tablet (50 mg total) by mouth daily. 90 tablet 3   montelukast (SINGULAIR) 10 MG tablet Take 10 mg by mouth at bedtime.     pantoprazole (PROTONIX) 40 MG tablet Take 1 tablet (40 mg total) by mouth daily. 30 tablet 1   psyllium (METAMUCIL) 58.6 % powder Take 1 packet by mouth daily.     rosuvastatin (CRESTOR) 20 MG tablet Take 1 tablet (20 mg total) by mouth daily. 90 tablet 1   sertraline (ZOLOFT) 100 MG tablet Take 100 mg by mouth daily.     spironolactone (ALDACTONE) 25 MG tablet Take 0.5 tablets (12.5 mg total) by mouth daily. 30 tablet 11   traMADol (ULTRAM) 50 MG tablet Take 50 mg by mouth every 12 (twelve) hours as needed.     vitamin E 180 MG (400 UNITS) capsule  Take 400 Units by mouth daily.     zolpidem (AMBIEN) 5 MG tablet Take 5 mg by mouth at bedtime as needed for sleep.     No current facility-administered medications for this visit.    Allergies:   Spiriva handihaler [tiotropium bromide monohydrate], Tiotropium, Advair diskus [fluticasone-salmeterol], Erythromycin, Sulfa antibiotics, and Trazodone and nefazodone    Social History:   reports that she has been smoking cigarettes. She started smoking about 54 years ago. She has a 82.2 pack-year smoking history. She has been exposed to tobacco smoke. She has never used smokeless tobacco. She reports that she does not drink alcohol and does not use drugs.   Family History:  family history includes Breast cancer in her paternal aunt; Breast cancer (age of onset: 75) in her mother.    ROS:     Review of Systems  Constitutional: Negative.   HENT: Negative.    Eyes: Negative.   Respiratory: Negative.    Gastrointestinal: Negative.   Genitourinary: Negative.   Musculoskeletal: Negative.   Skin: Negative.   Neurological: Negative.   Endo/Heme/Allergies: Negative.   Psychiatric/Behavioral: Negative.    All other systems reviewed and are negative.     All other systems are reviewed and negative.    PHYSICAL EXAM: VS:  BP 110/72   Ht 5' (1.524 m)   Wt 193 lb (87.5 kg)   BMI 37.69 kg/m  , BMI Body mass index is 37.69 kg/m. Last weight:  Wt Readings from Last 3 Encounters:  03/07/24 193 lb (87.5 kg)  02/10/24 192 lb (87.1 kg)  12/23/23 198 lb 9.6 oz (90.1 kg)     Physical Exam Constitutional:      Appearance: Normal appearance.  Cardiovascular:     Rate and Rhythm: Normal rate and regular rhythm.     Heart sounds: Normal heart sounds.  Pulmonary:     Effort: Pulmonary effort is normal.     Breath sounds: Normal breath sounds.  Musculoskeletal:     Right lower leg: No edema.     Left lower leg: No edema.  Neurological:     Mental Status: She is alert.       EKG:    Recent Labs: 08/27/2023: ALT 14; Hemoglobin 12.8; Platelets 308 12/09/2023: BUN 15; Creatinine, Ser 0.84; Potassium 4.6; Sodium 140    Lipid Panel  Component Value Date/Time   CHOL 162 07/09/2023 1205   TRIG 155 (H) 07/09/2023 1205   HDL 49 07/09/2023 1205   CHOLHDL 3.3 07/09/2023 1205   LDLCALC 86 07/09/2023 1205      Other studies Reviewed: Additional studies/ records that were reviewed today include:  Review of the above records demonstrates:      07/09/2023   11:33 AM  PAD Screen  Previous PAD dx? No  Previous surgical procedure? No  Pain with walking? No  Feet/toe relief with dangling? No  Painful, non-healing ulcers? No  Extremities discolored? No      ASSESSMENT AND PLAN:    ICD-10-CM   1. SOB (shortness of breath)  R06.02    stress test was normal, poor exercise tolerance, echo diastolic dysfunction    2. CHF (congestive heart failure), NYHA class III, acute on chronic, diastolic (HCC)  I50.33    on farxiga as has diastolic dysfunction, normal LVEF    3. Chronic coronary microvascular dysfunction  I25.85     4. Mixed hyperlipidemia  E78.2        Problem List Items Addressed This Visit   None Visit Diagnoses       SOB (shortness of breath)    -  Primary   stress test was normal, poor exercise tolerance, echo diastolic dysfunction     CHF (congestive heart failure), NYHA class III, acute on chronic, diastolic (HCC)       on farxiga as has diastolic dysfunction, normal LVEF     Chronic coronary microvascular dysfunction         Mixed hyperlipidemia              Disposition:   Return in about 3 months (around 06/06/2024).    Total time spent: 30 minutes  Signed,  Adrian Blackwater, MD  03/07/2024 10:13 AM    [facility]

## 2024-03-22 ENCOUNTER — Ambulatory Visit: Payer: Medicare HMO | Admitting: Medical

## 2024-04-26 ENCOUNTER — Other Ambulatory Visit: Payer: Self-pay | Admitting: Family Medicine

## 2024-04-26 DIAGNOSIS — Z1231 Encounter for screening mammogram for malignant neoplasm of breast: Secondary | ICD-10-CM

## 2024-05-10 ENCOUNTER — Other Ambulatory Visit: Payer: Self-pay | Admitting: Acute Care

## 2024-05-10 DIAGNOSIS — Z87891 Personal history of nicotine dependence: Secondary | ICD-10-CM

## 2024-05-10 DIAGNOSIS — Z122 Encounter for screening for malignant neoplasm of respiratory organs: Secondary | ICD-10-CM

## 2024-05-10 DIAGNOSIS — F17210 Nicotine dependence, cigarettes, uncomplicated: Secondary | ICD-10-CM

## 2024-05-11 ENCOUNTER — Ambulatory Visit
Admission: RE | Admit: 2024-05-11 | Discharge: 2024-05-11 | Disposition: A | Discharge status: Home / Self Care | Source: Ambulatory Visit | Attending: Family Medicine | Admitting: Family Medicine

## 2024-05-11 DIAGNOSIS — Z1231 Encounter for screening mammogram for malignant neoplasm of breast: Secondary | ICD-10-CM | POA: Insufficient documentation

## 2024-06-06 ENCOUNTER — Ambulatory Visit: Admitting: Cardiovascular Disease

## 2024-06-13 ENCOUNTER — Ambulatory Visit: Admitting: Cardiovascular Disease

## 2024-06-22 ENCOUNTER — Ambulatory Visit (INDEPENDENT_AMBULATORY_CARE_PROVIDER_SITE_OTHER): Admitting: Cardiovascular Disease

## 2024-06-22 ENCOUNTER — Encounter: Payer: Self-pay | Admitting: Cardiovascular Disease

## 2024-06-22 VITALS — BP 104/62 | HR 102 | Ht 60.0 in | Wt 193.0 lb

## 2024-06-22 DIAGNOSIS — R0602 Shortness of breath: Secondary | ICD-10-CM | POA: Diagnosis not present

## 2024-06-22 DIAGNOSIS — I2585 Chronic coronary microvascular dysfunction: Secondary | ICD-10-CM

## 2024-06-22 DIAGNOSIS — F17209 Nicotine dependence, unspecified, with unspecified nicotine-induced disorders: Secondary | ICD-10-CM | POA: Diagnosis not present

## 2024-06-22 DIAGNOSIS — I5033 Acute on chronic diastolic (congestive) heart failure: Secondary | ICD-10-CM

## 2024-06-22 DIAGNOSIS — E782 Mixed hyperlipidemia: Secondary | ICD-10-CM

## 2024-06-22 DIAGNOSIS — Z0130 Encounter for examination of blood pressure without abnormal findings: Secondary | ICD-10-CM

## 2024-06-22 NOTE — Progress Notes (Signed)
 Cardiology Office Note   Date:  06/22/2024   ID:  Arriana, Lohmann 02-03-1956, MRN [id number]  PCP:  Lorel Maxie LABOR, MD  Cardiologist:  Denyse Bathe, MD      History of Present Illness: Sandra Strong is a 68 y.o. female who presents for  Chief Complaint  Patient presents with   Follow-up    3 months follow up    Feels tired, no energy.      Past Medical History:  Diagnosis Date   Acute postoperative pain    Anemia    Anxiety    Arthritis    Asthma    Chronic BILATERAL sacroiliac joint pain    Chronic knee pain    Chronic pain    COPD (chronic obstructive pulmonary disease) (HCC)    Current every day smoker    DDD (degenerative disc disease), lumbar    Dyspnea    GERD (gastroesophageal reflux disease)    History of carpal tunnel release    Hypertension    Levoscoliosis    Lumbar facet arthropathy    Lumbar radiculitis    Lumbar stenosis with neurogenic claudication    Muscle cramps    Osteoarthritis    Osteoarthritis of facet joint of lumbar spine      Past Surgical History:  Procedure Laterality Date   ABDOMINAL HYSTERECTOMY     carpel tunnel Bilateral    COLONOSCOPY     DENTAL SURGERY     x 2   ESOPHAGOGASTRODUODENOSCOPY     FOOT SURGERY Left    HERNIA REPAIR     INSERTION OF MESH  06/24/2023   Procedure: INSERTION OF MESH;  Surgeon: Desiderio Schanz, MD;  Location: [facility];  Service: General;;   ROTATOR CUFF REPAIR Right    XI ROBOTIC ASSISTED VENTRAL HERNIA N/A 06/24/2023   Procedure: XI ROBOTIC ASSISTED VENTRAL HERNIA;  Surgeon: Desiderio Schanz, MD;  Location: [facility];  Service: General;  Laterality: N/A;     Current Outpatient Medications  Medication Sig Dispense Refill   acetaminophen  (TYLENOL ) 500 MG tablet Take 500 mg by mouth every 6 (six) hours as needed. 2 tabs     albuterol  (PROVENTIL  HFA;VENTOLIN  HFA) 108 (90 Base) MCG/ACT inhaler Inhale 2 puffs into the lungs every 4 (four) hours as needed for wheezing or shortness of breath.      albuterol  (PROVENTIL ) (2.5 MG/3ML) 0.083% nebulizer solution Take 2.5 mg by nebulization daily. A bedtime     alendronate (FOSAMAX) 70 MG tablet Take 70 mg by mouth once a week. Take with a full glass of water on an empty stomach.     Camphor-Menthol-Methyl Sal (SALONPAS) 3.12-05-08 % PTCH Apply topically as needed.     cetirizine (ZYRTEC) 10 MG tablet Take 10 mg by mouth daily.     Cholecalciferol (VITAMIN D3) 2000 units TABS Take 2,000 Units by mouth daily.     clopidogrel  (PLAVIX ) 75 MG tablet Take 1 tablet (75 mg total) by mouth daily. 30 tablet 0   Cranberry 500 MG CAPS Take 500 mg by mouth daily.     dapagliflozin  propanediol (FARXIGA ) 10 MG TABS tablet Take 1 tablet (10 mg total) by mouth daily before breakfast. 30 tablet 3   diphenhydrAMINE (BENADRYL) 25 mg capsule Take 50 mg by mouth daily. 2 tabs every am     etodolac (LODINE) 400 MG tablet Take 400 mg by mouth 2 (two) times daily.     fluticasone (FLONASE) 50 MCG/ACT nasal spray Place 2 sprays  into both nostrils in the morning and at bedtime.     fluticasone furoate-vilanterol (BREO ELLIPTA) 100-25 MCG/ACT AEPB Inhale 1 puff into the lungs daily.     gabapentin  (NEURONTIN ) 100 MG capsule Take 100 mg by mouth daily.     lansoprazole (PREVACID) 30 MG capsule Take 30 mg by mouth daily.     losartan  (COZAAR ) 50 MG tablet Take 1 tablet (50 mg total) by mouth daily. 90 tablet 3   montelukast (SINGULAIR) 10 MG tablet Take 10 mg by mouth at bedtime.     psyllium (METAMUCIL) 58.6 % powder Take 1 packet by mouth daily.     rosuvastatin  (CRESTOR ) 20 MG tablet Take 1 tablet (20 mg total) by mouth daily. 90 tablet 1   sertraline (ZOLOFT) 100 MG tablet Take 100 mg by mouth daily.     spironolactone  (ALDACTONE ) 25 MG tablet Take 0.5 tablets (12.5 mg total) by mouth daily. 30 tablet 11   traMADol (ULTRAM) 50 MG tablet Take 50 mg by mouth every 12 (twelve) hours as needed.     vitamin E 180 MG (400 UNITS) capsule Take 400 Units by mouth daily.      zolpidem (AMBIEN) 5 MG tablet Take 5 mg by mouth at bedtime as needed for sleep.     No current facility-administered medications for this visit.    Allergies:   Spiriva handihaler [tiotropium bromide monohydrate], Tiotropium, Advair diskus [fluticasone-salmeterol], Erythromycin, Sulfa antibiotics, and Trazodone and nefazodone    Social History:   reports that she has been smoking cigarettes. She started smoking about 55 years ago. She has a 82.7 pack-year smoking history. She has been exposed to tobacco smoke. She has never used smokeless tobacco. She reports that she does not drink alcohol and does not use drugs.   Family History:  family history includes Breast cancer in her maternal grandmother and paternal aunt; Breast cancer (age of onset: 42) in her mother.    ROS:     Review of Systems  Constitutional: Negative.   HENT: Negative.    Eyes: Negative.   Respiratory: Negative.    Gastrointestinal: Negative.   Genitourinary: Negative.   Musculoskeletal: Negative.   Skin: Negative.   Neurological: Negative.   Endo/Heme/Allergies: Negative.   Psychiatric/Behavioral: Negative.    All other systems reviewed and are negative.     All other systems are reviewed and negative.    PHYSICAL EXAM: VS:  BP 104/62   Pulse (!) 102   Ht 5' (1.524 m)   Wt 193 lb (87.5 kg)   SpO2 96%   BMI 37.69 kg/m  , BMI Body mass index is 37.69 kg/m. Last weight:  Wt Readings from Last 3 Encounters:  06/22/24 193 lb (87.5 kg)  03/07/24 193 lb (87.5 kg)  02/10/24 192 lb (87.1 kg)     Physical Exam Constitutional:      Appearance: Normal appearance.  Cardiovascular:     Rate and Rhythm: Normal rate and regular rhythm.     Heart sounds: Normal heart sounds.  Pulmonary:     Effort: Pulmonary effort is normal.     Breath sounds: Normal breath sounds.  Musculoskeletal:     Right lower leg: No edema.     Left lower leg: No edema.  Neurological:     Mental Status: She is alert.        EKG:   Recent Labs: 08/27/2023: ALT 14; Hemoglobin 12.8; Platelets 308 12/09/2023: BUN 15; Creatinine, Ser 0.84; Potassium 4.6; Sodium 140  Lipid Panel    Component Value Date/Time   CHOL 162 07/09/2023 1205   TRIG 155 (H) 07/09/2023 1205   HDL 49 07/09/2023 1205   CHOLHDL 3.3 07/09/2023 1205   LDLCALC 86 07/09/2023 1205      Other studies Reviewed: Additional studies/ records that were reviewed today include:  Review of the above records demonstrates:      07/09/2023   11:33 AM  PAD Screen  Previous PAD dx? No  Previous surgical procedure? No  Pain with walking? No  Feet/toe relief with dangling? No  Painful, non-healing ulcers? No  Extremities discolored? No      ASSESSMENT AND PLAN:    ICD-10-CM   1. CHF (congestive heart failure), NYHA class III, acute on chronic, diastolic (HCC)  I50.33    on farxiga  getting better HR is now 80, and at home in 70s    2. SOB (shortness of breath)  R06.02    need to quit smoking    3. Chronic coronary microvascular dysfunction  I25.85     4. Mixed hyperlipidemia  E78.2        Problem List Items Addressed This Visit   None Visit Diagnoses       CHF (congestive heart failure), NYHA class III, acute on chronic, diastolic (HCC)    -  Primary   on farxiga  getting better HR is now 80, and at home in 70s     SOB (shortness of breath)       need to quit smoking     Chronic coronary microvascular dysfunction         Mixed hyperlipidemia              Disposition:   Return in about 3 months (around 09/22/2024).    Total time spent: 30 minutes  Signed,  Denyse Bathe, MD  06/22/2024 10:18 AM    [facility]

## 2024-07-04 ENCOUNTER — Other Ambulatory Visit: Payer: Self-pay | Admitting: Cardiovascular Disease

## 2024-07-04 DIAGNOSIS — I2585 Chronic coronary microvascular dysfunction: Secondary | ICD-10-CM

## 2024-07-04 DIAGNOSIS — E782 Mixed hyperlipidemia: Secondary | ICD-10-CM

## 2024-07-04 DIAGNOSIS — I5033 Acute on chronic diastolic (congestive) heart failure: Secondary | ICD-10-CM

## 2024-07-04 DIAGNOSIS — R0602 Shortness of breath: Secondary | ICD-10-CM

## 2024-07-28 ENCOUNTER — Ambulatory Visit
Admission: RE | Admit: 2024-07-28 | Discharge: 2024-07-28 | Disposition: A | Discharge status: Home / Self Care | Source: Ambulatory Visit | Attending: Acute Care | Admitting: Acute Care

## 2024-07-28 DIAGNOSIS — F17210 Nicotine dependence, cigarettes, uncomplicated: Secondary | ICD-10-CM | POA: Insufficient documentation

## 2024-07-28 DIAGNOSIS — Z87891 Personal history of nicotine dependence: Secondary | ICD-10-CM | POA: Diagnosis present

## 2024-07-28 DIAGNOSIS — Z122 Encounter for screening for malignant neoplasm of respiratory organs: Secondary | ICD-10-CM | POA: Insufficient documentation

## 2024-08-07 ENCOUNTER — Other Ambulatory Visit: Payer: Self-pay | Admitting: Cardiovascular Disease

## 2024-08-07 DIAGNOSIS — I5033 Acute on chronic diastolic (congestive) heart failure: Secondary | ICD-10-CM

## 2024-08-07 DIAGNOSIS — E782 Mixed hyperlipidemia: Secondary | ICD-10-CM

## 2024-08-07 DIAGNOSIS — I2585 Chronic coronary microvascular dysfunction: Secondary | ICD-10-CM

## 2024-08-07 DIAGNOSIS — R0602 Shortness of breath: Secondary | ICD-10-CM

## 2024-08-15 ENCOUNTER — Other Ambulatory Visit: Payer: Self-pay | Admitting: Cardiovascular Disease

## 2024-08-16 ENCOUNTER — Other Ambulatory Visit: Payer: Self-pay | Admitting: Acute Care

## 2024-08-16 DIAGNOSIS — Z122 Encounter for screening for malignant neoplasm of respiratory organs: Secondary | ICD-10-CM

## 2024-08-16 DIAGNOSIS — Z87891 Personal history of nicotine dependence: Secondary | ICD-10-CM

## 2024-08-16 DIAGNOSIS — F17210 Nicotine dependence, cigarettes, uncomplicated: Secondary | ICD-10-CM

## 2024-09-04 ENCOUNTER — Other Ambulatory Visit: Payer: Self-pay | Admitting: Cardiovascular Disease

## 2024-09-04 DIAGNOSIS — E782 Mixed hyperlipidemia: Secondary | ICD-10-CM

## 2024-09-04 DIAGNOSIS — R0602 Shortness of breath: Secondary | ICD-10-CM

## 2024-09-04 DIAGNOSIS — I2585 Chronic coronary microvascular dysfunction: Secondary | ICD-10-CM

## 2024-09-04 DIAGNOSIS — I5033 Acute on chronic diastolic (congestive) heart failure: Secondary | ICD-10-CM

## 2024-09-15 ENCOUNTER — Ambulatory Visit: Admitting: Cardiovascular Disease

## 2024-09-18 ENCOUNTER — Encounter: Payer: Self-pay | Admitting: Cardiovascular Disease

## 2024-09-18 ENCOUNTER — Ambulatory Visit (INDEPENDENT_AMBULATORY_CARE_PROVIDER_SITE_OTHER): Admitting: Cardiovascular Disease

## 2024-09-18 VITALS — BP 110/74 | HR 86 | Ht 60.0 in | Wt 201.0 lb

## 2024-09-18 DIAGNOSIS — K219 Gastro-esophageal reflux disease without esophagitis: Secondary | ICD-10-CM

## 2024-09-18 DIAGNOSIS — Z0130 Encounter for examination of blood pressure without abnormal findings: Secondary | ICD-10-CM

## 2024-09-18 DIAGNOSIS — E782 Mixed hyperlipidemia: Secondary | ICD-10-CM | POA: Diagnosis not present

## 2024-09-18 DIAGNOSIS — R0602 Shortness of breath: Secondary | ICD-10-CM | POA: Diagnosis not present

## 2024-09-18 DIAGNOSIS — F17210 Nicotine dependence, cigarettes, uncomplicated: Secondary | ICD-10-CM | POA: Diagnosis not present

## 2024-09-18 DIAGNOSIS — I2585 Chronic coronary microvascular dysfunction: Secondary | ICD-10-CM

## 2024-09-18 DIAGNOSIS — I5033 Acute on chronic diastolic (congestive) heart failure: Secondary | ICD-10-CM

## 2024-09-18 MED ORDER — CLOPIDOGREL BISULFATE 75 MG PO TABS: 75.0000 mg | ORAL_TABLET | Freq: Every day | ORAL | 1 refills | Status: AC

## 2024-09-18 NOTE — Progress Notes (Signed)
 Cardiology Office Note   Date:  09/18/2024   ID:  Sandra Strong, Sandra Strong 1956-11-28, MRN [id number]  PCP:  Lorel Maxie LABOR, MD  Cardiologist:  Denyse Bathe, MD      History of Present Illness: Sandra Strong is a 68 y.o. female who presents for  Chief Complaint  Patient presents with   Follow-up    3 months follow up    Has SOB, but still smoking.      Past Medical History:  Diagnosis Date   Acute postoperative pain    Anemia    Anxiety    Arthritis    Asthma    Chronic BILATERAL sacroiliac joint pain    Chronic knee pain    Chronic pain    COPD (chronic obstructive pulmonary disease) (HCC)    Current every day smoker    DDD (degenerative disc disease), lumbar    Dyspnea    GERD (gastroesophageal reflux disease)    History of carpal tunnel release    Hypertension    Levoscoliosis    Lumbar facet arthropathy    Lumbar radiculitis    Lumbar stenosis with neurogenic claudication    Muscle cramps    Osteoarthritis    Osteoarthritis of facet joint of lumbar spine      Past Surgical History:  Procedure Laterality Date   ABDOMINAL HYSTERECTOMY     carpel tunnel Bilateral    COLONOSCOPY     DENTAL SURGERY     x 2   ESOPHAGOGASTRODUODENOSCOPY     FOOT SURGERY Left    HERNIA REPAIR     INSERTION OF MESH  06/24/2023   Procedure: INSERTION OF MESH;  Surgeon: Desiderio Schanz, MD;  Location: [facility];  Service: General;;   ROTATOR CUFF REPAIR Right    XI ROBOTIC ASSISTED VENTRAL HERNIA N/A 06/24/2023   Procedure: XI ROBOTIC ASSISTED VENTRAL HERNIA;  Surgeon: Desiderio Schanz, MD;  Location: [facility];  Service: General;  Laterality: N/A;     Current Outpatient Medications  Medication Sig Dispense Refill   busPIRone (BUSPAR) 10 MG tablet Take 10 mg by mouth 2 (two) times daily.     DEXILANT 30 MG capsule DR Take 1 capsule by mouth daily.     escitalopram (LEXAPRO) 10 MG tablet Take 10 mg by mouth daily.     montelukast (SINGULAIR) 10 MG tablet Take 10 mg by mouth at  bedtime.     varenicline (CHANTIX) 1 MG tablet Take by mouth.     acetaminophen  (TYLENOL ) 500 MG tablet Take 500 mg by mouth every 6 (six) hours as needed. 2 tabs     albuterol  (PROVENTIL  HFA;VENTOLIN  HFA) 108 (90 Base) MCG/ACT inhaler Inhale 2 puffs into the lungs every 4 (four) hours as needed for wheezing or shortness of breath.     albuterol  (PROVENTIL ) (2.5 MG/3ML) 0.083% nebulizer solution Take 2.5 mg by nebulization daily. A bedtime     alendronate (FOSAMAX) 70 MG tablet Take 70 mg by mouth once a week. Take with a full glass of water on an empty stomach.     Camphor-Menthol-Methyl Sal (SALONPAS) 3.12-05-08 % PTCH Apply topically as needed.     cetirizine (ZYRTEC) 10 MG tablet Take 10 mg by mouth daily.     Cholecalciferol (VITAMIN D3) 2000 units TABS Take 2,000 Units by mouth daily.     clopidogrel  (PLAVIX ) 75 MG tablet Take 1 tablet (75 mg total) by mouth daily. 90 tablet 1   Cranberry 500 MG CAPS Take 500  mg by mouth daily.     diphenhydrAMINE (BENADRYL) 25 mg capsule Take 50 mg by mouth daily. 2 tabs every am     etodolac (LODINE) 400 MG tablet Take 400 mg by mouth 2 (two) times daily.     FARXIGA  10 MG TABS tablet TAKE 1 TABLET BY MOUTH ONCE A DAY BEFORE BREAKFAST 30 tablet 0   fluticasone (FLONASE) 50 MCG/ACT nasal spray Place 2 sprays into both nostrils in the morning and at bedtime.     fluticasone furoate-vilanterol (BREO ELLIPTA) 100-25 MCG/ACT AEPB Inhale 1 puff into the lungs daily.     gabapentin  (NEURONTIN ) 100 MG capsule Take 100 mg by mouth daily.     losartan  (COZAAR ) 50 MG tablet Take 1 tablet (50 mg total) by mouth daily. 90 tablet 3   psyllium (METAMUCIL) 58.6 % powder Take 1 packet by mouth daily.     rosuvastatin  (CRESTOR ) 20 MG tablet TAKE 1 TABLET BY MOUTH ONCE DAILY. 90 tablet 0   spironolactone  (ALDACTONE ) 25 MG tablet Take 0.5 tablets (12.5 mg total) by mouth daily. (Patient not taking: Reported on 09/18/2024) 30 tablet 11   traMADol (ULTRAM) 50 MG tablet Take 50  mg by mouth every 12 (twelve) hours as needed.     vitamin E 180 MG (400 UNITS) capsule Take 400 Units by mouth daily.     zolpidem (AMBIEN) 5 MG tablet Take 5 mg by mouth at bedtime as needed for sleep.     No current facility-administered medications for this visit.    Allergies:   Spiriva handihaler [tiotropium bromide], Tiotropium, Advair diskus [fluticasone-salmeterol], Erythromycin, Sulfa antibiotics, and Trazodone and nefazodone    Social History:   reports that she has been smoking cigarettes. She started smoking about 55 years ago. She has a 83 pack-year smoking history. She has been exposed to tobacco smoke. She has never used smokeless tobacco. She reports that she does not drink alcohol and does not use drugs.   Family History:  family history includes Breast cancer in her maternal grandmother and paternal aunt; Breast cancer (age of onset: 13) in her mother.    ROS:     Review of Systems  Constitutional: Negative.   HENT: Negative.    Eyes: Negative.   Respiratory: Negative.    Gastrointestinal: Negative.   Genitourinary: Negative.   Musculoskeletal: Negative.   Skin: Negative.   Neurological: Negative.   Endo/Heme/Allergies: Negative.   Psychiatric/Behavioral: Negative.    All other systems reviewed and are negative.     All other systems are reviewed and negative.    PHYSICAL EXAM: VS:  BP 110/74   Pulse 86   Ht 5' (1.524 m)   Wt 201 lb (91.2 kg)   SpO2 96%   BMI 39.26 kg/m  , BMI Body mass index is 39.26 kg/m. Last weight:  Wt Readings from Last 3 Encounters:  09/18/24 201 lb (91.2 kg)  06/22/24 193 lb (87.5 kg)  03/07/24 193 lb (87.5 kg)     Physical Exam Constitutional:      Appearance: Normal appearance.  Cardiovascular:     Rate and Rhythm: Normal rate and regular rhythm.     Heart sounds: Normal heart sounds.  Pulmonary:     Effort: Pulmonary effort is normal.     Breath sounds: Normal breath sounds.  Musculoskeletal:     Right lower  leg: No edema.     Left lower leg: No edema.  Neurological:     Mental Status: She is alert.  EKG:   Recent Labs: 12/09/2023: BUN 15; Creatinine, Ser 0.84; Potassium 4.6; Sodium 140    Lipid Panel    Component Value Date/Time   CHOL 162 07/09/2023 1205   TRIG 155 (H) 07/09/2023 1205   HDL 49 07/09/2023 1205   CHOLHDL 3.3 07/09/2023 1205   LDLCALC 86 07/09/2023 1205      Other studies Reviewed: Additional studies/ records that were reviewed today include:  Review of the above records demonstrates:      07/09/2023   11:33 AM  PAD Screen  Previous PAD dx? No  Previous surgical procedure? No  Pain with walking? No  Feet/toe relief with dangling? No  Painful, non-healing ulcers? No  Extremities discolored? No      ASSESSMENT AND PLAN:    ICD-10-CM   1. SOB (shortness of breath)  R06.02 clopidogrel  (PLAVIX ) 75 MG tablet   Feels better off aldactone  12.5 as had tired feeling. Continue farxiga .    2. CHF (congestive heart failure), NYHA class III, acute on chronic, diastolic (HCC)  I50.33 clopidogrel  (PLAVIX ) 75 MG tablet    3. Chronic coronary microvascular dysfunction  I25.85 clopidogrel  (PLAVIX ) 75 MG tablet    4. Mixed hyperlipidemia  E78.2 clopidogrel  (PLAVIX ) 75 MG tablet    5. Gastroesophageal reflux disease without esophagitis  K21.9 clopidogrel  (PLAVIX ) 75 MG tablet       Problem List Items Addressed This Visit       Digestive   GERD (gastroesophageal reflux disease) (Chronic)   Relevant Medications   DEXILANT 30 MG capsule DR   clopidogrel  (PLAVIX ) 75 MG tablet   Other Visit Diagnoses       SOB (shortness of breath)    -  Primary   Feels better off aldactone  12.5 as had tired feeling. Continue farxiga .   Relevant Medications   clopidogrel  (PLAVIX ) 75 MG tablet     CHF (congestive heart failure), NYHA class III, acute on chronic, diastolic (HCC)       Relevant Medications   clopidogrel  (PLAVIX ) 75 MG tablet     Chronic coronary  microvascular dysfunction       Relevant Medications   clopidogrel  (PLAVIX ) 75 MG tablet     Mixed hyperlipidemia       Relevant Medications   clopidogrel  (PLAVIX ) 75 MG tablet          Disposition:   Return in about 3 months (around 12/19/2024).    Total time spent: 35 minutes  Signed,  Denyse Bathe, MD  09/18/2024 10:38 AM    [facility]

## 2024-09-28 ENCOUNTER — Other Ambulatory Visit: Payer: Self-pay | Admitting: Cardiovascular Disease

## 2024-09-28 DIAGNOSIS — R0602 Shortness of breath: Secondary | ICD-10-CM

## 2024-09-28 DIAGNOSIS — I5033 Acute on chronic diastolic (congestive) heart failure: Secondary | ICD-10-CM

## 2024-09-28 DIAGNOSIS — E782 Mixed hyperlipidemia: Secondary | ICD-10-CM

## 2024-09-28 DIAGNOSIS — I2585 Chronic coronary microvascular dysfunction: Secondary | ICD-10-CM

## 2024-10-31 ENCOUNTER — Other Ambulatory Visit: Payer: Self-pay | Admitting: Cardiovascular Disease

## 2024-10-31 DIAGNOSIS — I5033 Acute on chronic diastolic (congestive) heart failure: Secondary | ICD-10-CM

## 2024-10-31 DIAGNOSIS — E782 Mixed hyperlipidemia: Secondary | ICD-10-CM

## 2024-10-31 DIAGNOSIS — R0602 Shortness of breath: Secondary | ICD-10-CM

## 2024-10-31 DIAGNOSIS — I2585 Chronic coronary microvascular dysfunction: Secondary | ICD-10-CM

## 2024-11-13 ENCOUNTER — Other Ambulatory Visit: Payer: Self-pay | Admitting: Cardiovascular Disease

## 2024-12-04 ENCOUNTER — Other Ambulatory Visit: Payer: Self-pay | Admitting: Cardiovascular Disease

## 2024-12-04 DIAGNOSIS — E782 Mixed hyperlipidemia: Secondary | ICD-10-CM

## 2024-12-04 DIAGNOSIS — I5033 Acute on chronic diastolic (congestive) heart failure: Secondary | ICD-10-CM

## 2024-12-04 DIAGNOSIS — R0602 Shortness of breath: Secondary | ICD-10-CM

## 2024-12-04 DIAGNOSIS — I2585 Chronic coronary microvascular dysfunction: Secondary | ICD-10-CM

## 2024-12-07 ENCOUNTER — Encounter: Payer: Self-pay | Admitting: Cardiovascular Disease

## 2024-12-07 ENCOUNTER — Ambulatory Visit (INDEPENDENT_AMBULATORY_CARE_PROVIDER_SITE_OTHER): Admitting: Cardiovascular Disease

## 2024-12-07 VITALS — BP 124/82 | HR 105 | Ht 60.0 in | Wt 203.0 lb

## 2024-12-07 DIAGNOSIS — K219 Gastro-esophageal reflux disease without esophagitis: Secondary | ICD-10-CM | POA: Diagnosis not present

## 2024-12-07 DIAGNOSIS — Z0130 Encounter for examination of blood pressure without abnormal findings: Secondary | ICD-10-CM

## 2024-12-07 DIAGNOSIS — I2585 Chronic coronary microvascular dysfunction: Secondary | ICD-10-CM

## 2024-12-07 DIAGNOSIS — I5033 Acute on chronic diastolic (congestive) heart failure: Secondary | ICD-10-CM

## 2024-12-07 DIAGNOSIS — J069 Acute upper respiratory infection, unspecified: Secondary | ICD-10-CM | POA: Diagnosis not present

## 2024-12-07 DIAGNOSIS — E782 Mixed hyperlipidemia: Secondary | ICD-10-CM

## 2024-12-07 DIAGNOSIS — R0602 Shortness of breath: Secondary | ICD-10-CM | POA: Diagnosis not present

## 2024-12-07 NOTE — Progress Notes (Signed)
 "     Cardiology Office Note   Date:  12/07/2024   ID:  Sandra, Strong [birth year], MRN [id number]  PCP:  Lorel Maxie LABOR, MD  Cardiologist:  Denyse Bathe, MD      History of Present Illness: Sandra Strong is a 69 y.o. female who presents for  Chief Complaint  Patient presents with   Follow-up    3 months follow up    Has URI,  has cough/SOB,not checked for covid.      Past Medical History:  Diagnosis Date   Acute postoperative pain    Anemia    Anxiety    Arthritis    Asthma    Chronic BILATERAL sacroiliac joint pain    Chronic knee pain    Chronic pain    COPD (chronic obstructive pulmonary disease) (HCC)    Current every day smoker    DDD (degenerative disc disease), lumbar    Dyspnea    GERD (gastroesophageal reflux disease)    History of carpal tunnel release    Hypertension    Levoscoliosis    Lumbar facet arthropathy    Lumbar radiculitis    Lumbar stenosis with neurogenic claudication    Muscle cramps    Osteoarthritis    Osteoarthritis of facet joint of lumbar spine      Past Surgical History:  Procedure Laterality Date   ABDOMINAL HYSTERECTOMY     carpel tunnel Bilateral    COLONOSCOPY     DENTAL SURGERY     x 2   ESOPHAGOGASTRODUODENOSCOPY     FOOT SURGERY Left    HERNIA REPAIR     INSERTION OF MESH  06/24/2023   Procedure: INSERTION OF MESH;  Surgeon: Desiderio Schanz, MD;  Location: [facility];  Service: General;;   ROTATOR CUFF REPAIR Right    XI ROBOTIC ASSISTED VENTRAL HERNIA N/A 06/24/2023   Procedure: XI ROBOTIC ASSISTED VENTRAL HERNIA;  Surgeon: Desiderio Schanz, MD;  Location: [facility];  Service: General;  Laterality: N/A;     Current Outpatient Medications  Medication Sig Dispense Refill   acetaminophen  (TYLENOL ) 500 MG tablet Take 500 mg by mouth every 6 (six) hours as needed. 2 tabs     albuterol  (PROVENTIL  HFA;VENTOLIN  HFA) 108 (90 Base) MCG/ACT inhaler Inhale 2 puffs into the lungs every 4 (four) hours as needed for wheezing or  shortness of breath.     albuterol  (PROVENTIL ) (2.5 MG/3ML) 0.083% nebulizer solution Take 2.5 mg by nebulization daily. A bedtime     alendronate (FOSAMAX) 70 MG tablet Take 70 mg by mouth once a week. Take with a full glass of water on an empty stomach.     busPIRone (BUSPAR) 10 MG tablet Take 10 mg by mouth 2 (two) times daily.     Camphor-Menthol-Methyl Sal (SALONPAS) 3.12-05-08 % PTCH Apply topically as needed.     cetirizine (ZYRTEC) 10 MG tablet Take 10 mg by mouth daily.     Cholecalciferol (VITAMIN D3) 2000 units TABS Take 2,000 Units by mouth daily.     clopidogrel  (PLAVIX ) 75 MG tablet Take 1 tablet (75 mg total) by mouth daily. 90 tablet 1   Cranberry 500 MG CAPS Take 500 mg by mouth daily.     DEXILANT 30 MG capsule DR Take 1 capsule by mouth daily.     diphenhydrAMINE (BENADRYL) 25 mg capsule Take 50 mg by mouth daily. 2 tabs every am     escitalopram (LEXAPRO) 10 MG tablet Take 10 mg by mouth  daily.     etodolac (LODINE) 400 MG tablet Take 400 mg by mouth 2 (two) times daily.     FARXIGA  10 MG TABS tablet TAKE 1 TABLET BY MOUTH ONCE A DAY BEFORE BREAKFAST 30 tablet 0   fluticasone (FLONASE) 50 MCG/ACT nasal spray Place 2 sprays into both nostrils in the morning and at bedtime.     fluticasone furoate-vilanterol (BREO ELLIPTA) 100-25 MCG/ACT AEPB Inhale 1 puff into the lungs daily.     gabapentin  (NEURONTIN ) 100 MG capsule Take 100 mg by mouth daily.     losartan  (COZAAR ) 50 MG tablet Take 1 tablet (50 mg total) by mouth daily. 90 tablet 3   montelukast (SINGULAIR) 10 MG tablet Take 10 mg by mouth at bedtime.     psyllium (METAMUCIL) 58.6 % powder Take 1 packet by mouth daily.     rosuvastatin  (CRESTOR ) 20 MG tablet TAKE 1 TABLET BY MOUTH ONCE DAILY. 90 tablet 0   spironolactone  (ALDACTONE ) 25 MG tablet Take 0.5 tablets (12.5 mg total) by mouth daily. (Patient not taking: Reported on 09/18/2024) 30 tablet 11   traMADol (ULTRAM) 50 MG tablet Take 50 mg by mouth every 12 (twelve)  hours as needed.     varenicline (CHANTIX) 1 MG tablet Take by mouth.     vitamin E 180 MG (400 UNITS) capsule Take 400 Units by mouth daily.     zolpidem (AMBIEN) 5 MG tablet Take 5 mg by mouth at bedtime as needed for sleep.     No current facility-administered medications for this visit.    Allergies:   Spiriva handihaler [tiotropium bromide], Tiotropium, Advair diskus [fluticasone-salmeterol], Erythromycin, Sulfa antibiotics, and Trazodone and nefazodone    Social History:   reports that she has been smoking cigarettes. She started smoking about 55 years ago. She has a 83.4 pack-year smoking history. She has been exposed to tobacco smoke. She has never used smokeless tobacco. She reports that she does not drink alcohol and does not use drugs.   Family History:  family history includes Breast cancer in her maternal grandmother and paternal aunt; Breast cancer (age of onset: 46) in her mother.    ROS:     Review of Systems  Constitutional: Negative.   HENT: Negative.    Eyes: Negative.   Respiratory: Negative.    Gastrointestinal: Negative.   Genitourinary: Negative.   Musculoskeletal: Negative.   Skin: Negative.   Neurological: Negative.   Endo/Heme/Allergies: Negative.   Psychiatric/Behavioral: Negative.    All other systems reviewed and are negative.     All other systems are reviewed and negative.    PHYSICAL EXAM: VS:  BP 124/82   Pulse (!) 105   Ht 5' (1.524 m)   Wt 203 lb (92.1 kg)   SpO2 95%   BMI 39.65 kg/m  , BMI Body mass index is 39.65 kg/m. Last weight:  Wt Readings from Last 3 Encounters:  12/07/24 203 lb (92.1 kg)  09/18/24 201 lb (91.2 kg)  06/22/24 193 lb (87.5 kg)     Physical Exam Constitutional:      Appearance: Normal appearance.  Cardiovascular:     Rate and Rhythm: Normal rate and regular rhythm.     Heart sounds: Normal heart sounds.  Pulmonary:     Effort: Pulmonary effort is normal.     Breath sounds: Normal breath sounds.   Musculoskeletal:     Right lower leg: No edema.     Left lower leg: No edema.  Neurological:  Mental Status: She is alert.       EKG:   Recent Labs: 12/09/2023: BUN 15; Creatinine, Ser 0.84; Potassium 4.6; Sodium 140    Lipid Panel    Component Value Date/Time   CHOL 162 07/09/2023 1205   TRIG 155 (H) 07/09/2023 1205   HDL 49 07/09/2023 1205   CHOLHDL 3.3 07/09/2023 1205   LDLCALC 86 07/09/2023 1205      Other studies Reviewed: Additional studies/ records that were reviewed today include:  Review of the above records demonstrates:      07/09/2023   11:33 AM  PAD Screen  Previous PAD dx? No  Previous surgical procedure? No  Pain with walking? No  Feet/toe relief with dangling? No  Painful, non-healing ulcers? No  Extremities discolored? No      ASSESSMENT AND PLAN:    ICD-10-CM   1. SOB (shortness of breath)  R06.02     2. CHF (congestive heart failure), NYHA class III, acute on chronic, diastolic (HCC)  I50.33    Already on farxiga  and aldactone     3. Chronic coronary microvascular dysfunction  I25.85     4. Mixed hyperlipidemia  E78.2     5. Gastroesophageal reflux disease without esophagitis  K21.9     6. Upper respiratory tract infection, unspecified type  J06.9    See primary on monday       Problem List Items Addressed This Visit       Digestive   GERD (gastroesophageal reflux disease) (Chronic)   Other Visit Diagnoses       SOB (shortness of breath)    -  Primary     CHF (congestive heart failure), NYHA class III, acute on chronic, diastolic (HCC)       Already on farxiga  and aldactone      Chronic coronary microvascular dysfunction         Mixed hyperlipidemia         Upper respiratory tract infection, unspecified type       See primary on monday          Disposition:   Return in about 3 months (around 03/07/2025).    Total time spent: 30 minutes  Signed,  Denyse Bathe, MD  12/07/2024 11:29 AM    [facility] "

## 2024-12-19 ENCOUNTER — Ambulatory Visit: Admitting: Cardiovascular Disease

## 2024-12-19 ENCOUNTER — Other Ambulatory Visit: Payer: Self-pay | Admitting: Cardiology

## 2024-12-27 ENCOUNTER — Other Ambulatory Visit: Payer: Self-pay | Admitting: Cardiology

## 2024-12-27 NOTE — Telephone Encounter (Signed)
Please contact pt for future appointment. Pt overdue for follow up.

## 2024-12-27 NOTE — Telephone Encounter (Signed)
 Left voice message to schedule an appointment for a follow up (refill)

## 2024-12-27 NOTE — Telephone Encounter (Signed)
 Pt called to say she will not be making f/u appt and does not want to receive any more calls from [facility]. Please advise.

## 2025-01-02 ENCOUNTER — Other Ambulatory Visit: Payer: Self-pay | Admitting: Cardiovascular Disease

## 2025-01-02 DIAGNOSIS — E782 Mixed hyperlipidemia: Secondary | ICD-10-CM

## 2025-01-02 DIAGNOSIS — I5033 Acute on chronic diastolic (congestive) heart failure: Secondary | ICD-10-CM

## 2025-01-02 DIAGNOSIS — I2585 Chronic coronary microvascular dysfunction: Secondary | ICD-10-CM

## 2025-01-02 DIAGNOSIS — R0602 Shortness of breath: Secondary | ICD-10-CM

## 2025-03-16 ENCOUNTER — Ambulatory Visit: Admitting: Cardiovascular Disease
